# Patient Record
Sex: Female | Born: 1945 | Race: White | Hispanic: No | Smoking: Current every day smoker
Health system: Southern US, Community
[De-identification: ages and names within clinical notes are randomized; demographics above are authoritative.]

## PROBLEM LIST (undated history)

## (undated) DIAGNOSIS — E669 Obesity, unspecified: Secondary | ICD-10-CM

## (undated) DIAGNOSIS — M51369 Other intervertebral disc degeneration, lumbar region without mention of lumbar back pain or lower extremity pain: Secondary | ICD-10-CM

## (undated) DIAGNOSIS — I4510 Unspecified right bundle-branch block: Secondary | ICD-10-CM

## (undated) DIAGNOSIS — G250 Essential tremor: Secondary | ICD-10-CM

## (undated) DIAGNOSIS — F329 Major depressive disorder, single episode, unspecified: Secondary | ICD-10-CM

## (undated) DIAGNOSIS — I509 Heart failure, unspecified: Secondary | ICD-10-CM

## (undated) DIAGNOSIS — R269 Unspecified abnormalities of gait and mobility: Secondary | ICD-10-CM

## (undated) DIAGNOSIS — E559 Vitamin D deficiency, unspecified: Secondary | ICD-10-CM

## (undated) DIAGNOSIS — M79606 Pain in leg, unspecified: Secondary | ICD-10-CM

## (undated) DIAGNOSIS — E785 Hyperlipidemia, unspecified: Secondary | ICD-10-CM

## (undated) DIAGNOSIS — G629 Polyneuropathy, unspecified: Secondary | ICD-10-CM

## (undated) DIAGNOSIS — K08109 Complete loss of teeth, unspecified cause, unspecified class: Secondary | ICD-10-CM

## (undated) DIAGNOSIS — B0229 Other postherpetic nervous system involvement: Secondary | ICD-10-CM

## (undated) DIAGNOSIS — K635 Polyp of colon: Secondary | ICD-10-CM

## (undated) DIAGNOSIS — R600 Localized edema: Secondary | ICD-10-CM

## (undated) DIAGNOSIS — I10 Essential (primary) hypertension: Secondary | ICD-10-CM

## (undated) DIAGNOSIS — I739 Peripheral vascular disease, unspecified: Secondary | ICD-10-CM

## (undated) DIAGNOSIS — Z972 Presence of dental prosthetic device (complete) (partial): Secondary | ICD-10-CM

## (undated) DIAGNOSIS — G4700 Insomnia, unspecified: Secondary | ICD-10-CM

## (undated) DIAGNOSIS — Z720 Tobacco use: Secondary | ICD-10-CM

## (undated) DIAGNOSIS — M1990 Unspecified osteoarthritis, unspecified site: Secondary | ICD-10-CM

## (undated) DIAGNOSIS — F419 Anxiety disorder, unspecified: Secondary | ICD-10-CM

## (undated) DIAGNOSIS — M5136 Other intervertebral disc degeneration, lumbar region: Secondary | ICD-10-CM

## (undated) DIAGNOSIS — F32A Depression, unspecified: Secondary | ICD-10-CM

## (undated) DIAGNOSIS — N3941 Urge incontinence: Secondary | ICD-10-CM

## (undated) DIAGNOSIS — R7303 Prediabetes: Secondary | ICD-10-CM

## (undated) DIAGNOSIS — J449 Chronic obstructive pulmonary disease, unspecified: Secondary | ICD-10-CM

## (undated) DIAGNOSIS — M48062 Spinal stenosis, lumbar region with neurogenic claudication: Secondary | ICD-10-CM

## (undated) DIAGNOSIS — J189 Pneumonia, unspecified organism: Secondary | ICD-10-CM

## (undated) HISTORY — PX: BACK SURGERY: SHX140

---

## 1970-06-02 HISTORY — PX: ABDOMINAL HYSTERECTOMY: SHX81

## 2005-02-04 ENCOUNTER — Ambulatory Visit (HOSPITAL_COMMUNITY): Admission: RE | Admit: 2005-02-04 | Discharge: 2005-02-04 | Payer: Self-pay | Admitting: General Surgery

## 2005-02-12 ENCOUNTER — Encounter: Admission: RE | Admit: 2005-02-12 | Discharge: 2005-05-13 | Payer: Self-pay | Admitting: General Surgery

## 2005-02-13 ENCOUNTER — Ambulatory Visit (HOSPITAL_COMMUNITY): Admission: RE | Admit: 2005-02-13 | Discharge: 2005-02-13 | Payer: Self-pay | Admitting: General Surgery

## 2005-03-27 ENCOUNTER — Ambulatory Visit (HOSPITAL_COMMUNITY): Admission: RE | Admit: 2005-03-27 | Discharge: 2005-03-27 | Payer: Self-pay | Admitting: General Surgery

## 2009-06-02 HISTORY — PX: BREAST BIOPSY: SHX20

## 2010-10-06 ENCOUNTER — Emergency Department: Payer: Self-pay | Admitting: Internal Medicine

## 2011-04-13 ENCOUNTER — Emergency Department: Payer: Self-pay | Admitting: *Deleted

## 2011-07-15 ENCOUNTER — Ambulatory Visit: Payer: Self-pay | Admitting: Unknown Physician Specialty

## 2011-07-16 LAB — PATHOLOGY REPORT

## 2011-08-11 ENCOUNTER — Emergency Department: Payer: Self-pay | Admitting: Unknown Physician Specialty

## 2011-08-12 LAB — CBC WITH DIFFERENTIAL/PLATELET
Basophil %: 0.1 %
Eosinophil %: 0.4 %
HCT: 40.9 % (ref 35.0–47.0)
HGB: 13.9 g/dL (ref 12.0–16.0)
Lymphocyte #: 0.4 10*3/uL — ABNORMAL LOW (ref 1.0–3.6)
MCV: 87 fL (ref 80–100)
Monocyte %: 2.5 %
Neutrophil #: 4 10*3/uL (ref 1.4–6.5)
Platelet: 185 10*3/uL (ref 150–440)
RBC: 4.73 10*6/uL (ref 3.80–5.20)
WBC: 4.6 10*3/uL (ref 3.6–11.0)

## 2011-08-12 LAB — COMPREHENSIVE METABOLIC PANEL
Alkaline Phosphatase: 108 U/L (ref 50–136)
BUN: 13 mg/dL (ref 7–18)
Chloride: 100 mmol/L (ref 98–107)
Co2: 27 mmol/L (ref 21–32)
EGFR (African American): >60
EGFR (Non-African Amer.): >60
SGOT(AST): 24 U/L (ref 15–37)
SGPT (ALT): 27 U/L
Total Protein: 7.4 g/dL (ref 6.4–8.2)

## 2011-08-12 LAB — MAGNESIUM: Magnesium: 1.6 mg/dL — ABNORMAL LOW

## 2011-09-18 ENCOUNTER — Ambulatory Visit: Payer: Self-pay | Admitting: Internal Medicine

## 2012-09-20 ENCOUNTER — Ambulatory Visit: Payer: Self-pay | Admitting: Internal Medicine

## 2012-10-08 ENCOUNTER — Emergency Department: Payer: Self-pay | Admitting: Unknown Physician Specialty

## 2013-06-02 ENCOUNTER — Emergency Department: Payer: Self-pay | Admitting: Emergency Medicine

## 2013-06-02 LAB — TROPONIN I
Troponin-I: <0.02 ng/mL
Troponin-I: <0.02 ng/mL

## 2013-06-02 LAB — CBC
HCT: 39.9 % (ref 35.0–47.0)
HGB: 13.7 g/dL (ref 12.0–16.0)
MCH: 29.3 pg (ref 26.0–34.0)
MCHC: 34.4 g/dL (ref 32.0–36.0)
MCV: 85 fL (ref 80–100)
Platelet: 183 10*3/uL (ref 150–440)
RBC: 4.69 10*6/uL (ref 3.80–5.20)
RDW: 13.6 % (ref 11.5–14.5)
WBC: 4.6 10*3/uL (ref 3.6–11.0)

## 2013-06-02 LAB — BASIC METABOLIC PANEL
Anion Gap: 4 — ABNORMAL LOW (ref 7–16)
BUN: 8 mg/dL (ref 7–18)
CHLORIDE: 100 mmol/L (ref 98–107)
Calcium, Total: 8.8 mg/dL (ref 8.5–10.1)
Co2: 28 mmol/L (ref 21–32)
Creatinine: 0.67 mg/dL (ref 0.60–1.30)
EGFR (African American): >60
GLUCOSE: 88 mg/dL (ref 65–99)
Osmolality: 262 (ref 275–301)
POTASSIUM: 3.6 mmol/L (ref 3.5–5.1)
Sodium: 132 mmol/L — ABNORMAL LOW (ref 136–145)

## 2013-06-02 LAB — RAPID INFLUENZA A&B ANTIGENS (ARMC ONLY)

## 2013-06-02 LAB — PRO B NATRIURETIC PEPTIDE: B-TYPE NATIURETIC PEPTID: 272 pg/mL — AB (ref 0–125)

## 2013-09-01 ENCOUNTER — Emergency Department: Payer: Self-pay | Admitting: Emergency Medicine

## 2013-11-23 ENCOUNTER — Ambulatory Visit: Payer: Self-pay | Admitting: Internal Medicine

## 2013-12-05 DIAGNOSIS — M47816 Spondylosis without myelopathy or radiculopathy, lumbar region: Secondary | ICD-10-CM | POA: Insufficient documentation

## 2013-12-14 ENCOUNTER — Ambulatory Visit: Payer: Self-pay | Admitting: Unknown Physician Specialty

## 2014-01-02 ENCOUNTER — Ambulatory Visit: Payer: Self-pay | Admitting: Physical Medicine and Rehabilitation

## 2014-01-06 DIAGNOSIS — I10 Essential (primary) hypertension: Secondary | ICD-10-CM | POA: Insufficient documentation

## 2014-01-06 DIAGNOSIS — E669 Obesity, unspecified: Secondary | ICD-10-CM | POA: Insufficient documentation

## 2014-01-06 DIAGNOSIS — I739 Peripheral vascular disease, unspecified: Secondary | ICD-10-CM | POA: Insufficient documentation

## 2014-01-06 DIAGNOSIS — F325 Major depressive disorder, single episode, in full remission: Secondary | ICD-10-CM | POA: Insufficient documentation

## 2014-05-02 DIAGNOSIS — J189 Pneumonia, unspecified organism: Secondary | ICD-10-CM

## 2014-05-02 HISTORY — DX: Pneumonia, unspecified organism: J18.9

## 2014-06-26 ENCOUNTER — Emergency Department: Payer: Self-pay | Admitting: Emergency Medicine

## 2014-06-26 LAB — BASIC METABOLIC PANEL
Anion Gap: 6 — ABNORMAL LOW (ref 7–16)
BUN: 14 mg/dL (ref 7–18)
CALCIUM: 9.3 mg/dL (ref 8.5–10.1)
CHLORIDE: 99 mmol/L (ref 98–107)
Co2: 32 mmol/L (ref 21–32)
Creatinine: 0.68 mg/dL (ref 0.60–1.30)
EGFR (African American): >60
EGFR (Non-African Amer.): >60
Glucose: 117 mg/dL — ABNORMAL HIGH (ref 65–99)
Osmolality: 275 (ref 275–301)
POTASSIUM: 3.1 mmol/L — AB (ref 3.5–5.1)
Sodium: 137 mmol/L (ref 136–145)

## 2014-06-26 LAB — CBC
HCT: 46.2 % (ref 35.0–47.0)
HGB: 15.2 g/dL (ref 12.0–16.0)
MCH: 29 pg (ref 26.0–34.0)
MCHC: 32.9 g/dL (ref 32.0–36.0)
MCV: 88 fL (ref 80–100)
Platelet: 257 10*3/uL (ref 150–440)
RBC: 5.24 10*6/uL — AB (ref 3.80–5.20)
RDW: 13.6 % (ref 11.5–14.5)
WBC: 5.4 10*3/uL (ref 3.6–11.0)

## 2014-06-26 LAB — MAGNESIUM: Magnesium: 2 mg/dL

## 2014-06-28 DIAGNOSIS — R600 Localized edema: Secondary | ICD-10-CM | POA: Insufficient documentation

## 2014-07-17 ENCOUNTER — Ambulatory Visit: Payer: Self-pay | Admitting: Physical Medicine and Rehabilitation

## 2014-07-26 ENCOUNTER — Other Ambulatory Visit (HOSPITAL_COMMUNITY): Payer: Self-pay | Admitting: Neurosurgery

## 2014-08-14 ENCOUNTER — Encounter (HOSPITAL_COMMUNITY): Payer: Self-pay

## 2014-08-14 ENCOUNTER — Encounter (HOSPITAL_COMMUNITY)
Admission: RE | Admit: 2014-08-14 | Discharge: 2014-08-14 | Disposition: A | Discharge status: Home / Self Care | Payer: Commercial Managed Care - HMO | Source: Ambulatory Visit | Attending: Neurosurgery | Admitting: Neurosurgery

## 2014-08-14 HISTORY — DX: Depression, unspecified: F32.A

## 2014-08-14 HISTORY — DX: Unspecified osteoarthritis, unspecified site: M19.90

## 2014-08-14 HISTORY — DX: Pneumonia, unspecified organism: J18.9

## 2014-08-14 HISTORY — DX: Essential (primary) hypertension: I10

## 2014-08-14 HISTORY — DX: Major depressive disorder, single episode, unspecified: F32.9

## 2014-08-14 HISTORY — DX: Heart failure, unspecified: I50.9

## 2014-08-14 LAB — SURGICAL PCR SCREEN
MRSA, PCR: POSITIVE — AB
STAPHYLOCOCCUS AUREUS: POSITIVE — AB

## 2014-08-14 LAB — CBC
HEMATOCRIT: 43.9 % (ref 36.0–46.0)
HEMOGLOBIN: 14.5 g/dL (ref 12.0–15.0)
MCH: 29.2 pg (ref 26.0–34.0)
MCHC: 33 g/dL (ref 30.0–36.0)
MCV: 88.3 fL (ref 78.0–100.0)
Platelets: 274 10*3/uL (ref 150–400)
RBC: 4.97 MIL/uL (ref 3.87–5.11)
RDW: 13.7 % (ref 11.5–15.5)
WBC: 7.9 10*3/uL (ref 4.0–10.5)

## 2014-08-14 NOTE — Progress Notes (Addendum)
Anesthesia Chart Review:  Pt is 69 year old female scheduled for L3-4, L4-5 lumbar laminectomy/decompression/microdiscectomy on 08/23/2014 with Dr. Arnoldo Morale.   Cardiologist is Dr. Saralyn Pilar at [place]. PCP is Dr. Ouida Sills at [place].   PMH includes: HTN, diastolic CHF. Current smoker. BMI 34.   Preoperative labs reviewed.  BMET hemolyzed, will be repeated DOS.   EKG: pending from Dr. Saralyn Pilar' office.   Echo 06/28/2014 (in care everywhere): -normal LV systolic function with estimated EF = 55% -normal RV systolic function -mild mitral valve insufficiency -no valvular stenosis  Willeen Cass, FNP-BC [facility] Stay [facility]/Anesthesiology Phone: [phone] 08/14/2014 4:38 PM  EKG obtained from Dr. Tonette Bihari office.   EKG 05/31/2014: sinus rhythm with PACs. RBBB.   If no acute changes, and if BMET stable DOS, I anticipate pt can proceed with surgery as scheduled.   Willeen Cass, FNP-BC [facility] Short Stay Surgical Center/Anesthesiology Phone: [phone] 08/15/2014 1:44 PM

## 2014-08-14 NOTE — Progress Notes (Signed)
Req"d ekg, cardiac tests,office notes from [facility] clinic [place] dr Jennye Moccasin pcp dr Ouida Sills notes on chart

## 2014-08-14 NOTE — Pre-Procedure Instructions (Addendum)
Brittany Burke  08/14/2014   Your procedure is scheduled on:  08/23/14  Report to [facility] short stay admitting at 910 AM.  Call this number if you have problems the morning of surgery: [phone]   Remember:   Do not eat food or drink liquids after midnight.   Take these medicines the morning of surgery with A SIP OF WATER: amlodipine(norvasc), cymbalta, gabapentin, pain med if needed     STOP all herbel meds, nsaids (aleve,naproxen,advil,ibuprofen) 5 days prior to surgery starting 08/18/14 including vitamins, aspirin   Do not wear jewelry, make-up or nail polish.  Do not wear lotions, powders, or perfumes. You may wear deodorant.  Do not shave 48 hours prior to surgery. Men may shave face and neck.  Do not bring valuables to the hospital.  [facility] is not responsible                  for any belongings or valuables.               Contacts, dentures or bridgework may not be worn into surgery.  Leave suitcase in the car. After surgery it may be brought to your room.  For patients admitted to the hospital, discharge time is determined by your                treatment team.               Patients discharged the day of surgery will not be allowed to drive  home.  Name and phone number of your driver:   Special Instructions:  Special Instructions: [name] - Preparing for Surgery  Before surgery, you can play an important role.  Because skin is not sterile, your skin needs to be as free of germs as possible.  You can reduce the number of germs on you skin by washing with CHG (chlorahexidine gluconate) soap before surgery.  CHG is an antiseptic cleaner which kills germs and bonds with the skin to continue killing germs even after washing.  Please DO NOT use if you have an allergy to CHG or antibacterial soaps.  If your skin becomes reddened/irritated stop using the CHG and inform your nurse when you arrive at Short Stay.  Do not shave (including legs and underarms) for at least 48  hours prior to the first CHG shower.  You may shave your face.  Please follow these instructions carefully:   1.  Shower with CHG Soap the night before surgery and the morning of Surgery.  2.  If you choose to wash your hair, wash your hair first as usual with your normal shampoo.  3.  After you shampoo, rinse your hair and body thoroughly to remove the Shampoo.  4.  Use CHG as you would any other liquid soap.  You can apply chg directly  to the skin and wash gently with scrungie or a clean washcloth.  5.  Apply the CHG Soap to your body ONLY FROM THE NECK DOWN.  Do not use on open wounds or open sores.  Avoid contact with your eyes ears, mouth and genitals (private parts).  Wash genitals (private parts)       with your normal soap.  6.  Wash thoroughly, paying special attention to the area where your surgery will be performed.  7.  Thoroughly rinse your body with warm water from the neck down.  8.  DO NOT shower/wash with your normal soap after using and rinsing off  the CHG Soap.  9.  Pat yourself dry with a clean towel.            10.  Wear clean pajamas.            11.  Place clean sheets on your bed the night of your first shower and do not sleep with pets.  Day of Surgery  Do not apply any lotions/deodorants the morning of surgery.  Please wear clean clothes to the hospital/surgery center.   Please read over the following fact sheets that you were given: Pain Booklet, Coughing and Deep Breathing, MRSA Information and Surgical Site Infection Prevention

## 2014-08-22 MED ORDER — CEFAZOLIN SODIUM-DEXTROSE 2-3 GM-% IV SOLR
2.0000 g | INTRAVENOUS | Status: AC
  Administered 2014-08-23: 2 g via INTRAVENOUS
  Filled 2014-08-22: qty 50

## 2014-08-23 ENCOUNTER — Ambulatory Visit (HOSPITAL_COMMUNITY): Payer: Commercial Managed Care - HMO | Admitting: Anesthesiology

## 2014-08-23 ENCOUNTER — Encounter (HOSPITAL_COMMUNITY): Payer: Self-pay | Admitting: *Deleted

## 2014-08-23 ENCOUNTER — Ambulatory Visit (HOSPITAL_COMMUNITY): Payer: Commercial Managed Care - HMO

## 2014-08-23 ENCOUNTER — Inpatient Hospital Stay (HOSPITAL_COMMUNITY)
Admission: RE | Admit: 2014-08-23 | Discharge: 2014-08-29 | DRG: 519 | Disposition: A | Discharge status: Skilled Nursing Facility | Payer: Commercial Managed Care - HMO | Source: Ambulatory Visit | Attending: Neurosurgery | Admitting: Neurosurgery

## 2014-08-23 ENCOUNTER — Encounter (HOSPITAL_COMMUNITY)
Admission: RE | Disposition: A | Discharge status: Skilled Nursing Facility | Payer: Self-pay | Source: Ambulatory Visit | Attending: Neurosurgery

## 2014-08-23 ENCOUNTER — Ambulatory Visit (HOSPITAL_COMMUNITY): Payer: Commercial Managed Care - HMO | Admitting: Emergency Medicine

## 2014-08-23 DIAGNOSIS — F17210 Nicotine dependence, cigarettes, uncomplicated: Secondary | ICD-10-CM | POA: Diagnosis present

## 2014-08-23 DIAGNOSIS — Z79899 Other long term (current) drug therapy: Secondary | ICD-10-CM

## 2014-08-23 DIAGNOSIS — M4800 Spinal stenosis, site unspecified: Secondary | ICD-10-CM

## 2014-08-23 DIAGNOSIS — M4806 Spinal stenosis, lumbar region: Principal | ICD-10-CM | POA: Diagnosis present

## 2014-08-23 DIAGNOSIS — I5032 Chronic diastolic (congestive) heart failure: Secondary | ICD-10-CM | POA: Diagnosis present

## 2014-08-23 DIAGNOSIS — G9741 Accidental puncture or laceration of dura during a procedure: Secondary | ICD-10-CM | POA: Diagnosis not present

## 2014-08-23 DIAGNOSIS — M1990 Unspecified osteoarthritis, unspecified site: Secondary | ICD-10-CM | POA: Diagnosis present

## 2014-08-23 DIAGNOSIS — Y92234 Operating room of hospital as the place of occurrence of the external cause: Secondary | ICD-10-CM

## 2014-08-23 DIAGNOSIS — M48062 Spinal stenosis, lumbar region with neurogenic claudication: Secondary | ICD-10-CM | POA: Diagnosis present

## 2014-08-23 DIAGNOSIS — Z8701 Personal history of pneumonia (recurrent): Secondary | ICD-10-CM

## 2014-08-23 DIAGNOSIS — M5416 Radiculopathy, lumbar region: Secondary | ICD-10-CM | POA: Diagnosis present

## 2014-08-23 DIAGNOSIS — Z90710 Acquired absence of both cervix and uterus: Secondary | ICD-10-CM

## 2014-08-23 DIAGNOSIS — I10 Essential (primary) hypertension: Secondary | ICD-10-CM | POA: Diagnosis present

## 2014-08-23 DIAGNOSIS — M5106 Intervertebral disc disorders with myelopathy, lumbar region: Secondary | ICD-10-CM | POA: Diagnosis present

## 2014-08-23 DIAGNOSIS — F329 Major depressive disorder, single episode, unspecified: Secondary | ICD-10-CM | POA: Diagnosis present

## 2014-08-23 DIAGNOSIS — Y658 Other specified misadventures during surgical and medical care: Secondary | ICD-10-CM | POA: Diagnosis present

## 2014-08-23 HISTORY — PX: LUMBAR LAMINECTOMY/DECOMPRESSION MICRODISCECTOMY: SHX5026

## 2014-08-23 LAB — BASIC METABOLIC PANEL
ANION GAP: 11 (ref 5–15)
BUN: 9 mg/dL (ref 6–23)
CALCIUM: 9.3 mg/dL (ref 8.4–10.5)
CO2: 28 mmol/L (ref 19–32)
Chloride: 99 mmol/L (ref 96–112)
Creatinine, Ser: 0.55 mg/dL (ref 0.50–1.10)
GLUCOSE: 85 mg/dL (ref 70–99)
POTASSIUM: 3.9 mmol/L (ref 3.5–5.1)
SODIUM: 138 mmol/L (ref 135–145)

## 2014-08-23 LAB — GLUCOSE, CAPILLARY: GLUCOSE-CAPILLARY: 148 mg/dL — AB (ref 70–99)

## 2014-08-23 SURGERY — LUMBAR LAMINECTOMY/DECOMPRESSION MICRODISCECTOMY 2 LEVELS
Anesthesia: General | Site: Spine Lumbar

## 2014-08-23 MED ORDER — DULOXETINE HCL 60 MG PO CPEP
60.0000 mg | ORAL_CAPSULE | Freq: Two times a day (BID) | ORAL | Status: DC
  Administered 2014-08-24 – 2014-08-29 (×11): 60 mg via ORAL
  Filled 2014-08-23 (×3): qty 1
  Filled 2014-08-23: qty 2
  Filled 2014-08-23 (×8): qty 1

## 2014-08-23 MED ORDER — HYDROCODONE-ACETAMINOPHEN 5-325 MG PO TABS
1.0000 | ORAL_TABLET | ORAL | Status: DC | PRN
  Administered 2014-08-27 – 2014-08-29 (×2): 2 via ORAL
  Filled 2014-08-23 (×2): qty 2

## 2014-08-23 MED ORDER — MORPHINE SULFATE 2 MG/ML IJ SOLN: 1.0000 mg | INTRAMUSCULAR | Status: DC | PRN

## 2014-08-23 MED ORDER — DOCUSATE SODIUM 100 MG PO CAPS
100.0000 mg | ORAL_CAPSULE | Freq: Two times a day (BID) | ORAL | Status: DC
  Administered 2014-08-23 – 2014-08-29 (×12): 100 mg via ORAL
  Filled 2014-08-23 (×12): qty 1

## 2014-08-23 MED ORDER — LIDOCAINE HCL (CARDIAC) 20 MG/ML IV SOLN
INTRAVENOUS | Status: AC
  Filled 2014-08-23: qty 5

## 2014-08-23 MED ORDER — ARTIFICIAL TEARS OP OINT
TOPICAL_OINTMENT | OPHTHALMIC | Status: DC | PRN
  Administered 2014-08-23: 1 via OPHTHALMIC

## 2014-08-23 MED ORDER — LACTATED RINGERS IV SOLN
Freq: Once | INTRAVENOUS | Status: AC
  Administered 2014-08-23: 11:00:00 via INTRAVENOUS

## 2014-08-23 MED ORDER — BUPIVACAINE-EPINEPHRINE (PF) 0.5% -1:200000 IJ SOLN
INTRAMUSCULAR | Status: DC | PRN
  Administered 2014-08-23: 10 mL via PERINEURAL

## 2014-08-23 MED ORDER — ROCURONIUM BROMIDE 50 MG/5ML IV SOLN
INTRAVENOUS | Status: AC
  Filled 2014-08-23: qty 1

## 2014-08-23 MED ORDER — BUPIVACAINE LIPOSOME 1.3 % IJ SUSP
20.0000 mL | Freq: Once | INTRAMUSCULAR | Status: DC
Start: 2014-08-23 — End: 2014-08-28
  Filled 2014-08-23: qty 20

## 2014-08-23 MED ORDER — BACITRACIN ZINC 500 UNIT/GM EX OINT
TOPICAL_OINTMENT | CUTANEOUS | Status: DC | PRN
  Administered 2014-08-23: 1 via TOPICAL

## 2014-08-23 MED ORDER — TRAZODONE HCL 100 MG PO TABS
200.0000 mg | ORAL_TABLET | Freq: Every day | ORAL | Status: DC
  Administered 2014-08-23 – 2014-08-28 (×6): 200 mg via ORAL
  Filled 2014-08-23: qty 2
  Filled 2014-08-23: qty 4
  Filled 2014-08-23 (×4): qty 2

## 2014-08-23 MED ORDER — PROPOFOL 10 MG/ML IV BOLUS
INTRAVENOUS | Status: DC | PRN
  Administered 2014-08-23: 150 mg via INTRAVENOUS

## 2014-08-23 MED ORDER — LACTATED RINGERS IV SOLN
INTRAVENOUS | Status: DC
Start: 2014-08-23 — End: 2014-08-29
  Administered 2014-08-23: 22:00:00 via INTRAVENOUS

## 2014-08-23 MED ORDER — AMLODIPINE BESYLATE 5 MG PO TABS
5.0000 mg | ORAL_TABLET | Freq: Every day | ORAL | Status: DC
  Administered 2014-08-25 – 2014-08-29 (×4): 5 mg via ORAL
  Filled 2014-08-23 (×6): qty 1

## 2014-08-23 MED ORDER — PHENOL 1.4 % MT LIQD: 1.0000 | OROMUCOSAL | Status: DC | PRN

## 2014-08-23 MED ORDER — BISACODYL 10 MG RE SUPP
10.0000 mg | Freq: Every day | RECTAL | Status: DC | PRN
  Administered 2014-08-27: 10 mg via RECTAL
  Filled 2014-08-23: qty 1

## 2014-08-23 MED ORDER — ONDANSETRON HCL 4 MG/2ML IJ SOLN: 4.0000 mg | INTRAMUSCULAR | Status: DC | PRN

## 2014-08-23 MED ORDER — FENTANYL CITRATE 0.05 MG/ML IJ SOLN
INTRAMUSCULAR | Status: AC
  Filled 2014-08-23: qty 5

## 2014-08-23 MED ORDER — PHENYLEPHRINE 40 MCG/ML (10ML) SYRINGE FOR IV PUSH (FOR BLOOD PRESSURE SUPPORT)
PREFILLED_SYRINGE | INTRAVENOUS | Status: AC
  Filled 2014-08-23: qty 10

## 2014-08-23 MED ORDER — GABAPENTIN 100 MG PO CAPS
100.0000 mg | ORAL_CAPSULE | Freq: Three times a day (TID) | ORAL | Status: DC
  Administered 2014-08-23 – 2014-08-29 (×18): 100 mg via ORAL
  Filled 2014-08-23 (×18): qty 1

## 2014-08-23 MED ORDER — MENTHOL 3 MG MT LOZG: 1.0000 | LOZENGE | OROMUCOSAL | Status: DC | PRN

## 2014-08-23 MED ORDER — HEMOSTATIC AGENTS (NO CHARGE) OPTIME
TOPICAL | Status: DC | PRN
  Administered 2014-08-23: 1 via TOPICAL

## 2014-08-23 MED ORDER — ACETAMINOPHEN 650 MG RE SUPP: 650.0000 mg | RECTAL | Status: DC | PRN

## 2014-08-23 MED ORDER — LIDOCAINE HCL (CARDIAC) 20 MG/ML IV SOLN
INTRAVENOUS | Status: DC | PRN
  Administered 2014-08-23: 70 mg via INTRAVENOUS

## 2014-08-23 MED ORDER — THROMBIN 5000 UNITS EX SOLR
CUTANEOUS | Status: DC | PRN
  Administered 2014-08-23 (×2): 5000 [IU] via TOPICAL

## 2014-08-23 MED ORDER — ROCURONIUM BROMIDE 100 MG/10ML IV SOLN
INTRAVENOUS | Status: DC | PRN
  Administered 2014-08-23: 40 mg via INTRAVENOUS

## 2014-08-23 MED ORDER — ACETAMINOPHEN 325 MG PO TABS
650.0000 mg | ORAL_TABLET | ORAL | Status: DC | PRN
  Administered 2014-08-24: 650 mg via ORAL
  Filled 2014-08-23: qty 2

## 2014-08-23 MED ORDER — HYDROMORPHONE HCL 1 MG/ML IJ SOLN
INTRAMUSCULAR | Status: AC
  Administered 2014-08-23: 0.5 mg via INTRAVENOUS
  Filled 2014-08-23: qty 2

## 2014-08-23 MED ORDER — PROMETHAZINE HCL 25 MG/ML IJ SOLN
6.2500 mg | INTRAMUSCULAR | Status: DC | PRN
Start: 2014-08-23 — End: 2014-08-23

## 2014-08-23 MED ORDER — OXYCODONE-ACETAMINOPHEN 5-325 MG PO TABS
1.0000 | ORAL_TABLET | ORAL | Status: DC | PRN
  Administered 2014-08-23 – 2014-08-29 (×24): 2 via ORAL
  Filled 2014-08-23 (×25): qty 2

## 2014-08-23 MED ORDER — SODIUM CHLORIDE 0.9 % IR SOLN
Status: DC | PRN
  Administered 2014-08-23: 14:00:00

## 2014-08-23 MED ORDER — ALUM & MAG HYDROXIDE-SIMETH 200-200-20 MG/5ML PO SUSP: 30.0000 mL | Freq: Four times a day (QID) | ORAL | Status: DC | PRN

## 2014-08-23 MED ORDER — FENTANYL CITRATE 0.05 MG/ML IJ SOLN
INTRAMUSCULAR | Status: DC | PRN
  Administered 2014-08-23 (×3): 50 ug via INTRAVENOUS
  Administered 2014-08-23: 100 ug via INTRAVENOUS

## 2014-08-23 MED ORDER — LACTATED RINGERS IV SOLN
INTRAVENOUS | Status: DC | PRN
  Administered 2014-08-23 (×2): via INTRAVENOUS

## 2014-08-23 MED ORDER — BUPIVACAINE LIPOSOME 1.3 % IJ SUSP
INTRAMUSCULAR | Status: DC | PRN
  Administered 2014-08-23: 20 mL

## 2014-08-23 MED ORDER — CEFAZOLIN SODIUM-DEXTROSE 2-3 GM-% IV SOLR
2.0000 g | Freq: Three times a day (TID) | INTRAVENOUS | Status: AC
  Administered 2014-08-23 – 2014-08-24 (×2): 2 g via INTRAVENOUS
  Filled 2014-08-23 (×2): qty 50

## 2014-08-23 MED ORDER — ONDANSETRON HCL 4 MG/2ML IJ SOLN
INTRAMUSCULAR | Status: DC | PRN
  Administered 2014-08-23: 4 mg via INTRAVENOUS

## 2014-08-23 MED ORDER — PROPOFOL 10 MG/ML IV BOLUS
INTRAVENOUS | Status: AC
  Filled 2014-08-23: qty 20

## 2014-08-23 MED ORDER — DIAZEPAM 5 MG PO TABS
5.0000 mg | ORAL_TABLET | Freq: Four times a day (QID) | ORAL | Status: DC | PRN
  Administered 2014-08-23 – 2014-08-27 (×6): 5 mg via ORAL
  Filled 2014-08-23 (×5): qty 1

## 2014-08-23 MED ORDER — MIDAZOLAM HCL 5 MG/5ML IJ SOLN
INTRAMUSCULAR | Status: DC | PRN
  Administered 2014-08-23 (×2): 1 mg via INTRAVENOUS

## 2014-08-23 MED ORDER — GLYCOPYRROLATE 0.2 MG/ML IJ SOLN
INTRAMUSCULAR | Status: AC
  Filled 2014-08-23: qty 3

## 2014-08-23 MED ORDER — DIAZEPAM 5 MG PO TABS
ORAL_TABLET | ORAL | Status: AC
  Filled 2014-08-23: qty 1

## 2014-08-23 MED ORDER — POTASSIUM CHLORIDE CRYS ER 10 MEQ PO TBCR
10.0000 meq | EXTENDED_RELEASE_TABLET | Freq: Every day | ORAL | Status: DC
  Administered 2014-08-24 – 2014-08-29 (×6): 10 meq via ORAL
  Filled 2014-08-23 (×6): qty 1

## 2014-08-23 MED ORDER — 0.9 % SODIUM CHLORIDE (POUR BTL) OPTIME
TOPICAL | Status: DC | PRN
  Administered 2014-08-23: 1000 mL

## 2014-08-23 MED ORDER — GLYCOPYRROLATE 0.2 MG/ML IJ SOLN
INTRAMUSCULAR | Status: DC | PRN
  Administered 2014-08-23: 0.1 mg via INTRAVENOUS
  Administered 2014-08-23: 0.3 mg via INTRAVENOUS

## 2014-08-23 MED ORDER — FLEET ENEMA 7-19 GM/118ML RE ENEM: 1.0000 | ENEMA | Freq: Once | RECTAL | Status: AC | PRN

## 2014-08-23 MED ORDER — PHENYLEPHRINE HCL 10 MG/ML IJ SOLN
INTRAMUSCULAR | Status: DC | PRN
  Administered 2014-08-23: 80 ug via INTRAVENOUS

## 2014-08-23 MED ORDER — MIDAZOLAM HCL 2 MG/2ML IJ SOLN
INTRAMUSCULAR | Status: AC
  Filled 2014-08-23: qty 2

## 2014-08-23 MED ORDER — NEOSTIGMINE METHYLSULFATE 10 MG/10ML IV SOLN
INTRAVENOUS | Status: DC | PRN
  Administered 2014-08-23: 1 mg via INTRAVENOUS
  Administered 2014-08-23: 2 mg via INTRAVENOUS

## 2014-08-23 MED ORDER — HYDROMORPHONE HCL 1 MG/ML IJ SOLN
0.2500 mg | INTRAMUSCULAR | Status: DC | PRN
  Administered 2014-08-23 (×4): 0.5 mg via INTRAVENOUS

## 2014-08-23 MED ORDER — HYDROCHLOROTHIAZIDE 25 MG PO TABS
25.0000 mg | ORAL_TABLET | Freq: Every day | ORAL | Status: DC
  Administered 2014-08-24 – 2014-08-29 (×6): 25 mg via ORAL
  Filled 2014-08-23 (×7): qty 1

## 2014-08-23 MED ORDER — POTASSIUM GLUCONATE 595 (99 K) MG PO TABS: 595.0000 mg | ORAL_TABLET | Freq: Every day | ORAL | Status: DC

## 2014-08-23 SURGICAL SUPPLY — 61 items
APL SKNCLS STERI-STRIP NONHPOA (GAUZE/BANDAGES/DRESSINGS) ×1
APL SRG 60D 8 XTD TIP BNDBL (TIP) ×1
BAG DECANTER FOR FLEXI CONT (MISCELLANEOUS) ×3 IMPLANT
BENZOIN TINCTURE PRP APPL 2/3 (GAUZE/BANDAGES/DRESSINGS) ×3 IMPLANT
BLADE CLIPPER SURG (BLADE) IMPLANT
BRUSH SCRUB EZ PLAIN DRY (MISCELLANEOUS) ×3 IMPLANT
BUR MATCHSTICK NEURO 3.0 LAGG (BURR) ×3 IMPLANT
BUR PRECISION FLUTE 6.0 (BURR) ×3 IMPLANT
CANISTER SUCT 3000ML PPV (MISCELLANEOUS) ×3 IMPLANT
CLOSURE WOUND 1/2 X4 (GAUZE/BANDAGES/DRESSINGS) ×1
CONT SPEC 4OZ CLIKSEAL STRL BL (MISCELLANEOUS) ×3 IMPLANT
DRAPE LAPAROTOMY 100X72X124 (DRAPES) ×3 IMPLANT
DRAPE MICROSCOPE LEICA (MISCELLANEOUS) ×3 IMPLANT
DRAPE POUCH INSTRU U-SHP 10X18 (DRAPES) ×3 IMPLANT
DRAPE SURG 17X23 STRL (DRAPES) ×12 IMPLANT
DURASEAL APPLICATOR TIP (TIP) ×2 IMPLANT
DURASEAL SPINE SEALANT 3ML (MISCELLANEOUS) ×2 IMPLANT
ELECT BLADE 4.0 EZ CLEAN MEGAD (MISCELLANEOUS) ×3
ELECT REM PT RETURN 9FT ADLT (ELECTROSURGICAL) ×3
ELECTRODE BLDE 4.0 EZ CLN MEGD (MISCELLANEOUS) ×1 IMPLANT
ELECTRODE REM PT RTRN 9FT ADLT (ELECTROSURGICAL) ×1 IMPLANT
EVACUATOR 1/8 PVC DRAIN (DRAIN) ×2 IMPLANT
GAUZE SPONGE 4X4 12PLY STRL (GAUZE/BANDAGES/DRESSINGS) ×3 IMPLANT
GAUZE SPONGE 4X4 16PLY XRAY LF (GAUZE/BANDAGES/DRESSINGS) ×2 IMPLANT
GLOVE BIO SURGEON STRL SZ8 (GLOVE) ×5 IMPLANT
GLOVE BIO SURGEON STRL SZ8.5 (GLOVE) ×5 IMPLANT
GLOVE BIOGEL PI IND STRL 7.0 (GLOVE) IMPLANT
GLOVE BIOGEL PI INDICATOR 7.0 (GLOVE) ×6
GLOVE ECLIPSE 6.5 STRL STRAW (GLOVE) ×4 IMPLANT
GLOVE EXAM NITRILE LRG STRL (GLOVE) IMPLANT
GLOVE EXAM NITRILE MD LF STRL (GLOVE) IMPLANT
GLOVE EXAM NITRILE XL STR (GLOVE) IMPLANT
GLOVE EXAM NITRILE XS STR PU (GLOVE) IMPLANT
GOWN STRL REUS W/ TWL LRG LVL3 (GOWN DISPOSABLE) IMPLANT
GOWN STRL REUS W/ TWL XL LVL3 (GOWN DISPOSABLE) ×1 IMPLANT
GOWN STRL REUS W/TWL 2XL LVL3 (GOWN DISPOSABLE) IMPLANT
GOWN STRL REUS W/TWL LRG LVL3 (GOWN DISPOSABLE) ×3
GOWN STRL REUS W/TWL XL LVL3 (GOWN DISPOSABLE) ×6
KIT BASIN OR (CUSTOM PROCEDURE TRAY) ×3 IMPLANT
KIT ROOM TURNOVER OR (KITS) ×3 IMPLANT
NDL HYPO 21X1.5 SAFETY (NEEDLE) IMPLANT
NEEDLE HYPO 21X1.5 SAFETY (NEEDLE) IMPLANT
NEEDLE HYPO 22GX1.5 SAFETY (NEEDLE) ×3 IMPLANT
NS IRRIG 1000ML POUR BTL (IV SOLUTION) ×3 IMPLANT
PACK LAMINECTOMY NEURO (CUSTOM PROCEDURE TRAY) ×3 IMPLANT
PAD ARMBOARD 7.5X6 YLW CONV (MISCELLANEOUS) ×9 IMPLANT
PATTIES SURGICAL .5 X1 (DISPOSABLE) ×2 IMPLANT
PATTIES SURGICAL 1X1 (DISPOSABLE) ×2 IMPLANT
RUBBERBAND STERILE (MISCELLANEOUS) ×6 IMPLANT
SPONGE SURGIFOAM ABS GEL SZ50 (HEMOSTASIS) ×3 IMPLANT
STRIP CLOSURE SKIN 1/2X4 (GAUZE/BANDAGES/DRESSINGS) ×2 IMPLANT
SUT PROLENE 6 0 BV (SUTURE) ×8 IMPLANT
SUT VIC AB 1 CT1 18XBRD ANBCTR (SUTURE) ×2 IMPLANT
SUT VIC AB 1 CT1 8-18 (SUTURE) ×6
SUT VIC AB 2-0 CP2 18 (SUTURE) ×6 IMPLANT
SYR 20CC LL (SYRINGE) IMPLANT
SYR 20ML ECCENTRIC (SYRINGE) ×3 IMPLANT
TAPE CLOTH SURG 4X10 WHT LF (GAUZE/BANDAGES/DRESSINGS) ×2 IMPLANT
TOWEL OR 17X24 6PK STRL BLUE (TOWEL DISPOSABLE) ×3 IMPLANT
TOWEL OR 17X26 10 PK STRL BLUE (TOWEL DISPOSABLE) ×3 IMPLANT
WATER STERILE IRR 1000ML POUR (IV SOLUTION) ×3 IMPLANT

## 2014-08-23 NOTE — Transfer of Care (Signed)
Immediate Anesthesia Transfer of Care Note  Patient: Brittany Burke  Procedure(s) Performed: Procedure(s) with comments: LUMBAR THREE-FOUR, LUMBAR FOUR-FIVE LUMBAR LAMINECTOMY/DECOMPRESSION MICRODISCECTOMY 2 LEVELS (N/A) - L34 L45 Laminectomies  Patient Location: PACU  Anesthesia Type:General  Level of Consciousness: awake and alert   Airway & Oxygen Therapy: Patient Spontanous Breathing and Patient connected to nasal cannula oxygen  Post-op Assessment: Report given to RN, Post -op Vital signs reviewed and stable and Patient moving all extremities  Post vital signs: Reviewed and stable  Last Vitals:  Filed Vitals:   08/23/14 0912  BP: 130/68  Pulse: 71  Temp: 36.7 C  Resp: 20    Complications: No apparent anesthesia complications

## 2014-08-23 NOTE — Progress Notes (Signed)
Patient ID: Brittany Burke, female   DOB: 1945-09-06, 69 y.o.   MRN: [id number] Subjective:  The patient is alert and pleasant.  Objective: Vital signs in last 24 hours: Temp:  [98.1 F (36.7 C)-99 F (37.2 C)] 99 F (37.2 C) (03/23 1612) Pulse Rate:  [71-85] 85 (03/23 1612) Resp:  [20-24] 24 (03/23 1612) BP: (130-136)/(67-68) 136/67 mmHg (03/23 1612) SpO2:  [95 %-97 %] 95 % (03/23 1612) Weight:  [95.709 kg (211 lb)] 95.709 kg (211 lb) (03/23 0912)  Intake/Output from previous day:   Intake/Output this shift: Total I/O In: 1700 [I.V.:1700] Out: 300 [Blood:300]  Physical exam the patient is alert and pleasant. She is moving her lower extremities well.  Lab Results: No results for input(s): WBC, HGB, HCT, PLT in the last 72 hours. BMET  Recent Labs  08/23/14 0903  NA 138  K 3.9  CL 99  CO2 28  GLUCOSE 85  BUN 9  CREATININE 0.55  CALCIUM 9.3    Studies/Results: Dg Lumbar Spine 1 View  08/23/2014   CLINICAL DATA:  L3-4, L4-5.  EXAM: LUMBAR SPINE - 1 VIEW  COMPARISON:  01/02/2014  FINDINGS: Single cross-table lateral view of the lumbar spine demonstrates posterior surgical instruments directed at the L5 pedicles.  IMPRESSION: Intraoperative localization as above.   Electronically Signed   By: Rolm Baptise M.D.   On: 08/23/2014 15:37    Assessment/Plan: The patient is doing well. I spoke with her daughter. We'll keep her at bed rest with head of bed less than 25 overnight.      Raeshaun Simson D 08/23/2014, 4:36 PM

## 2014-08-23 NOTE — Progress Notes (Signed)
Pt arrived to 4N04 alert and oriented x4. Oriented to room. Daughter at bedside. Questions answered. Will continue to monitor. Bobbye Charleston, RN

## 2014-08-23 NOTE — Anesthesia Procedure Notes (Signed)
Procedure Name: Intubation Date/Time: 08/23/2014 12:13 PM Performed by: Susa Loffler Pre-anesthesia Checklist: Patient identified, Timeout performed, Emergency Drugs available, Suction available and Patient being monitored Patient Re-evaluated:Patient Re-evaluated prior to inductionOxygen Delivery Method: Circle system utilized Preoxygenation: Pre-oxygenation with 100% oxygen Intubation Type: IV induction Ventilation: Mask ventilation without difficulty and Oral airway inserted - appropriate to patient size Laryngoscope Size: Mac and 3 Grade View: Grade II Tube type: Oral Tube size: 7.0 mm Number of attempts: 1 Airway Equipment and Method: Stylet and Oral airway Placement Confirmation: ETT inserted through vocal cords under direct vision,  positive ETCO2 and breath sounds checked- equal and bilateral Secured at: 22 cm Tube secured with: Tape Dental Injury: Teeth and Oropharynx as per pre-operative assessment

## 2014-08-23 NOTE — Anesthesia Preprocedure Evaluation (Addendum)
Anesthesia Evaluation  Patient identified by MRN, date of birth, ID band Patient awake    Reviewed: Allergy & Precautions, NPO status , Patient's Chart, lab work & pertinent test results  Airway        Dental   Pulmonary Current Smoker,          Cardiovascular hypertension, +CHF     Neuro/Psych    GI/Hepatic   Endo/Other    Renal/GU      Musculoskeletal  (+) Arthritis -,   Abdominal   Peds  Hematology   Anesthesia Other Findings   Reproductive/Obstetrics                           Anesthesia Physical Anesthesia Plan  ASA: II  Anesthesia Plan: General   Post-op Pain Management:    Induction: Intravenous  Airway Management Planned: Oral ETT  Additional Equipment:   Intra-op Plan:   Post-operative Plan: Extubation in OR  Informed Consent: I have reviewed the patients History and Physical, chart, labs and discussed the procedure including the risks, benefits and alternatives for the proposed anesthesia with the patient or authorized representative who has indicated his/her understanding and acceptance.   Dental advisory given  Plan Discussed with: Anesthesiologist and Surgeon  Anesthesia Plan Comments:        Anesthesia Quick Evaluation

## 2014-08-23 NOTE — H&P (Signed)
Subjective: The patient is a 69 year old white female who has complained of back, buttock, and leg pain consistent with neurogenic claudication. She has failed medical management and was worked up with a lumbar MRI. This demonstrated spinal stenosis at L3-4 and L4-5. I discussed the various treatment options with the patient including surgery. She has decided to proceed with a lumbar laminectomy.   Past Medical History  Diagnosis Date  . Hypertension   . CHF (congestive heart failure)     ? on office note2 /16 pcp  . Pneumonia 12/15    hx  . Depression   . Arthritis     Past Surgical History  Procedure Laterality Date  . Abdominal hysterectomy  72    No Known Allergies  History  Substance Use Topics  . Smoking status: Current Every Day Smoker -- 1.00 packs/day for 38 years    Types: Cigarettes  . Smokeless tobacco: Not on file  . Alcohol Use: No    History reviewed. No pertinent family history. Prior to Admission medications   Medication Sig Start Date End Date Taking? Authorizing Provider  amLODipine (NORVASC) 5 MG tablet Take 5 mg by mouth daily.   Yes Historical Provider, MD  DULoxetine (CYMBALTA) 60 MG capsule Take 60 mg by mouth 2 (two) times daily.   Yes Historical Provider, MD  gabapentin (NEURONTIN) 100 MG capsule Take 100 mg by mouth 3 (three) times daily.   Yes Historical Provider, MD  hydrochlorothiazide (HYDRODIURIL) 25 MG tablet Take 25 mg by mouth daily.   Yes Historical Provider, MD  HYDROcodone-acetaminophen (NORCO/VICODIN) 5-325 MG per tablet Take 1 tablet by mouth 4 (four) times daily. Patient states she takes 4 every day per patient.   Yes Historical Provider, MD  potassium gluconate 595 MG TABS tablet Take 595 mg by mouth daily.   Yes Historical Provider, MD  traZODone (DESYREL) 100 MG tablet Take 200 mg by mouth at bedtime.   Yes Historical Provider, MD  azithromycin (ZITHROMAX) 250 MG tablet Take by mouth daily.    Historical Provider, MD     Review of  Systems  Positive ROS: As above  All other systems have been reviewed and were otherwise negative with the exception of those mentioned in the HPI and as above.  Objective: Vital signs in last 24 hours: Temp:  [98.1 F (36.7 C)] 98.1 F (36.7 C) (03/23 0912) Pulse Rate:  [71] 71 (03/23 0912) Resp:  [20] 20 (03/23 0912) BP: (130)/(68) 130/68 mmHg (03/23 0912) SpO2:  [97 %] 97 % (03/23 0912) Weight:  [95.709 kg (211 lb)] 95.709 kg (211 lb) (03/23 0912)  General Appearance: Alert, cooperative, no distress, Head: Normocephalic, without obvious abnormality, atraumatic Eyes: PERRL, conjunctiva/corneas clear, EOM's intact,    Ears: Normal  Throat: Normal  Neck: Supple, symmetrical, trachea midline, no adenopathy; thyroid: No enlargement/tenderness/nodules; no carotid bruit or JVD Back: Symmetric, no curvature, ROM normal, no CVA tenderness Lungs: Clear to auscultation bilaterally, respirations unlabored Heart: Regular rate and rhythm, no murmur, rub or gallop Abdomen: Soft, non-tender,, no masses, no organomegaly Extremities: Extremities normal, atraumatic, no cyanosis or edema Pulses: 2+ and symmetric all extremities Skin: Skin color, texture, turgor normal, no rashes or lesions  NEUROLOGIC:   Mental status: alert and oriented, no aphasia, good attention span, Fund of knowledge/ memory ok Motor Exam - grossly normal Sensory Exam - grossly normal Reflexes:  Coordination - grossly normal Gait - grossly normal Balance - grossly normal Cranial Nerves: I: smell Not tested  II: visual acuity  OS: Normal  OD: Normal   II: visual fields Full to confrontation  II: pupils Equal, round, reactive to light  III,VII: ptosis None  III,IV,VI: extraocular muscles  Full ROM  V: mastication Normal  V: facial light touch sensation  Normal  V,VII: corneal reflex  Present  VII: facial muscle function - upper  Normal  VII: facial muscle function - lower Normal  VIII: hearing Not tested  IX:  soft palate elevation  Normal  IX,X: gag reflex Present  XI: trapezius strength  5/5  XI: sternocleidomastoid strength 5/5  XI: neck flexion strength  5/5  XII: tongue strength  Normal    Data Review Lab Results  Component Value Date   WBC 7.9 08/14/2014   HGB 14.5 08/14/2014   HCT 43.9 08/14/2014   MCV 88.3 08/14/2014   PLT 274 08/14/2014   Lab Results  Component Value Date   NA 138 08/23/2014   K 3.9 08/23/2014   CL 99 08/23/2014   CO2 28 08/23/2014   BUN 9 08/23/2014   CREATININE 0.55 08/23/2014   GLUCOSE 85 08/23/2014   No results found for: INR, PROTIME  Assessment/Plan: L3-4 and L4-5 spinal stenosis, lumbago, lumbar radiculopathy, neurogenic claudication: I have discussed the situation with the patient. I have reviewed her imaging studies with her and pointed out the abnormalities. We have discussed the various treatment options including surgery. I have described the surgical treatment option of an L3-4 and L4-5 laminectomy. I have shown her surgical models. We have discussed the risks, benefits, alternatives, and likelihood of achieving goals with surgery. I have answered all the patient's questions. She has decided to proceed with surgery.   Gordan Grell D 08/23/2014 11:44 AM

## 2014-08-23 NOTE — Anesthesia Postprocedure Evaluation (Signed)
  Anesthesia Post-op Note  Patient: Brittany Burke  Procedure(s) Performed: Procedure(s) with comments: LUMBAR THREE-FOUR, LUMBAR FOUR-FIVE LUMBAR LAMINECTOMY/DECOMPRESSION MICRODISCECTOMY 2 LEVELS (N/A) - L34 L45 Laminectomies  Patient Location: PACU  Anesthesia Type: General   Level of Consciousness: awake, alert  and oriented  Airway and Oxygen Therapy: Patient Spontanous Breathing  Post-op Pain: moderate  Post-op Assessment: Post-op Vital signs reviewed  Post-op Vital Signs: Reviewed  Last Vitals:  Filed Vitals:   08/23/14 1700  BP: 95/55  Pulse: 78  Temp:   Resp: 13    Complications: No apparent anesthesia complications

## 2014-08-23 NOTE — Op Note (Signed)
Brief history: The patient is a 69 year old white female who has complained of back and bilateral leg pain consistent with neurogenic claudication. She has failed medical management and was worked up with a lumbar MRI. This demonstrated the patient had severe stenosis at L4-5 with more moderate stenosis at L2-3 and L3-4. I discussed the various treatment options with the patient including surgery. She has weighed the risks, benefits, and alternative surgery and decided to proceed with decompressive lumbar laminectomy.  Preoperative diagnosis: L2-3, L3-4 and L4-5 spinal stenosis, lumbago, lumbar radiculopathy, neurogenic claudication  Postoperative diagnosis: The same  Procedure: L3 and L4 laminectomy with L2 laminotomies to decompress the bowel L3, L4 and L5 nerve roots using micro-dissection  Surgeon: Dr. Earle Gell  Asst.: Dr. Kary Kos  Anesthesia: Gen. endotracheal  Estimated blood loss: 300 mL  Drains: One medium Hemovac in the epidural space  Complications: Incidental durotomy  Description of procedure: The patient was brought to the operating room by the anesthesia team. General endotracheal anesthesia was induced. The patient was turned to the prone position on the Wilson frame. The patient's lumbosacral region was then prepared with Betadine scrub and Betadine solution. Sterile drapes were applied.  I then injected the area to be incised with Marcaine with epinephrine solution. I then used a scalpel to make a linear midline incision over the L2-3, L3-4 and L4-5 intervertebral disc space. I then used electrocautery to perform a bilateral  subperiosteal dissection exposing the spinous process and lamina of L2, L3, L4 and L5. We obtained intraoperative radiograph to confirm our location. I then inserted the [facility] retractor for exposure. I incised the interspinous ligament at L2-3, L3-4 and L4-5 with a scalpel. I used the rongeurs to remove the spinous process of L3 and L4 as well  as the cephalad aspect of the L5 spinous process and the caudal aspect of the L2 spinous process.  We then brought the operative microscope into the field. Under its magnification and illumination we completed the microdissection. I used a high-speed drill to perform a laminotomy at L2, L3 and L4 bilaterally. I then used a Kerrison punches to complete the L3 and L4 laminectomy and to widen the lateral laminotomies at L2. and removed the ligamentum flavum at L2-3, L3-4 and L4-5. We then used microdissection to free up the thecal sac and the bilateral L3, L4 and L5 nerve root from the epidural tissue. I then used a Kerrison punch to perform a foraminotomy at about the bilateral L3, L4 and L5 nerve root. I inspected the intervertebral disc at L2-3, L3-4 and L4-5. There were no significant herniations. The ligamentum flavum and lamina were quite adherent to the dura particularly on the right side at L4-5, and L2-3 on the right. In doing the laminotomies we did encounter an incidental durotomy along the cephalad aspect of the L5 lamina on the right. I removed more of the cephalad aspect of the L5 lamina on the right to better expose the durotomy. I then reapproximated durotomy with interrupted and figure-of-eight 6-0 Prolene sutures. We then had anesthesia Valsalva the patient. The closure looked good.  I then palpated along the ventral surface of the thecal sac and along exit route of the bilateral L3, L4 and L5 nerve root and noted that the neural structures were well decompressed. This completed the decompression.  We then obtained hemostasis using bipolar electrocautery. We irrigated the wound out with bacitracin solution. I placed DuraSeal over the durotomy. We then removed the retractor. I placed a medium  Hemovac drain in the epidural space and tunneled it out through separate stab wound. We then reapproximated the patient's thoracolumbar fascia with interrupted #1 Vicryl suture. We then reapproximated the  patient's subcutaneous tissue with interrupted 2-0 Vicryl suture. We then reapproximated patient's skin with Steri-Strips and benzoin. The was then coated with bacitracin ointment. The drapes were removed. The patient was subsequently returned to the supine position where they were extubated by the anesthesia team. The patient was then transported to the postanesthesia care unit in stable condition. All sponge instrument and needle counts were reportedly correct at the end of this case.

## 2014-08-24 DIAGNOSIS — I5032 Chronic diastolic (congestive) heart failure: Secondary | ICD-10-CM | POA: Diagnosis present

## 2014-08-24 DIAGNOSIS — Z90710 Acquired absence of both cervix and uterus: Secondary | ICD-10-CM | POA: Diagnosis not present

## 2014-08-24 DIAGNOSIS — I10 Essential (primary) hypertension: Secondary | ICD-10-CM | POA: Diagnosis present

## 2014-08-24 DIAGNOSIS — G9741 Accidental puncture or laceration of dura during a procedure: Secondary | ICD-10-CM | POA: Diagnosis not present

## 2014-08-24 DIAGNOSIS — M5416 Radiculopathy, lumbar region: Secondary | ICD-10-CM | POA: Diagnosis present

## 2014-08-24 DIAGNOSIS — F329 Major depressive disorder, single episode, unspecified: Secondary | ICD-10-CM | POA: Diagnosis present

## 2014-08-24 DIAGNOSIS — M1990 Unspecified osteoarthritis, unspecified site: Secondary | ICD-10-CM | POA: Diagnosis present

## 2014-08-24 DIAGNOSIS — Y92234 Operating room of hospital as the place of occurrence of the external cause: Secondary | ICD-10-CM | POA: Diagnosis not present

## 2014-08-24 DIAGNOSIS — M4806 Spinal stenosis, lumbar region: Secondary | ICD-10-CM | POA: Diagnosis present

## 2014-08-24 DIAGNOSIS — F17210 Nicotine dependence, cigarettes, uncomplicated: Secondary | ICD-10-CM | POA: Diagnosis present

## 2014-08-24 DIAGNOSIS — Z79899 Other long term (current) drug therapy: Secondary | ICD-10-CM | POA: Diagnosis not present

## 2014-08-24 DIAGNOSIS — Z8701 Personal history of pneumonia (recurrent): Secondary | ICD-10-CM | POA: Diagnosis not present

## 2014-08-24 DIAGNOSIS — Y658 Other specified misadventures during surgical and medical care: Secondary | ICD-10-CM | POA: Diagnosis present

## 2014-08-24 DIAGNOSIS — M5106 Intervertebral disc disorders with myelopathy, lumbar region: Secondary | ICD-10-CM | POA: Diagnosis present

## 2014-08-24 MED ORDER — DOCUSATE SODIUM 100 MG PO CAPS: 100.0000 mg | ORAL_CAPSULE | Freq: Two times a day (BID) | ORAL | Status: DC

## 2014-08-24 MED ORDER — DIAZEPAM 5 MG PO TABS: 5.0000 mg | ORAL_TABLET | Freq: Four times a day (QID) | ORAL | Status: DC | PRN

## 2014-08-24 MED ORDER — MUPIROCIN 2 % EX OINT
1.0000 "application " | TOPICAL_OINTMENT | Freq: Two times a day (BID) | CUTANEOUS | Status: AC
  Administered 2014-08-24 – 2014-08-29 (×10): 1 via NASAL
  Filled 2014-08-24 (×2): qty 22

## 2014-08-24 MED ORDER — OXYCODONE-ACETAMINOPHEN 5-325 MG PO TABS: 1.0000 | ORAL_TABLET | ORAL | Status: DC | PRN

## 2014-08-24 NOTE — Progress Notes (Signed)
Pt did not void within 6 hour window per policy. Bladder scanned patient. Residual of 154mL. MD notified, foley ordered and placed. Bobbye Charleston, RN

## 2014-08-24 NOTE — Care Management Note (Unsigned)
    Page 1 of 1   08/25/2014     2:15:46 PM CARE MANAGEMENT NOTE 08/25/2014  Patient:  Brittany Burke, Brittany Burke   Account Number:  [id number]  Date Initiated:  08/24/2014  Documentation initiated by:  Lorne Skeens  Subjective/Objective Assessment:   Patient was admitted for L3-5 [place]. Lives at home with children.     Action/Plan:   Will follow for discharge needs pending PT/OT evals and physician orders.   Anticipated DC Date:     Anticipated DC Plan:  HOME/SELF CARE         Choice offered to / List presented to:             Status of service:  In process, will continue to follow Medicare Important Message given?  YES (If response is "NO", the following Medicare IM given date fields will be blank) Date Medicare IM given:  08/25/2014 Medicare IM given by:   Date Additional Medicare IM given:   Additional Medicare IM given by:    Discharge Disposition:    Per UR Regulation:  Reviewed for med. necessity/level of care/duration of stay  If discussed at [place] of Stay Meetings, dates discussed:    Comments:  08/25/14 pt given IM. Debbie Swist RN3 BSN CM

## 2014-08-24 NOTE — Progress Notes (Signed)
Patient ID: Brittany Burke, female   DOB: 06-17-1945, 69 y.o.   MRN: [id number] Subjective:  The patient is alert and pleasant. She wants to mobilize. She does not have a headache. She looks well. Her daughter is at her bedside.  Objective: Vital signs in last 24 hours: Temp:  [98 F (36.7 C)-99.3 F (37.4 C)] 99.3 F (37.4 C) (03/24 0915) Pulse Rate:  [64-93] 85 (03/24 0915) Resp:  [9-24] 18 (03/24 0915) BP: (94-136)/(43-81) 101/45 mmHg (03/24 0915) SpO2:  [91 %-99 %] 91 % (03/24 0915)  Intake/Output from previous day: 03/23 0701 - 03/24 0700 In: 1700 [I.V.:1700] Out: 950 [Urine:650; Blood:300] Intake/Output this shift:    Physical exam the patient is alert and pleasant. Her strength is normal in her bilateral gastrocnemius, and dorsiflexors. Her dressing is clean and dry.  Lab Results: No results for input(s): WBC, HGB, HCT, PLT in the last 72 hours. BMET  Recent Labs  08/23/14 0903  NA 138  K 3.9  CL 99  CO2 28  GLUCOSE 85  BUN 9  CREATININE 0.55  CALCIUM 9.3    Studies/Results: Dg Lumbar Spine 1 View  08/23/2014   CLINICAL DATA:  L3-4, L4-5.  EXAM: LUMBAR SPINE - 1 VIEW  COMPARISON:  01/02/2014  FINDINGS: Single cross-table lateral view of the lumbar spine demonstrates posterior surgical instruments directed at the L5 pedicles.  IMPRESSION: Intraoperative localization as above.   Electronically Signed   By: Rolm Baptise M.D.   On: 08/23/2014 15:37    Assessment/Plan: Postop day #1: The patient is doing well. We will mobilize her with PT. She may be able to go home tomorrow. I gave her discharge instructions, wrote her prescriptions and have answered all the patient's, and her daughter's questions.  LOS: 0 days     Deane Melick D 08/24/2014, 10:09 AM

## 2014-08-24 NOTE — Progress Notes (Signed)
UR complete.  Brendt Dible RN, MSN 

## 2014-08-24 NOTE — Evaluation (Addendum)
Physical Therapy Evaluation Patient Details Name: Brittany Burke MRN: [id number] DOB: 05/13/1946 Today's Date: 08/24/2014   History of Present Illness  Pt admit with L3-4 and L4-5 laminectomy and microdiscectomy.    Clinical Impression  Pt admitted with above diagnosis. Pt currently with functional limitations due to the deficits listed below (see PT Problem List). Pt had a lot of difficulty mobilizing today with weakness bil LEs with right >left weakness.  Pt lives alone and this PT feels that it would benefit pt to go to NH for short term rehab to recover after this surgery.  Family works and cannot provide 24 hour care.  Pt and daughter agree.  MD:  Please order OT consult for pt as she needs practice with ADLs and adaptive equipment.    Pt will benefit from skilled PT to increase their independence and safety with mobility to allow discharge to the venue listed below.      Follow Up Recommendations SNF;Supervision/Assistance - 24 hour    Equipment Recommendations  Rolling walker with 5" wheels    Recommendations for Other Services OT consult     Precautions / Restrictions Precautions Precautions: Fall;Back Precaution Booklet Issued: Yes (comment) Precaution Comments: educated on back precautions.  Restrictions Weight Bearing Restrictions: No      Mobility  Bed Mobility Overal bed mobility: Needs Assistance Bed Mobility: Rolling;Sidelying to Sit Rolling: Min assist Sidelying to sit: Min assist;Mod assist       General bed mobility comments: Cues and assist for log roll technique.  Once on EOB, pt crying due to pain.    Transfers Overall transfer level: Needs assistance Equipment used: Rolling walker (2 wheeled) Transfers: Sit to/from [facility] Sit to Stand: Mod assist Stand pivot transfers: Mod assist;Min assist       General transfer comment: Pt was able to stand with assist to power up.  Once up needing steadying assist as bil LE weakness noted  (R>L)  Ambulation/Gait Ambulation/Gait assistance: Min assist;Mod assist Ambulation Distance (Feet): 7 Feet Assistive device: Rolling walker (2 wheeled) Gait Pattern/deviations: Step-through pattern;Decreased stride length;Shuffle;Wide base of support;Trunk flexed   Gait velocity interpretation: Below normal speed for age/gender General Gait Details: Pt needed cues to stay close to RW as well as cues for postural stablity.  Needed close physical assist due to unsteady on feet due to bil LE weakness and pain.  Needed cues for safety awareness with RW as well as with stepping back to chair and reaching back for chair.    Stairs            Wheelchair Mobility    Modified Rankin (Stroke Patients Only)       Balance Overall balance assessment: Needs assistance         Standing balance support: Bilateral upper extremity supported;During functional activity Standing balance-Leahy Scale: Poor Standing balance comment: Requires UE support for balance.                               Pertinent Vitals/Pain Pain Assessment: 0-10 Pain Score: 10-Worst pain ever Pain Location: back and right LE Pain Descriptors / Indicators: Grimacing;Guarding;Burning;Aching;Radiating;Pressure Pain Intervention(s): Limited activity within patient's tolerance;Monitored during session;Repositioned;RN gave pain meds during session  VSS    Home Living Family/patient expects to be discharged to:: Private residence Living Arrangements: Alone Available Help at Discharge: Family;Available PRN/intermittently Type of Home: House Home Access: Level entry     Home Layout: One level Home  Equipment: Bedside commode Additional Comments: Pt has been struggling to ambulate for quite some time now per pt.    Prior Function Level of Independence: Needs assistance   Gait / Transfers Assistance Needed: Ambulation had been short distance as LEs weaker recently.   ADL's / Homemaking Assistance Needed:  Needed some help        Hand Dominance        Extremity/Trunk Assessment   Upper Extremity Assessment: Defer to OT evaluation           Lower Extremity Assessment: RLE deficits/detail;LLE deficits/detail RLE Deficits / Details: grossly 3-/5 with tightness in hamstrings LLE Deficits / Details: grossly 3/5      Communication   Communication: No difficulties  Cognition Arousal/Alertness: Awake/alert Behavior During Therapy: WFL for tasks assessed/performed Overall Cognitive Status: Within Functional Limits for tasks assessed                      General Comments General comments (skin integrity, edema, etc.): Daughter works and pt crying as she is unsure how she will care for herself at home.  Would benefit from therapy prior to d/c home for strengthening and education.      Exercises        Assessment/Plan    PT Assessment Patient needs continued PT services  PT Diagnosis Acute pain;Generalized weakness   PT Problem List Decreased activity tolerance;Decreased balance;Decreased mobility;Decreased strength;Decreased knowledge of use of DME;Decreased safety awareness;Decreased knowledge of precautions;Pain  PT Treatment Interventions DME instruction;Gait training;Functional mobility training;Therapeutic activities;Therapeutic exercise;Patient/family education;Balance training   PT Goals (Current goals can be found in the Care Plan section) Acute Rehab PT Goals Patient Stated Goal: to get rehab PT Goal Formulation: With patient Time For Goal Achievement: 09/07/14 Potential to Achieve Goals: Good    Frequency Min 5X/week   Barriers to discharge Decreased caregiver support      Co-evaluation               End of Session Equipment Utilized During Treatment: Gait belt Activity Tolerance: Patient limited by fatigue;Patient limited by pain Patient left: in chair;with call bell/phone within reach;with family/visitor present Nurse Communication: Mobility  status;Patient requests pain meds;Precautions         Time: [id number] PT Time Calculation (min) (ACUTE ONLY): 38 min   Charges:   PT Evaluation $Initial PT Evaluation Tier I: 1 Procedure PT Treatments $Gait Training: 8-22 mins $Self Care/Home Management: 8-22   PT G CodesDenice Paradise 09/21/2014, 3:07 PM Trice Aspinall,PT Acute Rehabilitation [phone] (pager)

## 2014-08-25 ENCOUNTER — Encounter (HOSPITAL_COMMUNITY): Payer: Self-pay | Admitting: General Practice

## 2014-08-25 NOTE — Progress Notes (Signed)
Patient ID: Brittany Burke, female   DOB: February 19, 1946, 69 y.o.   MRN: [id number] Vital signs are stable Motor function appears good in lower extremities Patient continues to have significant problems with cramping Dressing is dry and intact but has not been changed since surgery Continue to monitor patient encourage ambulation work with physical therapy

## 2014-08-25 NOTE — Progress Notes (Signed)
Physical Therapy Treatment Patient Details Name: Brittany Burke MRN: [id number] DOB: [birth year] Today's Date: 08/25/2014    History of Present Illness Pt admit with L3-4 and L4-5 laminectomy and microdiscectomy.      PT Comments    Patient with labile behavior this AM. Initially laughing sitting EOB then once standing, patient screaming out in pain. Patient educated on relaxation techniques. She is very anxious once up but we were able to settle her down and she was able to continue with ambulation. Continue to recommend SNF for ongoing Physical Therapy.     Follow Up Recommendations  SNF;Supervision/Assistance - 24 hour     Equipment Recommendations  Rolling walker with 5" wheels    Recommendations for Other Services       Precautions / Restrictions Precautions Precautions: Fall;Back Precaution Comments: educated on back precautions.     Mobility  Bed Mobility Overal bed mobility: Needs Assistance Bed Mobility: Rolling;Sidelying to Sit Rolling: Min assist Sidelying to sit: Min assist       General bed mobility comments: Cues and assist for log roll technique. A to elevate trunk into sitting  Transfers Overall transfer level: Needs assistance Equipment used: Rolling walker (2 wheeled)   Sit to Stand: Mod assist         General transfer comment: A to power up into standing and cues for safe technique. Stood x3 with cues for technique and hand placement. Patient tends to fall back into recliner  Ambulation/Gait Ambulation/Gait assistance: Min assist Ambulation Distance (Feet): 30 Feet Assistive device: Rolling walker (2 wheeled) Gait Pattern/deviations: Step-through pattern;Decreased stride length     General Gait Details: Patient required Min A  for balance and stability. Initially patient screaming and crying out with ambulation but utilized relaxation techniques and patient able to take sitting rest break and continue with ambulation. Chair to  follow   Stairs            Wheelchair Mobility    Modified Rankin (Stroke Patients Only)       Balance                                    Cognition Arousal/Alertness: Awake/alert Behavior During Therapy: WFL for tasks assessed/performed Overall Cognitive Status: Within Functional Limits for tasks assessed                      Exercises      General Comments        Pertinent Vitals/Pain Pain Score: 9  Pain Location: back and right LE Pain Descriptors / Indicators: Sore;Spasm Pain Intervention(s): Limited activity within patient's tolerance;Monitored during session    Home Living                      Prior Function            PT Goals (current goals can now be found in the care plan section) Progress towards PT goals: Progressing toward goals    Frequency  Min 5X/week    PT Plan Current plan remains appropriate    Co-evaluation             End of Session Equipment Utilized During Treatment: Gait belt Activity Tolerance: Patient limited by pain Patient left: in chair;with call bell/phone within reach;with chair alarm set     Time: [phone] PT Time Calculation (min) (ACUTE ONLY): 29 min  Charges:  $Gait  Training: 8-22 mins $Therapeutic Activity: 8-22 mins                    G Codes:      Jacqualyn Posey 08/25/2014, 1:23 PM 08/25/2014 Jacqualyn Posey PTA [phone] pager [phone] office

## 2014-08-25 NOTE — Clinical Social Work Note (Signed)
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   CSW awaiting a returned phone call from [facility]. CSW remains available as needed.  Glendon Axe, MSW, LCSWA [phone] 08/25/2014 3:47 PM

## 2014-08-25 NOTE — Clinical Social Work Psychosocial (Addendum)
Clinical Social Work Department BRIEF PSYCHOSOCIAL ASSESSMENT 08/25/2014  Patient:  Brittany Burke, ROSTAD     Account Number:  [id number]     Admit date:  08/23/2014  Clinical Social Worker:  Glendon Axe, CLINICAL SOCIAL WORKER  Date/Time:  08/25/2014 02:16 PM  Referred by:  Physician  Date Referred:  08/25/2014 Referred for  SNF Placement   Other Referral:   Interview type:  Patient Other interview type:   CSW met with patient at bedside.    PSYCHOSOCIAL DATA Living Status:  With DAUGHTER  Admitted from facility:  N/a Level of care:  N/a  Primary support name:  Rosemarie Beath Primary support relationship to patient:  CHILD, ADULT Degree of support available:   Strong    CURRENT CONCERNS Current Concerns  Post-Acute Placement   Other Concerns:    SOCIAL WORK ASSESSMENT / PLAN Clinical Social Worker met with patient at bedside in reference to post-acute placement for SNF. CSW introduced CSW role and SNF process. CSW reviewed and provided SNF list. Pt introduced her two friends at bedside and gave CSW permission to share SNF information with them as well. Pt reported at this time she is not interested in placement at a nursing home and stated she has a dog at home to care for. Pt further reported she and her dtr live together and that her dtr is at work for most of the day. Pt did confirm that she does not have 24 hour supervision at home however asked several questions in reference to [place] services. CSW explained that [place] services are only about 3 times per week. Pt stated her family and friends will check on her but will not be avaliable 24/7. CSW also suggested that pt ask friends and family to assist with dog care until she returns home for SNF. Pt reported she will think about it and contact CSW with further questions/ concerns. Pt's friends at bedside encouraged pt to think of herself for once and do what is best for total improvement/recovery. Pt agreed however stated  she would contact CSW if she was agreeable to SNF placement. Pt agreed to SNF search. CSW faxed pt's clinicals to both [facility] will continue to follow pt and pt's family for continued support and to facilitate pt's discharge needs once medically stable.   Assessment/plan status:  Psychosocial Support/Ongoing Assessment of Needs Other assessment/ plan:   FL-2 on chart for MD signature.   Information/referral to community resources:   SNF information/list provided.    PATIENT'S/FAMILY'S RESPONSE TO PLAN OF CARE: Pt alert and oriented X4. Pt lying in bed with friends at bedside. Pt reported she is currently undecided on SNF placement however will contact CSW if she changes her mind. Pt acknowledged she would fully benefit from SNF placement but is worried about leaving her dog and being away from home. Pt also stated she would like to work with PT/OT everyday while at [facility]. Pt agreed to meet with CSW again on Monday for final decision. Pt declining SNF placement at this time. CSW remains available.     Glendon Axe, MSW, LCSWA [phone] 08/25/2014 2:32 PM

## 2014-08-25 NOTE — Clinical Social Work Placement (Addendum)
Clinical Social Work Department CLINICAL SOCIAL WORK PLACEMENT NOTE 08/25/2014  Patient:  Brittany Burke, Brittany Burke  Account Number:  [id number] Admit date:  08/23/2014  Clinical Social Worker:  Glendon Axe, CLINICAL SOCIAL WORKER  Date/time:  08/25/2014 02:36 PM  Clinical Social Work is seeking post-discharge placement for this patient at the following level of care:   SKILLED NURSING   (*CSW will update this form in Epic as items are completed)   08/25/2014  Patient/family provided with [place] Department of Clinical Social Work's list of facilities offering this level of care within the geographic area requested by the patient (or if unable, by the patient's family).  08/25/2014  Patient/family informed of their freedom to choose among providers that offer the needed level of care, that participate in Medicare, Medicaid or managed care program needed by the patient, have an available bed and are willing to accept the patient.  08/25/2014  Patient/family informed of [facility]' ownership interest in [facility], as well as of the fact that they are under no obligation to receive care at this facility.  PASARR submitted to EDS on 08/25/2014 PASARR number received on 08/25/2014  FL2 transmitted to all facilities in geographic area requested by pt/family on  08/25/2014 FL2 transmitted to all facilities within larger geographic area on 08/25/2014  Patient informed that his/her managed care company has contracts with or will negotiate with  certain facilities, including the following:   YES     Patient/family informed of bed offers received:  08/28/2014 Patient chooses bed at [facility] Physician recommends and patient chooses bed at    Patient to be transferred to [facility]  on  08/29/2014 Patient to be transferred to facility by [facility] Patient and family notified of transfer on 08/29/2014 Name of family member notified:  Pt's dtr   The  following physician request were entered in Epic:   Additional Comments:   Glendon Axe, MSW, [place] [phone] 08/25/2014 2:37 PM

## 2014-08-26 NOTE — Evaluation (Addendum)
Occupational Therapy Evaluation Patient Details Name: Brittany Burke MRN: [id number] DOB: [birth year] Today's Date: 08/26/2014    History of Present Illness Pt admit with L3-4 and L4-5 laminectomy and microdiscectomy.     Clinical Impression   Pt s/p above. Recommending SNF at this time as pt does not have necessary assist available at home based on how she was moving in evaluation (Mod A for sit to stand from bed). Feel pt will benefit from acute OT to increase independence and strength prior to d/c.     Follow Up Recommendations  SNF    Equipment Recommendations  3 in 1 bedside comode;Other (comment) (AE)    Recommendations for Other Services       Precautions / Restrictions Precautions Precautions: Fall;Back Precaution Comments: educated on back precautions.  Restrictions Weight Bearing Restrictions: No      Mobility Bed Mobility Overal bed mobility: Needs Assistance Bed Mobility: Rolling;Sidelying to Sit Rolling: Supervision Sidelying to sit: Min assist       General bed mobility comments: assist to boost trunk. Cues for technique. Pt used rail. Min assist to scoot pt futher on EOB.  Transfers Overall transfer level: Needs assistance Equipment used: Rolling walker (2 wheeled) Transfers: Sit to/from Stand Sit to Stand: Mod assist;From elevated surface         General transfer comment: cues for technique/hand placement. Assist to boost from elevated bed.     Balance  Min guard for ambulation-did not ambulate far due to pain.                                          ADL Overall ADL's : Needs assistance/impaired     Grooming: Set up;Supervision/safety;Sitting   Upper Body Bathing: Set up;Supervision/ safety;Sitting   Lower Body Bathing: Maximal assistance;Sit to/from stand   Upper Body Dressing : Set up;Supervision/safety;Sitting   Lower Body Dressing: Maximal assistance;Sit to/from stand   Toilet Transfer: Min  guard;Ambulation;RW (from elevated bed to chair)           Functional mobility during ADLs: Min guard;Rolling walker General ADL Comments: Educated on [place]. Educated on use of cup for oral care and placement of grooming items to avoid breaking precautions. Explained that moving can help the pain.   Discussed 3 in 1.     Vision     Perception     Praxis      Pertinent Vitals/Pain Pain Assessment: 0-10 Pain Score:  (10 in right leg when moving and 10 in left leg when moving) Pain Location: legs Pain Intervention(s): Limited activity within patient's tolerance;Premedicated before session;Monitored during session;Relaxation     Hand Dominance     Extremity/Trunk Assessment Upper Extremity Assessment Upper Extremity Assessment: Overall WFL for tasks assessed   Lower Extremity Assessment Lower Extremity Assessment: Defer to PT evaluation       Communication Communication Communication: No difficulties   Cognition Arousal/Alertness: Awake/alert Behavior During Therapy: WFL for tasks assessed/performed Overall Cognitive Status: Within Functional Limits for tasks assessed                     General Comments       Exercises       Shoulder Instructions      Home Living Family/patient expects to be discharged to:: Private residence Living Arrangements: Alone Available Help at Discharge: Family;Available PRN/intermittently Type of Home: House Home Access: Level entry  Home Layout: One level     Bathroom Shower/Tub: Teacher, early years/pre: Standard         Additional Comments: Pt has been struggling to ambulate for quite some time now per pt.      Prior Functioning/Environment Level of Independence: Needs assistance  Gait / Transfers Assistance Needed: Ambulation had been short distance as LEs weaker recently.  ADL's / Homemaking Assistance Needed: Needed some help        OT Diagnosis: Acute pain;Generalized weakness   OT  Problem List: Decreased strength;Decreased activity tolerance;Pain;Decreased knowledge of precautions;Decreased knowledge of use of DME or AE   OT Treatment/Interventions: Self-care/ADL training;DME and/or AE instruction;Therapeutic activities;Patient/family education;Balance training    OT Goals(Current goals can be found in the care plan section) Acute Rehab OT Goals Patient Stated Goal: get back to [place] and walking OT Goal Formulation: With patient Time For Goal Achievement: 09/02/14 Potential to Achieve Goals: Good ADL Goals Pt Will Perform Grooming: with set-up;standing Pt Will Perform Lower Body Bathing: with set-up;with adaptive equipment;sit to/from stand;with supervision Pt Will Perform Lower Body Dressing: with set-up;with adaptive equipment;sit to/from stand;with supervision Pt Will Transfer to Toilet: with supervision;ambulating (3 in 1 over commode) Pt Will Perform Toileting - Clothing Manipulation and hygiene: sit to/from stand;with set-up;with supervision  OT Frequency: Min 2X/week   Barriers to D/C:            Co-evaluation              End of Session Equipment Utilized During Treatment: Gait belt;Rolling walker;Other (comment) (placed O2 back on at end of session) Nurse Communication: Mobility status;Other (comment) (pt in chair with chair alarm set)  Activity Tolerance: Patient limited by pain Patient left: in chair;with call bell/phone within reach;with chair alarm set   Time: [phone] OT Time Calculation (min): 16 min Charges:  OT General Charges $OT Visit: 1 Procedure OT Evaluation $Initial OT Evaluation Tier I: 1 Procedure G-CodesBenito Mccreedy OTR/L [phone] 08/26/2014, 11:33 AM

## 2014-08-26 NOTE — Progress Notes (Signed)
No issues overnight. Pt continues to c/o back and right leg pain. Able to get up to bathroom today.  EXAM:  BP 136/73 mmHg  Pulse 84  Temp(Src) 98.4 F (36.9 C) (Oral)  Resp 16  Ht 5\' 6"  (1.676 m)  Wt 95.709 kg (211 lb)  BMI 34.07 kg/m2  SpO2 95%  Awake, alert, oriented  Moving leg with good strength  IMPRESSION:  69 y.o. female POD# 3 s/p lumbar laminectomy, progressing slowly with postoperative pain  PLAN: - Cont to mobilize as tolerated - Plan on transfer to SNF when bed available.

## 2014-08-27 NOTE — Progress Notes (Signed)
Patient ID: Brittany Burke, female   DOB: [birth year], 69 y.o.   MRN: [id number] Vital signs are stable Motor function seems to be improving Patient complaining of bilateral aching in the legs Has been encouraged for further ambulation Discharge planning per Dr. Arnoldo Morale.

## 2014-08-28 ENCOUNTER — Encounter (HOSPITAL_COMMUNITY): Payer: Self-pay | Admitting: Neurosurgery

## 2014-08-28 NOTE — Progress Notes (Signed)
No issues overnight. She does continue to c/o pain in both legs limiting her ability to walk. She is voiding normally.  EXAM:  BP 126/79 mmHg  Pulse 82  Temp(Src) 97.7 F (36.5 C) (Oral)  Resp 20  Ht 5\' 6"  (1.676 m)  Wt 95.709 kg (211 lb)  BMI 34.07 kg/m2  SpO2 95%  Awake, alert, oriented  Speech fluent, appropriate  CN grossly intact  Good strength  IMPRESSION:  69 y.o. female POD# 5 s/p lumbar laminectomy, continued leg pain. Needs SNF placement  PLAN: - Cont to mobilize with PT/OT - Medically stable for transfer to SNF when bed available.

## 2014-08-28 NOTE — Clinical Social Work Note (Signed)
Clinical Social Worker met with patient to present bed offers and patient expressed she would like to return home with Home Health services. CSW reviewed SNF services including 24/7 nursing care and frequent PT/OT and reiterated that patient has reported several times that she does not have assistance at home. Patient agreed and became tearful when choosing a bed [facility] sharing that her mother was there in the past. Patient reported her mother passed about 1 year ago and that she wishes her mom with here with her.   Patient further reported she has family that lives near by facility to come visit.   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 CSW also contacted [facility] RN Liaison to report patient's acceptance of bed offer. [facility] Liaison to meet with patient at bedside. Patient and RN notified.  CSW attempted to contact pt's dtr Robin with discharge plans.   CSW will continue to follow pt for continued support and to facility pt's discharge needs once medically stable.   FL-2 on chart for MD signature.   Glendon Axe, MSW, LCSWA [phone] 08/28/2014 10:37 AM

## 2014-08-28 NOTE — Clinical Social Work Note (Signed)
Updated PT/OT notes sent to SNF, [facility]. [facility] RN Liaison currently at bedside with patient. RN notified.  CSW remains available.   Glendon Axe, MSW, LCSWA [phone] 08/28/2014 4:12 PM

## 2014-08-28 NOTE — Progress Notes (Signed)
Physical Therapy Treatment Patient Details Name: Brittany Burke MRN: [id number] DOB: 05-09-1946 Today's Date: 08/28/2014    History of Present Illness Pt admit with L3-4 and L4-5 laminectomy and microdiscectomy.      PT Comments    Patient is progressing slowly but well with ambulation. No screaming out in pain this session and emotions better controlled. Continue to recommend SNF for ongoing Physical Therapy.     Follow Up Recommendations  SNF;Supervision/Assistance - 24 hour     Equipment Recommendations  Rolling walker with 5" wheels    Recommendations for Other Services       Precautions / Restrictions Precautions Precautions: Fall;Back Precaution Comments: educated on back precautions.     Mobility  Bed Mobility Overal bed mobility: Needs Assistance Bed Mobility: Rolling;Sidelying to Sit Rolling: Supervision         General bed mobility comments: assist to boost trunk. Cues for technique. Pt used rail.  Transfers Overall transfer level: Needs assistance Equipment used: Rolling walker (2 wheeled)   Sit to Stand: Mod assist;From elevated surface         General transfer comment: cues for technique/hand placement. Assist to boost from elevated bed.   Ambulation/Gait Ambulation/Gait assistance: Min assist Ambulation Distance (Feet): 30 Feet (x2) Assistive device: Rolling walker (2 wheeled) Gait Pattern/deviations: Step-through pattern;Decreased stride length   Gait velocity interpretation: Below normal speed for age/gender General Gait Details: Min A to ensure safety and positioning with RW. Cues for upright posture. Chair to follow.    Stairs            Wheelchair Mobility    Modified Rankin (Stroke Patients Only)       Balance                                    Cognition Arousal/Alertness: Awake/alert Behavior During Therapy: WFL for tasks assessed/performed Overall Cognitive Status: Within Functional Limits for tasks  assessed                      Exercises      General Comments        Pertinent Vitals/Pain      Home Living                      Prior Function            PT Goals (current goals can now be found in the care plan section) Progress towards PT goals: Progressing toward goals    Frequency  Min 5X/week    PT Plan Current plan remains appropriate    Co-evaluation             End of Session Equipment Utilized During Treatment: Gait belt Activity Tolerance: Patient tolerated treatment well Patient left: in chair;with call bell/phone within reach     Time: 1024-1049 PT Time Calculation (min) (ACUTE ONLY): 25 min  Charges:  $Gait Training: 8-22 mins $Therapeutic Exercise: 8-22 mins                    G Codes:      Jacqualyn Posey 08/28/2014, 1:12 PM 08/28/2014 Jacqualyn Posey PTA [phone] pager [phone] office

## 2014-08-29 MED ORDER — OXYCODONE-ACETAMINOPHEN 5-325 MG PO TABS: 1.0000 | ORAL_TABLET | ORAL | Status: DC | PRN

## 2014-08-29 MED ORDER — DOCUSATE SODIUM 100 MG PO CAPS: 100.0000 mg | ORAL_CAPSULE | Freq: Two times a day (BID) | ORAL | Status: DC

## 2014-08-29 MED ORDER — DIAZEPAM 5 MG PO TABS: 5.0000 mg | ORAL_TABLET | Freq: Four times a day (QID) | ORAL | Status: DC | PRN

## 2014-08-29 NOTE — Progress Notes (Signed)
Report given to Karen Kitchens at [facility]

## 2014-08-29 NOTE — Discharge Instructions (Signed)
Anterior and Posterior Decompressions with Fusions Decompression is a procedure performed to relieve symptoms caused by pressure or compression on the spinal cord and nerve roots. The spinal cord is a cord of nervous tissue. It extends from the brain and passes through the spinal canal (passage formed by the openings in the backbone). Nerves branch out from the spinal cord to different parts of the body. Vertebrae (backbones) are a group of individual bones. They form the spinal column. Each vertebra is made up of lamina (bony arches of the spinal canal), spine, and foramen (opening in the backbone). A disc is a soft, gel-like cushion between each vertebra.  Decompression can be carried out as an anterior or posterior procedure. Anterior decompression is where the surgeon makes an incision more toward the front of your body in order to get access to the area needing repair. Posterior decompression is where the surgeon makes an incision more toward the back of your body in order to get access to the area needing repair. Depending upon the details of your specific condition, there are reasons why the surgeon may choose one approach over the other. CAUSES Compression of spinal cord and nerves can be caused by:  Bulging or collapse of the spinal disc.  Loosening of the ligaments.  Bony growth. The causes of the spinal cord compression include:  Degenerative diseases (such as with many arthritis problems).  Rupture of the disc.  Traumatic injury.  Spinal stenosis (narrowing of the spinal canal).  Pott disease (tuberculosis of the spine). LET YOUR CAREGIVER KNOW ABOUT:   Bleeding and clotting problems.  Allergy to anesthesia medications.  Allergy to medications like steroid and blood thinners.  Previous surgeries.  Bone diseases like disease of low bone mass (osteoporosis) and infection of bone (osteomyelitis).  Cancerous (neoplastic) conditions. RISKS AND COMPLICATIONS  Bleeding and  collection of blood outside the blood vessels.  Damage to the blood vessels. This can cause heavy bleeding and even death.  Damage to the nerves. This can cause pain, loss of sensation, paralysis, and weakness.  Damage to the covering of the spinal cord (meninges). This can cause the cerebrospinal fluid to leak.  Complications involving graft and plate.  Inadequate fusion.  Wound infection. BEFORE THE PROCEDURE  Your caregiver will make sure that you have been given a reasonable trial of nonsurgical treatment methods. Your caregiver may perform an X-ray study with a dye (diskogram). The injection of a dye into a disc may produce a pain similar to your back pain. This will help your caregiver to identify the disc that is the source of pain.  PROCEDURE Your surgeon will perform spinal decompression and fusion while you are under general anesthesia. There are different methods of performing decompression surgery. They include the following:  Diskectomy. Your surgeon will remove a portion of a spinal disc that is causing pressure on the nerve roots.  Laminotomy or laminectomy. Lamina refers to bony arches of the spinal canal. Laminotomy is a procedure in which your surgeon removes a small portion of lamina. Laminectomy is the removal of an entire lamina.  Foraminotomy or foraminectomy. Foramen refers to the opening in the backbone for nerve roots. Foraminotomy is the removal of a small amount of bone and tissue forming the foramen. Foraminectomy is the removal of a large amount of bone and tissue.  Osteophyte removal. Your surgeon removes bony growths called osteophytes. These cause pressure on the spinal column.  Corpectomy. Corpus refers to the body of vertebra. This procedure involves  the removal of the body of a vertebra and also the discs.  Spinal fusion is required if there is a curvature of the spine or forward slip of a vertebra. Spinal fusion is a procedure of fusing two or more  vertebrae together with a bone graft (new bone or substitute material from your body). This is further strengthened by using screws, plate, and cage apparatus.  Fusion prevents motion between 2 vertebrae. It also prevents the curvature of the spine and slip of vertebra from getting worse after surgery. AFTER THE PROCEDURE   You will be moved to the recovery room and then to the hospital room.  You will have a thin tube (catheter) inserted into the urinary bladder to help in urination.  Your may have pain for the first few days after the surgery. Your caregiver will give you medications to control pain.  Your caregiver may order blood tests to monitor oxygen carrying protein of the red blood cells (hemoglobin). This would be due to blood loss during the surgery. Hemoglobin should be monitored to make sure that the level of blood oxygen does not become too low.  You will be given a back brace. A back brace limits motion and helps fusion of the bone.  You may continue to have mild pain even after full recovery.  You may have a series X-ray examinations over time. This will ensure adequate healing and appropriate alignment at the site of operation. HOME CARE INSTRUCTIONS   To get strength and function, start physical therapy, occupational therapy, and exercises.  To ease the pain, you may have to exercise regularly. That also helps in weight loss.  To keep your spine in proper alignment, you should sit, stand, walk, turn in bed, and reposition yourself as instructed.  At first, take only short walks. Slowly increase other activities.  Avoid smoking. Nicotine inhibits fusion of the bone.  If narcotics (pain medication) are prescribed, you should not drink alcohol. You should not drive when you are on this medication because you will feel drowsy.  Avoid use of pain medication products and nonsteroidal anti-inflammatory agents (pain medication). They interfere with the development and growth  of new bone cells.  Your caregiver may recommend using ice to manage pain. Ask your caregiver for instructions on how to do it.  Avoid lifting heavy objects.  Avoid bending and twisting motions. SEEK MEDICAL CARE IF:   You have increased pain.  There is expanding redness at the operative site.  You have a fever. SEEK IMMEDIATE MEDICAL CARE IF:   You have any discomfort with the substitute material that has been used during the spinal fusion procedure.  You feel a collection of blood outside the blood vessels.  You notice any changes in the smell, appearance, or amount of drainage. Document Released: 06/08/2007 Document Revised: 10/03/2013 Document Reviewed: 10/09/2007 [facility] Patient Information 2015 [place], [place]. This information is not intended to replace advice given to you by your health care provider. Make sure you discuss any questions you have with your health care provider.

## 2014-08-29 NOTE — Discharge Summary (Signed)
  Physician Discharge Summary  Patient ID: Brittany Burke MRN: [id number] DOB/AGE: 69/22/1947 68 y.o.  Admit date: 08/23/2014 Discharge date: 08/29/2014  Admission Diagnoses: Lumbar stenosis with claudication  Discharge Diagnoses: Same Active Problems:   Lumbar stenosis with neurogenic claudication   Discharged Condition: Stable  Hospital Course:  Mrs. Brittany Burke is a 69 y.o. female electively admitted after uncomplicated lumbar laminectomy. She had a somewhat slow but uncomplicated postoperative course, with ambulation and ADLs limited primarily by back and leg pain. She was evaluated by PT and OT and thought to require transfer to SNF. She was ambulating with assistance, tolerating diet, voiding normally.  Treatments: Surgery - lumbar laminectomy  Discharge Exam: Blood pressure 133/61, pulse 86, temperature 99.5 F (37.5 C), temperature source Oral, resp. rate 18, height 5\' 6"  (1.676 m), weight 95.709 kg (211 lb), SpO2 97 %. Awake, alert, oriented Speech fluent, appropriate CN grossly intact 5/5 BUE/BLE Wound c/d/i  Follow-up: Follow-up in Dr. Arnoldo Morale' office [facility] [phone]) in 3 weeks  Disposition: SNF    Medication List    STOP taking these medications        azithromycin 250 MG tablet  Commonly known as:  ZITHROMAX     HYDROcodone-acetaminophen 5-325 MG per tablet  Commonly known as:  NORCO/VICODIN      TAKE these medications        amLODipine 5 MG tablet  Commonly known as:  NORVASC  Take 5 mg by mouth daily.     diazepam 5 MG tablet  Commonly known as:  VALIUM  Take 1 tablet (5 mg total) by mouth every 6 (six) hours as needed for muscle spasms.     docusate sodium 100 MG capsule  Commonly known as:  COLACE  Take 1 capsule (100 mg total) by mouth 2 (two) times daily.     DULoxetine 60 MG capsule  Commonly known as:  CYMBALTA  Take 60 mg by mouth 2 (two) times daily.     gabapentin 100 MG capsule  Commonly known  as:  NEURONTIN  Take 100 mg by mouth 3 (three) times daily.     hydrochlorothiazide 25 MG tablet  Commonly known as:  HYDRODIURIL  Take 25 mg by mouth daily.     oxyCODONE-acetaminophen 5-325 MG per tablet  Commonly known as:  PERCOCET/ROXICET  Take 1-2 tablets by mouth every 4 (four) hours as needed for moderate pain.     potassium gluconate 595 MG Tabs tablet  Take 595 mg by mouth daily.     traZODone 100 MG tablet  Commonly known as:  DESYREL  Take 200 mg by mouth at bedtime.         SignedConsuella Lose, C 08/29/2014, 3:56 PM

## 2014-08-29 NOTE — Clinical Social Work Note (Signed)
Clinical Social Worker has received Biochemist, clinical from [place] (authoization # [phone]). Patient has a bed at [facility] once medically stable. Auth information has been related to facility. CSW sent updated clinicals to facility as well.   CSW continues to follow pt for medical readiness. FL-2 on chart for MD signature.   Glendon Axe, MSW, LCSWA [phone] 08/29/2014 12:06 PM

## 2014-08-29 NOTE — Clinical Social Work Note (Signed)
Clinical Social Worker facilitated patient discharge including contacting patient family and facility to confirm patient discharge plans.  Clinical information faxed to facility and family agreeable with plan.  CSW arranged ambulance transport via [facility] to [facility]  RN to call report prior to discharge.  DC packet prepared and on chart for transport.  Clinical Social Worker will sign off for now as social work intervention is no longer needed. Please consult [place] again if new need arises.  Glendon Axe, MSW, LCSWA [phone] 08/29/2014 4:31 PM

## 2014-08-29 NOTE — Progress Notes (Signed)
Patient discharged to SNF via [facility] ambulance. Neuro intact. IV removed. Belongings given to transporters. Prescriptions and discharge summary packet given to transporters.

## 2014-08-29 NOTE — Progress Notes (Signed)
Physical Therapy Treatment Patient Details Name: Brittany Burke MRN: [id number] DOB: Apr 24, 1946 Today's Date: 08/29/2014    History of Present Illness Pt admit with L3-4 and L4-5 laminectomy and microdiscectomy.      PT Comments    Patient up in recliner and ready to return to bed. Patient agreeable to ambulation prior to returning to bed. Patient able to walk well without seated rest break today. Continue to recommend SNF for ongoing Physical Therapy.     Follow Up Recommendations  SNF;Supervision/Assistance - 24 hour     Equipment Recommendations  Rolling walker with 5" wheels    Recommendations for Other Services       Precautions / Restrictions Precautions Precautions: Fall;Back Precaution Comments: Patient able to recall all precautions Restrictions Weight Bearing Restrictions: No    Mobility  Bed Mobility Overal bed mobility: Needs Assistance Bed Mobility: Sit to Supine Rolling: Min guard Sidelying to sit: Min guard   Sit to supine: Min guard   General bed mobility comments: Cues for no twisting. HOB flat and no rails. Increased effort for LEs back into bed.   Transfers Overall transfer level: Needs assistance Equipment used: Rolling walker (2 wheeled) Transfers: Sit to/from Stand Sit to Stand: Min assist Stand pivot transfers: Min assist       General transfer comment: cues for technique/hand placement. Assist to boost off recliner lower seat.   Ambulation/Gait Ambulation/Gait assistance: Min guard Ambulation Distance (Feet): 60 Feet Assistive device: Rolling walker (2 wheeled) Gait Pattern/deviations: Step-through pattern   Gait velocity interpretation: Below normal speed for age/gender General Gait Details: Cues for upright posture and to decrease weight on LEs.    Stairs            Wheelchair Mobility    Modified Rankin (Stroke Patients Only)       Balance                                    Cognition  Arousal/Alertness: Awake/alert Behavior During Therapy: WFL for tasks assessed/performed Overall Cognitive Status: Within Functional Limits for tasks assessed                      Exercises      General Comments        Pertinent Vitals/Pain Pain Assessment: No/denies pain    Home Living                      Prior Function            PT Goals (current goals can now be found in the care plan section) Progress towards PT goals: Progressing toward goals    Frequency  Min 5X/week    PT Plan Current plan remains appropriate    Co-evaluation             End of Session Equipment Utilized During Treatment: Gait belt Activity Tolerance: Patient tolerated treatment well Patient left: with call bell/phone within reach;in bed     Time: 6270-3500 PT Time Calculation (min) (ACUTE ONLY): 15 min  Charges:  $Gait Training: 8-22 mins                    G Codes:      Jacqualyn Posey 08/29/2014, 11:09 AM  08/29/2014 Jacqualyn Posey PTA [phone] pager [phone] office

## 2014-08-29 NOTE — Clinical Social Work Note (Signed)
Patient reported she has changed her mind about placement at [facility] and chooses at bed at [place]. [place] notified and [employer] Rep, Amy with changes.   CSW to contact [facility] with changes.   CSW will continue to follow pt for continued support and to facilitate pt's discharge needs once medically stable.   Glendon Axe, MSW, LCSWA [phone] 08/29/2014 11:00 AM

## 2014-08-29 NOTE — Progress Notes (Signed)
Occupational Therapy Treatment Patient Details Name: Brittany Burke MRN: [id number] DOB: 05/03/1946 Today's Date: 08/29/2014    History of present illness Pt admit with L3-4 and L4-5 laminectomy and microdiscectomy.     OT comments  Pt. Progressing well with acute OT goals.  Able to complete bed mobility and toileting at MIN A level.  Eager for d/c to snf and is adamant regarding which one.  Only wants [facility] in [place].  SW contacted to review pts. Request.   Follow Up Recommendations  SNF , [facility] in [place]    Equipment Recommendations       Recommendations for Other Services      Precautions / Restrictions Precautions Precautions: Fall;Back Precaution Comments: educated on back precautions.        Mobility Bed Mobility Overal bed mobility: Needs Assistance Bed Mobility: Rolling;Sidelying to Sit Rolling: Min guard Sidelying to sit: Min guard       General bed mobility comments: HOB flat, used rail, cues for log roll technique.  pt. intially hesitant for [facility] flat "why are you doing this im going to a rehab place so i can have the bed up a little" reviewed that even at the rehab place the goal is to promote home environment set up and she needs to get used to regular bed set up   Transfers Overall transfer level: Needs assistance Equipment used: Rolling walker (2 wheeled) Transfers: Sit to/from [facility] Sit to Stand: Min assist Stand pivot transfers: Min assist       General transfer comment: cues for technique/hand placement. Assist to boost off of regular bed height    Balance                                   ADL Overall ADL's : Needs assistance/impaired                     Lower Body Dressing: Maximal assistance;Sit to/from stand Lower Body Dressing Details (indicate cue type and reason): may benefit from use of a/e Toilet Transfer: Min guard;RW;Ambulation;BSC;Comfort height toilet;Grab  bars Toilet Transfer Details (indicate cue type and reason): cues to complete transfer prior to sitting down Toileting- Clothing Manipulation and Hygiene: Min guard;Sitting/lateral lean       Functional mobility during ADLs: Min guard;Rolling walker General ADL Comments: reviwed use of A/E as pt. remains limitied with LEs. eager for d/c to snf and can continue with a/e there.        Vision                     Perception     Praxis      Cognition   Behavior During Therapy: WFL for tasks assessed/performed Overall Cognitive Status: Within Functional Limits for tasks assessed                       Extremity/Trunk Assessment               Exercises     Shoulder Instructions       General Comments      Pertinent Vitals/ Pain       Pain Assessment: No/denies pain  Home Living  Prior Functioning/Environment              Frequency Min 2X/week     Progress Toward Goals  OT Goals(current goals can now be found in the care plan section)  Progress towards OT goals: Progressing toward goals     Plan Discharge plan remains appropriate    Co-evaluation                 End of Session Equipment Utilized During Treatment: Rolling walker   Activity Tolerance Patient tolerated treatment well   Patient Left in chair;with call bell/phone within reach   Nurse Communication Other (comment) (contacted SW to review pts. preference of snf for d/c)        Time: 0822-0837 OT Time Calculation (min): 15 min  Charges: OT General Charges $OT Visit: 1 Procedure OT Treatments $Self Care/Home Management : 8-22 mins  Hanae Coffin, COTA/L 08/29/2014, 8:48 AM

## 2014-09-05 NOTE — Addendum Note (Signed)
Addendum  created 09/05/14 1616 by Finis Bud, MD   Modules edited: Anesthesia Attestations

## 2014-09-26 ENCOUNTER — Emergency Department
Admit: 2014-09-26 | Disposition: A | Discharge status: Home / Self Care | Payer: Self-pay | Admitting: Emergency Medicine

## 2014-09-26 LAB — COMPREHENSIVE METABOLIC PANEL
ALK PHOS: 107 U/L
Albumin: 3.8 g/dL
Anion Gap: 6 — ABNORMAL LOW (ref 7–16)
BILIRUBIN TOTAL: 0.5 mg/dL
BUN: 14 mg/dL
CHLORIDE: 99 mmol/L — AB
CO2: 34 mmol/L — AB
CREATININE: 0.47 mg/dL
Calcium, Total: 8.6 mg/dL — ABNORMAL LOW
EGFR (African American): >60
Glucose: 114 mg/dL — ABNORMAL HIGH
POTASSIUM: 3.3 mmol/L — AB
SGOT(AST): 23 U/L
SGPT (ALT): 24 U/L
Sodium: 139 mmol/L
Total Protein: 6.9 g/dL

## 2014-09-26 LAB — URINALYSIS, COMPLETE
Bacteria: NONE SEEN
Bilirubin,UR: NEGATIVE
Blood: NEGATIVE
Glucose,UR: NEGATIVE mg/dL (ref 0–75)
Ketone: NEGATIVE
LEUKOCYTE ESTERASE: NEGATIVE
Nitrite: NEGATIVE
PROTEIN: NEGATIVE
Ph: 6 (ref 4.5–8.0)
Specific Gravity: 1.024 (ref 1.003–1.030)

## 2014-09-26 LAB — CBC
HCT: 39.4 % (ref 35.0–47.0)
HGB: 13 g/dL (ref 12.0–16.0)
MCH: 28.5 pg (ref 26.0–34.0)
MCHC: 32.9 g/dL (ref 32.0–36.0)
MCV: 87 fL (ref 80–100)
Platelet: 283 10*3/uL (ref 150–440)
RBC: 4.55 10*6/uL (ref 3.80–5.20)
RDW: 15 % — AB (ref 11.5–14.5)
WBC: 4 10*3/uL (ref 3.6–11.0)

## 2014-09-26 LAB — SEDIMENTATION RATE: Erythrocyte Sed Rate: 18 mm/hr (ref 0–30)

## 2014-10-11 DIAGNOSIS — M19012 Primary osteoarthritis, left shoulder: Secondary | ICD-10-CM | POA: Insufficient documentation

## 2014-10-20 DIAGNOSIS — E559 Vitamin D deficiency, unspecified: Secondary | ICD-10-CM | POA: Insufficient documentation

## 2014-10-20 DIAGNOSIS — F17200 Nicotine dependence, unspecified, uncomplicated: Secondary | ICD-10-CM | POA: Insufficient documentation

## 2014-10-20 DIAGNOSIS — G4700 Insomnia, unspecified: Secondary | ICD-10-CM | POA: Insufficient documentation

## 2014-10-20 DIAGNOSIS — B0229 Other postherpetic nervous system involvement: Secondary | ICD-10-CM | POA: Insufficient documentation

## 2014-10-20 DIAGNOSIS — N3941 Urge incontinence: Secondary | ICD-10-CM | POA: Insufficient documentation

## 2014-10-20 DIAGNOSIS — F419 Anxiety disorder, unspecified: Secondary | ICD-10-CM | POA: Insufficient documentation

## 2014-10-20 DIAGNOSIS — R269 Unspecified abnormalities of gait and mobility: Secondary | ICD-10-CM | POA: Insufficient documentation

## 2014-10-20 DIAGNOSIS — F3341 Major depressive disorder, recurrent, in partial remission: Secondary | ICD-10-CM | POA: Insufficient documentation

## 2015-01-23 ENCOUNTER — Other Ambulatory Visit: Payer: Self-pay | Admitting: Geriatric Medicine

## 2015-01-23 DIAGNOSIS — Z1231 Encounter for screening mammogram for malignant neoplasm of breast: Secondary | ICD-10-CM

## 2015-02-08 ENCOUNTER — Ambulatory Visit
Admission: RE | Admit: 2015-02-08 | Discharge: 2015-02-08 | Disposition: A | Discharge status: Home / Self Care | Payer: Medicare (Managed Care) | Source: Ambulatory Visit | Attending: Geriatric Medicine | Admitting: Geriatric Medicine

## 2015-02-08 ENCOUNTER — Other Ambulatory Visit: Payer: Self-pay | Admitting: Geriatric Medicine

## 2015-02-08 DIAGNOSIS — Z1231 Encounter for screening mammogram for malignant neoplasm of breast: Secondary | ICD-10-CM

## 2015-04-19 ENCOUNTER — Ambulatory Visit: Payer: PRIVATE HEALTH INSURANCE | Admitting: Anesthesiology

## 2015-04-19 ENCOUNTER — Encounter: Payer: Self-pay | Admitting: *Deleted

## 2015-04-19 ENCOUNTER — Ambulatory Visit
Admission: RE | Admit: 2015-04-19 | Discharge: 2015-04-19 | Disposition: A | Discharge status: Home / Self Care | Payer: PRIVATE HEALTH INSURANCE | Source: Ambulatory Visit | Attending: Unknown Physician Specialty | Admitting: Unknown Physician Specialty

## 2015-04-19 ENCOUNTER — Encounter
Admission: RE | Disposition: A | Discharge status: Home / Self Care | Payer: Self-pay | Source: Ambulatory Visit | Attending: Unknown Physician Specialty

## 2015-04-19 DIAGNOSIS — Z90710 Acquired absence of both cervix and uterus: Secondary | ICD-10-CM | POA: Diagnosis not present

## 2015-04-19 DIAGNOSIS — Z86010 Personal history of colonic polyps: Secondary | ICD-10-CM | POA: Diagnosis present

## 2015-04-19 DIAGNOSIS — D122 Benign neoplasm of ascending colon: Secondary | ICD-10-CM | POA: Diagnosis not present

## 2015-04-19 DIAGNOSIS — K640 First degree hemorrhoids: Secondary | ICD-10-CM | POA: Insufficient documentation

## 2015-04-19 DIAGNOSIS — F17210 Nicotine dependence, cigarettes, uncomplicated: Secondary | ICD-10-CM | POA: Insufficient documentation

## 2015-04-19 DIAGNOSIS — Z9889 Other specified postprocedural states: Secondary | ICD-10-CM | POA: Diagnosis not present

## 2015-04-19 DIAGNOSIS — K635 Polyp of colon: Secondary | ICD-10-CM | POA: Insufficient documentation

## 2015-04-19 DIAGNOSIS — D123 Benign neoplasm of transverse colon: Secondary | ICD-10-CM | POA: Insufficient documentation

## 2015-04-19 DIAGNOSIS — Z79899 Other long term (current) drug therapy: Secondary | ICD-10-CM | POA: Diagnosis not present

## 2015-04-19 DIAGNOSIS — I110 Hypertensive heart disease with heart failure: Secondary | ICD-10-CM | POA: Insufficient documentation

## 2015-04-19 DIAGNOSIS — D120 Benign neoplasm of cecum: Secondary | ICD-10-CM | POA: Diagnosis not present

## 2015-04-19 DIAGNOSIS — I509 Heart failure, unspecified: Secondary | ICD-10-CM | POA: Insufficient documentation

## 2015-04-19 DIAGNOSIS — Z79891 Long term (current) use of opiate analgesic: Secondary | ICD-10-CM | POA: Diagnosis not present

## 2015-04-19 DIAGNOSIS — F329 Major depressive disorder, single episode, unspecified: Secondary | ICD-10-CM | POA: Insufficient documentation

## 2015-04-19 HISTORY — PX: COLONOSCOPY WITH PROPOFOL: SHX5780

## 2015-04-19 SURGERY — COLONOSCOPY WITH PROPOFOL
Anesthesia: General

## 2015-04-19 MED ORDER — PROPOFOL 500 MG/50ML IV EMUL
INTRAVENOUS | Status: DC | PRN
  Administered 2015-04-19: 140 ug/kg/min via INTRAVENOUS

## 2015-04-19 MED ORDER — SODIUM CHLORIDE 0.9 % IV SOLN
INTRAVENOUS | Status: DC
  Administered 2015-04-19 (×2): via INTRAVENOUS

## 2015-04-19 MED ORDER — SODIUM CHLORIDE 0.9 % IV SOLN: INTRAVENOUS | Status: DC

## 2015-04-19 MED ORDER — PROPOFOL 10 MG/ML IV BOLUS
INTRAVENOUS | Status: DC | PRN
  Administered 2015-04-19: 80 mg via INTRAVENOUS

## 2015-04-19 NOTE — Op Note (Signed)
[facility] Gastroenterology Patient Name: Brittany Burke Procedure Date: 04/19/2015 12:14 PM MRN: [id number] Account #: [id number] Date of Birth: 02/23/1946 Admit Type: Outpatient Age: 69 Room: [facility] ENDO ROOM 1 Gender: Female Note Status: Finalized Procedure:         Colonoscopy Indications:       High risk colon cancer surveillance: Personal history of                     colonic polyps Providers:         Manya Silvas, MD Referring MD:      No Local Md, MD (Referring MD) Medicines:         Propofol per Anesthesia Complications:     No immediate complications. Procedure:         Pre-Anesthesia Assessment:                    - After reviewing the risks and benefits, the patient was                     deemed in satisfactory condition to undergo the procedure.                    After obtaining informed consent, the colonoscope was                     passed under direct vision. Throughout the procedure, the                     patient's blood pressure, pulse, and oxygen saturations                     were monitored continuously. The Colonoscope was                     introduced through the anus and advanced to the the cecum,                     identified by appendiceal orifice and ileocecal valve. The                     colonoscopy was performed without difficulty. The patient                     tolerated the procedure well. The quality of the bowel                     preparation was good. Findings:      A small polyp was found in the cecum. The polyp was sessile. The polyp       was removed with a hot snare. Resection and retrieval were complete. To       prevent bleeding after the polypectomy, two hemostatic clips were       successfully placed. There was no bleeding during, and at the end, of       the procedure.      A small polyp was found in the ascending colon. The polyp was sessile.       The polyp was removed with a hot snare. Resection and  retrieval were       complete. To prevent bleeding after the polypectomy, one hemostatic clip       was successfully placed. There was no bleeding during, and at the end,       of  the procedure.      Three sessile polyps were found in the transverse colon. The polyps were       diminutive in size. These polyps were removed with a jumbo cold forceps.       Resection and retrieval were complete.      A diminutive polyp was found in the sigmoid colon. The polyp was       sessile. The polyp was removed with a jumbo cold forceps. Resection and       retrieval were complete.      Internal hemorrhoids were found during endoscopy. The hemorrhoids were       small and Grade I (internal hemorrhoids that do not prolapse). Impression:        - One small polyp in the cecum. Resected and retrieved.                     Clips were placed.                    - One small polyp in the ascending colon. Resected and                     retrieved. Clip was placed.                    - Three diminutive polyps in the transverse colon.                     Resected and retrieved.                    - One diminutive polyp in the sigmoid colon. Resected and                     retrieved.                    - Internal hemorrhoids. Recommendation:    - Await pathology results. Manya Silvas, MD 04/19/2015 1:16:16 PM This report has been signed electronically. Number of Addenda: 0 Note Initiated On: 04/19/2015 12:14 PM Scope Withdrawal Time: 0 hours 23 minutes 23 seconds  Total Procedure Duration: 0 hours 29 minutes 37 seconds       [facility]

## 2015-04-19 NOTE — Transfer of Care (Signed)
Immediate Anesthesia Transfer of Care Note  Patient: Brittany Burke  Procedure(s) Performed: Procedure(s): COLONOSCOPY WITH PROPOFOL (N/A)  Patient Location: PACU  Anesthesia Type:General  Level of Consciousness: awake, alert  and sedated  Airway & Oxygen Therapy: Patient Spontanous Breathing and Patient connected to nasal cannula oxygen  Post-op Assessment: Report given to RN and Post -op Vital signs reviewed and stable  Post vital signs: Reviewed  Last Vitals:  Filed Vitals:   04/19/15 1043  BP: 150/77  Pulse: 83  Temp: 37.3 C  Resp: 14    Complications: No apparent anesthesia complications

## 2015-04-19 NOTE — Anesthesia Preprocedure Evaluation (Signed)
Anesthesia Evaluation  Patient identified by MRN, date of birth, ID band Patient awake    Reviewed: Allergy & Precautions, H&P , NPO status , Patient's Chart, lab work & pertinent test results, reviewed documented beta blocker date and time   History of Anesthesia Complications Negative for: history of anesthetic complications  Airway Mallampati: III  TM Distance: >3 FB Neck ROM: full    Dental no notable dental hx. (+) Edentulous Upper, Edentulous Lower, Upper Dentures, Lower Dentures   Pulmonary neg shortness of breath, neg sleep apnea, pneumonia, resolved, neg COPD, neg recent URI, Current Smoker,    Pulmonary exam normal breath sounds clear to auscultation       Cardiovascular Exercise Tolerance: Good hypertension, On Medications (-) angina+CHF  (-) CAD, (-) Past MI, (-) Cardiac Stents and (-) CABG Normal cardiovascular exam(-) dysrhythmias (-) Valvular Problems/Murmurs Rhythm:regular Rate:Normal     Neuro/Psych PSYCHIATRIC DISORDERS (Depression) negative neurological ROS     GI/Hepatic negative GI ROS, Neg liver ROS,   Endo/Other  neg diabetesMorbid obesity  Renal/GU negative Renal ROS  negative genitourinary   Musculoskeletal   Abdominal   Peds  Hematology negative hematology ROS (+)   Anesthesia Other Findings Past Medical History:   Hypertension                                                 CHF (congestive heart failure) (HCC)                           Comment:? on office note2 /16 pcp   Pneumonia                                       12/15          Comment:hx   Depression                                                   Arthritis                                                    Reproductive/Obstetrics negative OB ROS                             Anesthesia Physical Anesthesia Plan  ASA: III  Anesthesia Plan: General   Post-op Pain Management:    Induction:    Airway Management Planned:   Additional Equipment:   Intra-op Plan:   Post-operative Plan:   Informed Consent: I have reviewed the patients History and Physical, chart, labs and discussed the procedure including the risks, benefits and alternatives for the proposed anesthesia with the patient or authorized representative who has indicated his/her understanding and acceptance.   Dental Advisory Given  Plan Discussed with: Anesthesiologist, CRNA and Surgeon  Anesthesia Plan Comments:         Anesthesia Quick Evaluation

## 2015-04-19 NOTE — H&P (Signed)
Primary Care Physician:  Pcp Not In System Primary Gastroenterologist:  Dr. Vira Agar  Pre-Procedure History & Physical: HPI:  Brittany Burke is a 69 y.o. female is here for an colonoscopy.   Past Medical History  Diagnosis Date  . Hypertension   . CHF (congestive heart failure) ([place])     ? on office note2 /16 pcp  . Pneumonia 12/15    hx  . Depression   . Arthritis     Past Surgical History  Procedure Laterality Date  . Abdominal hysterectomy  72  . Lumbar laminectomy/decompression microdiscectomy N/A 08/23/2014    Procedure: LUMBAR THREE-FOUR, LUMBAR FOUR-FIVE LUMBAR LAMINECTOMY/DECOMPRESSION MICRODISCECTOMY 2 LEVELS;  Surgeon: Newman Pies, MD;  Location: [facility];  Service: Neurosurgery;  Laterality: N/A;  L34 L45 Laminectomies  . Breast biopsy Right     negative 2011    Prior to Admission medications   Medication Sig Start Date End Date Taking? Authorizing Provider  amLODipine (NORVASC) 5 MG tablet Take 5 mg by mouth daily.   Yes Historical Provider, MD  diazepam (VALIUM) 5 MG tablet Take 1 tablet (5 mg total) by mouth every 6 (six) hours as needed for muscle spasms. 08/29/14  Yes Consuella Lose, MD  docusate sodium (COLACE) 100 MG capsule Take 1 capsule (100 mg total) by mouth 2 (two) times daily. 08/29/14  Yes Consuella Lose, MD  DULoxetine (CYMBALTA) 60 MG capsule Take 60 mg by mouth 2 (two) times daily.   Yes Historical Provider, MD  gabapentin (NEURONTIN) 100 MG capsule Take 100 mg by mouth 3 (three) times daily.   Yes Historical Provider, MD  hydrochlorothiazide (HYDRODIURIL) 25 MG tablet Take 25 mg by mouth daily.   Yes Historical Provider, MD  oxyCODONE-acetaminophen (PERCOCET/ROXICET) 5-325 MG per tablet Take 1-2 tablets by mouth every 4 (four) hours as needed for moderate pain. 08/29/14  Yes Consuella Lose, MD  potassium gluconate 595 MG TABS tablet Take 595 mg by mouth daily.   Yes Historical Provider, MD  traZODone (DESYREL) 100 MG tablet Take 200 mg  by mouth at bedtime.   Yes Historical Provider, MD    Allergies as of 03/20/2015  . (No Known Allergies)    Family History  Problem Relation Age of Onset  . Breast cancer Neg Hx     Social History   Social History  . Marital Status: Married    Spouse Name: N/A  . Number of Children: N/A  . Years of Education: N/A   Occupational History  . Not on file.   Social History Main Topics  . Smoking status: Current Every Day Smoker -- 1.00 packs/day for 38 years    Types: Cigarettes  . Smokeless tobacco: Not on file  . Alcohol Use: No  . Drug Use: No  . Sexual Activity: Not on file   Other Topics Concern  . Not on file   Social History Narrative    Review of Systems: See HPI, otherwise negative ROS  Physical Exam: BP 150/77 mmHg  Pulse 83  Temp(Src) 99.1 F (37.3 C) (Tympanic)  Resp 14  Ht 5\' 6"  (1.676 m)  Wt 99.791 kg (220 lb)  BMI 35.53 kg/m2  SpO2 97% General:   Alert,  pleasant and cooperative in NAD Head:  Normocephalic and atraumatic. Neck:  Supple; no masses or thyromegaly. Lungs:  Clear throughout to auscultation.    Heart:  Regular rate and rhythm. Abdomen:  Soft, nontender and nondistended. Normal bowel sounds, without guarding, and without rebound.   Neurologic:  Alert and  oriented x4;  grossly normal neurologically.  Impression/Plan: Brittany Burke is here for an colonoscopy to be performed for [facility] colon polyps  Risks, benefits, limitations, and alternatives regarding  colonoscopy have been reviewed with the patient.  Questions have been answered.  All parties agreeable.   Gaylyn Cheers, MD  04/19/2015, 12:11 PM

## 2015-04-20 ENCOUNTER — Encounter: Payer: Self-pay | Admitting: Unknown Physician Specialty

## 2015-04-20 LAB — SURGICAL PATHOLOGY

## 2015-04-20 NOTE — Anesthesia Postprocedure Evaluation (Signed)
  Anesthesia Post-op Note  Patient: Brittany Burke  Procedure(s) Performed: Procedure(s): COLONOSCOPY WITH PROPOFOL (N/A)  Anesthesia type:General  Patient location: PACU  Post pain: Pain level controlled  Post assessment: Post-op Vital signs reviewed, Patient's Cardiovascular Status Stable, Respiratory Function Stable, Patent Airway and No signs of Nausea or vomiting  Post vital signs: Reviewed and stable  Last Vitals:  Filed Vitals:   04/19/15 1320  BP: 103/86  Pulse: 75  Temp:   Resp: 14    Level of consciousness: awake, alert  and patient cooperative  Complications: No apparent anesthesia complications

## 2015-06-28 ENCOUNTER — Other Ambulatory Visit: Payer: Self-pay | Admitting: Family Medicine

## 2015-06-28 DIAGNOSIS — M48061 Spinal stenosis, lumbar region without neurogenic claudication: Secondary | ICD-10-CM

## 2015-06-28 DIAGNOSIS — G8929 Other chronic pain: Secondary | ICD-10-CM

## 2015-06-28 DIAGNOSIS — M545 Low back pain, unspecified: Secondary | ICD-10-CM

## 2015-07-18 ENCOUNTER — Ambulatory Visit: Admission: RE | Admit: 2015-07-18 | Payer: PRIVATE HEALTH INSURANCE | Source: Ambulatory Visit

## 2015-08-20 ENCOUNTER — Ambulatory Visit
Admission: RE | Admit: 2015-08-20 | Discharge: 2015-08-20 | Disposition: A | Discharge status: Home / Self Care | Payer: Medicare (Managed Care) | Source: Ambulatory Visit | Attending: Family Medicine | Admitting: Family Medicine

## 2015-08-20 DIAGNOSIS — M545 Low back pain: Secondary | ICD-10-CM | POA: Insufficient documentation

## 2015-08-20 DIAGNOSIS — M4806 Spinal stenosis, lumbar region: Secondary | ICD-10-CM | POA: Insufficient documentation

## 2015-08-20 DIAGNOSIS — G8929 Other chronic pain: Secondary | ICD-10-CM | POA: Insufficient documentation

## 2015-08-20 DIAGNOSIS — M48061 Spinal stenosis, lumbar region without neurogenic claudication: Secondary | ICD-10-CM

## 2015-08-20 LAB — POCT I-STAT CREATININE: Creatinine, Ser: 0.6 mg/dL (ref 0.44–1.00)

## 2015-08-20 MED ORDER — GADOBENATE DIMEGLUMINE 529 MG/ML IV SOLN
20.0000 mL | Freq: Once | INTRAVENOUS | Status: AC | PRN
  Administered 2015-08-20: 20 mL via INTRAVENOUS

## 2015-09-13 DIAGNOSIS — G629 Polyneuropathy, unspecified: Secondary | ICD-10-CM | POA: Insufficient documentation

## 2015-09-13 DIAGNOSIS — G250 Essential tremor: Secondary | ICD-10-CM | POA: Insufficient documentation

## 2016-04-18 DIAGNOSIS — J309 Allergic rhinitis, unspecified: Secondary | ICD-10-CM | POA: Insufficient documentation

## 2016-04-29 ENCOUNTER — Other Ambulatory Visit: Payer: Self-pay | Admitting: Gerontology

## 2016-04-29 DIAGNOSIS — Z0000 Encounter for general adult medical examination without abnormal findings: Secondary | ICD-10-CM

## 2016-06-13 ENCOUNTER — Ambulatory Visit: Payer: Medicare (Managed Care)

## 2016-07-15 ENCOUNTER — Ambulatory Visit
Admission: RE | Admit: 2016-07-15 | Discharge: 2016-07-15 | Disposition: A | Discharge status: Home / Self Care | Payer: Medicare (Managed Care) | Source: Ambulatory Visit | Attending: Gerontology | Admitting: Gerontology

## 2016-07-15 DIAGNOSIS — Z0000 Encounter for general adult medical examination without abnormal findings: Secondary | ICD-10-CM

## 2016-07-15 DIAGNOSIS — Z1231 Encounter for screening mammogram for malignant neoplasm of breast: Secondary | ICD-10-CM | POA: Diagnosis not present

## 2016-07-21 ENCOUNTER — Other Ambulatory Visit: Payer: Self-pay | Admitting: Gerontology

## 2016-07-21 DIAGNOSIS — Z0000 Encounter for general adult medical examination without abnormal findings: Secondary | ICD-10-CM

## 2016-07-25 DIAGNOSIS — J449 Chronic obstructive pulmonary disease, unspecified: Secondary | ICD-10-CM | POA: Insufficient documentation

## 2016-08-05 ENCOUNTER — Ambulatory Visit
Admission: RE | Admit: 2016-08-05 | Discharge: 2016-08-05 | Disposition: A | Discharge status: Home / Self Care | Payer: Medicare (Managed Care) | Source: Ambulatory Visit | Attending: Gerontology | Admitting: Gerontology

## 2016-08-05 DIAGNOSIS — Z0001 Encounter for general adult medical examination with abnormal findings: Secondary | ICD-10-CM | POA: Insufficient documentation

## 2016-08-05 DIAGNOSIS — Z0000 Encounter for general adult medical examination without abnormal findings: Secondary | ICD-10-CM | POA: Diagnosis present

## 2016-08-05 DIAGNOSIS — N6001 Solitary cyst of right breast: Secondary | ICD-10-CM | POA: Diagnosis not present

## 2017-06-02 DIAGNOSIS — Z22322 Carrier or suspected carrier of Methicillin resistant Staphylococcus aureus: Secondary | ICD-10-CM

## 2017-06-02 HISTORY — DX: Carrier or suspected carrier of methicillin resistant Staphylococcus aureus: Z22.322

## 2017-09-03 ENCOUNTER — Other Ambulatory Visit: Payer: Self-pay | Admitting: Family Medicine

## 2017-09-03 DIAGNOSIS — Z0000 Encounter for general adult medical examination without abnormal findings: Secondary | ICD-10-CM

## 2017-09-16 ENCOUNTER — Encounter
Admission: RE | Admit: 2017-09-16 | Discharge: 2017-09-16 | Disposition: A | Discharge status: Home / Self Care | Payer: Medicare (Managed Care) | Source: Ambulatory Visit | Attending: Orthopedic Surgery | Admitting: Orthopedic Surgery

## 2017-09-16 ENCOUNTER — Other Ambulatory Visit: Payer: Self-pay

## 2017-09-16 DIAGNOSIS — Z01810 Encounter for preprocedural cardiovascular examination: Secondary | ICD-10-CM | POA: Insufficient documentation

## 2017-09-16 DIAGNOSIS — I739 Peripheral vascular disease, unspecified: Secondary | ICD-10-CM | POA: Diagnosis not present

## 2017-09-16 DIAGNOSIS — I10 Essential (primary) hypertension: Secondary | ICD-10-CM | POA: Diagnosis not present

## 2017-09-16 DIAGNOSIS — Z01812 Encounter for preprocedural laboratory examination: Secondary | ICD-10-CM | POA: Diagnosis present

## 2017-09-16 HISTORY — DX: Peripheral vascular disease, unspecified: I73.9

## 2017-09-16 HISTORY — DX: Chronic obstructive pulmonary disease, unspecified: J44.9

## 2017-09-16 HISTORY — DX: Hyperlipidemia, unspecified: E78.5

## 2017-09-16 HISTORY — DX: Other intervertebral disc degeneration, lumbar region: M51.36

## 2017-09-16 HISTORY — DX: Spinal stenosis, lumbar region with neurogenic claudication: M48.062

## 2017-09-16 HISTORY — DX: Vitamin D deficiency, unspecified: E55.9

## 2017-09-16 HISTORY — DX: Anxiety disorder, unspecified: F41.9

## 2017-09-16 HISTORY — DX: Polyneuropathy, unspecified: G62.9

## 2017-09-16 HISTORY — DX: Urge incontinence: N39.41

## 2017-09-16 HISTORY — DX: Insomnia, unspecified: G47.00

## 2017-09-16 HISTORY — DX: Prediabetes: R73.03

## 2017-09-16 HISTORY — DX: Other intervertebral disc degeneration, lumbar region without mention of lumbar back pain or lower extremity pain: M51.369

## 2017-09-16 HISTORY — DX: Essential tremor: G25.0

## 2017-09-16 HISTORY — DX: Unspecified abnormalities of gait and mobility: R26.9

## 2017-09-16 LAB — SURGICAL PCR SCREEN
MRSA, PCR: POSITIVE — AB
STAPHYLOCOCCUS AUREUS: POSITIVE — AB

## 2017-09-16 LAB — URINALYSIS, ROUTINE W REFLEX MICROSCOPIC
Bilirubin Urine: NEGATIVE
GLUCOSE, UA: NEGATIVE mg/dL
HGB URINE DIPSTICK: NEGATIVE
Ketones, ur: NEGATIVE mg/dL
Leukocytes, UA: NEGATIVE
Nitrite: NEGATIVE
Protein, ur: NEGATIVE mg/dL
SPECIFIC GRAVITY, URINE: 1.019 (ref 1.005–1.030)
pH: 5 (ref 5.0–8.0)

## 2017-09-16 LAB — CBC
HCT: 43.5 % (ref 35.0–47.0)
Hemoglobin: 14.4 g/dL (ref 12.0–16.0)
MCH: 28.8 pg (ref 26.0–34.0)
MCHC: 33.2 g/dL (ref 32.0–36.0)
MCV: 86.8 fL (ref 80.0–100.0)
PLATELETS: 229 10*3/uL (ref 150–440)
RBC: 5.01 MIL/uL (ref 3.80–5.20)
RDW: 15.2 % — AB (ref 11.5–14.5)
WBC: 5.9 10*3/uL (ref 3.6–11.0)

## 2017-09-16 LAB — COMPREHENSIVE METABOLIC PANEL
ALK PHOS: 135 U/L — AB (ref 38–126)
ALT: 29 U/L (ref 14–54)
ANION GAP: 9 (ref 5–15)
AST: 28 U/L (ref 15–41)
Albumin: 4.3 g/dL (ref 3.5–5.0)
BUN: 16 mg/dL (ref 6–20)
CALCIUM: 8.9 mg/dL (ref 8.9–10.3)
CO2: 27 mmol/L (ref 22–32)
Chloride: 102 mmol/L (ref 101–111)
Creatinine, Ser: 0.62 mg/dL (ref 0.44–1.00)
GFR calc non Af Amer: >60 mL/min (ref >60)
Glucose, Bld: 100 mg/dL — ABNORMAL HIGH (ref 65–99)
Potassium: 3.8 mmol/L (ref 3.5–5.1)
SODIUM: 138 mmol/L (ref 135–145)
TOTAL PROTEIN: 7.8 g/dL (ref 6.5–8.1)
Total Bilirubin: 0.4 mg/dL (ref 0.3–1.2)

## 2017-09-16 LAB — TYPE AND SCREEN
ABO/RH(D): O POS
Antibody Screen: NEGATIVE

## 2017-09-16 LAB — APTT: aPTT: 33 seconds (ref 24–36)

## 2017-09-16 LAB — C-REACTIVE PROTEIN

## 2017-09-16 LAB — SEDIMENTATION RATE: Sed Rate: 5 mm/hr (ref 0–30)

## 2017-09-16 LAB — PROTIME-INR
INR: 0.99
Prothrombin Time: 13 seconds (ref 11.4–15.2)

## 2017-09-16 NOTE — Patient Instructions (Signed)
Your procedure is scheduled on: Monday 09/28/17 Report to [place]. To find out your arrival time please call [phone] between 1PM - 3PM on Friday 09/25/17.  Remember: Instructions that are not followed completely may result in serious medical risk, up to and including death, or upon the discretion of your surgeon and anesthesiologist your surgery may need to be rescheduled.     _X__ 1. Do not eat food after midnight the night before your procedure.                 No gum chewing or hard candies. You may drink clear liquids up to 2 hours                 before you are scheduled to arrive for your surgery- DO not drink clear                 liquids within 2 hours of the start of your surgery.                 Clear Liquids include:  water, apple juice without pulp, clear carbohydrate                 drink such as Clearfast or Gatorade, Black Coffee or Tea (Do not add                 anything to coffee or tea).  __X__2.  On the morning of surgery brush your teeth with toothpaste and water, you                 may rinse your mouth with mouthwash if you wish.  Do not swallow any              toothpaste of mouthwash.     _X__ 3.  No Alcohol for 24 hours before or after surgery.   _X__ 4.  Do Not Smoke or use e-cigarettes For 24 Hours Prior to Your Surgery.                 Do not use any chewable tobacco products for at least 6 hours prior to                 surgery.  ____  5.  Bring all medications with you on the day of surgery if instructed.   __X__  6.  Notify your doctor if there is any change in your medical condition      (cold, fever, infections).     Do not wear jewelry, make-up, hairpins, clips or nail polish. Do not wear lotions, powders, or perfumes.  Do not shave 48 hours prior to surgery. Men may shave face and neck. Do not bring valuables to the hospital.    [facility] is not responsible for any belongings or  valuables.  Contacts, dentures/partials or body piercings may not be worn into surgery. Bring a case for your contacts, glasses or hearing aids, a denture cup will be supplied. Leave your suitcase in the car. After surgery it may be brought to your room. For patients admitted to the hospital, discharge time is determined by your treatment team.   Patients discharged the day of surgery will not be allowed to drive home.   Please read over the following fact sheets that you were given:   MRSA Information  __X__ Take these medicines the morning of surgery with A SIP OF WATER:  1. CYMBALTA  2. NORVASC  3. NORTRIPTYLINE  4. NEURONTIN  5. CETIRIZINE  6. PREDNISONE IF HAVING BREATHING PROBLEMS  ____ Fleet Enema (as directed)   __X__ Use CHG Soap/SAGE wipes as directed  __X__ Use inhalers on the day of surgery AND TAKE A NEBULIZER TREATMENT BEFORE ARRIVAL  ____ Stop metformin/Janumet/Farxiga 2 days prior to surgery    ____ Take 1/2 of usual insulin dose the night before surgery. No insulin the morning          of surgery.   ____ Stop Blood Thinners Coumadin/Plavix/Xarelto/Pleta/Pradaxa/Eliquis/Effient/Aspirin  on   Or contact your Surgeon, Cardiologist or Medical Doctor regarding  ability to stop your blood thinners  __X__ Stop Anti-inflammatories 7 days before surgery such as Advil, Ibuprofen, Motrin,  BC or Goodies Powder, Naprosyn, Naproxen, Aleve, Aspirin    __X__ Stop all herbal supplements, fish oil or vitamin E until after surgery.    ____ Bring C-Pap to the hospital.

## 2017-09-16 NOTE — Pre-Procedure Instructions (Signed)
Faxed positive PCR results to Dr Clydell Hakim office.

## 2017-09-17 LAB — URINE CULTURE: SPECIAL REQUESTS: NORMAL

## 2017-09-24 ENCOUNTER — Ambulatory Visit
Admission: RE | Admit: 2017-09-24 | Discharge: 2017-09-24 | Disposition: A | Discharge status: Home / Self Care | Payer: Medicare (Managed Care) | Source: Ambulatory Visit | Attending: Family Medicine | Admitting: Family Medicine

## 2017-09-24 DIAGNOSIS — Z1231 Encounter for screening mammogram for malignant neoplasm of breast: Secondary | ICD-10-CM | POA: Diagnosis not present

## 2017-09-24 DIAGNOSIS — Z0000 Encounter for general adult medical examination without abnormal findings: Secondary | ICD-10-CM

## 2017-09-27 MED ORDER — VANCOMYCIN HCL IN DEXTROSE 1-5 GM/200ML-% IV SOLN
1000.0000 mg | Freq: Once | INTRAVENOUS | Status: AC
  Administered 2017-09-28: 1000 mg via INTRAVENOUS

## 2017-09-27 MED ORDER — TRANEXAMIC ACID 1000 MG/10ML IV SOLN
1000.0000 mg | INTRAVENOUS | Status: AC
  Administered 2017-09-28: 1000 mg via INTRAVENOUS
  Filled 2017-09-27 (×2): qty 10

## 2017-09-27 MED ORDER — CEFAZOLIN SODIUM-DEXTROSE 2-4 GM/100ML-% IV SOLN
2.0000 g | INTRAVENOUS | Status: AC
  Administered 2017-09-28: 2 g via INTRAVENOUS

## 2017-09-28 ENCOUNTER — Encounter: Payer: Self-pay | Admitting: Orthopedic Surgery

## 2017-09-28 ENCOUNTER — Encounter
Admission: RE | Disposition: A | Discharge status: Skilled Nursing Facility | Payer: Self-pay | Source: Ambulatory Visit | Attending: Orthopedic Surgery

## 2017-09-28 ENCOUNTER — Inpatient Hospital Stay
Admission: RE | Admit: 2017-09-28 | Discharge: 2017-09-30 | DRG: 470 | Disposition: A | Discharge status: Skilled Nursing Facility | Payer: Medicare (Managed Care) | Source: Ambulatory Visit | Attending: Orthopedic Surgery | Admitting: Orthopedic Surgery

## 2017-09-28 ENCOUNTER — Inpatient Hospital Stay: Payer: Medicare (Managed Care)

## 2017-09-28 ENCOUNTER — Other Ambulatory Visit: Payer: Self-pay

## 2017-09-28 ENCOUNTER — Inpatient Hospital Stay: Payer: Medicare (Managed Care) | Admitting: Anesthesiology

## 2017-09-28 DIAGNOSIS — Z6838 Body mass index (BMI) 38.0-38.9, adult: Secondary | ICD-10-CM | POA: Diagnosis not present

## 2017-09-28 DIAGNOSIS — M48062 Spinal stenosis, lumbar region with neurogenic claudication: Secondary | ICD-10-CM | POA: Diagnosis present

## 2017-09-28 DIAGNOSIS — E785 Hyperlipidemia, unspecified: Secondary | ICD-10-CM | POA: Diagnosis present

## 2017-09-28 DIAGNOSIS — F419 Anxiety disorder, unspecified: Secondary | ICD-10-CM | POA: Diagnosis present

## 2017-09-28 DIAGNOSIS — Z79899 Other long term (current) drug therapy: Secondary | ICD-10-CM | POA: Diagnosis not present

## 2017-09-28 DIAGNOSIS — G629 Polyneuropathy, unspecified: Secondary | ICD-10-CM | POA: Diagnosis present

## 2017-09-28 DIAGNOSIS — I509 Heart failure, unspecified: Secondary | ICD-10-CM | POA: Diagnosis present

## 2017-09-28 DIAGNOSIS — I739 Peripheral vascular disease, unspecified: Secondary | ICD-10-CM | POA: Diagnosis present

## 2017-09-28 DIAGNOSIS — Z96641 Presence of right artificial hip joint: Secondary | ICD-10-CM

## 2017-09-28 DIAGNOSIS — R7303 Prediabetes: Secondary | ICD-10-CM | POA: Diagnosis present

## 2017-09-28 DIAGNOSIS — F32A Depression, unspecified: Secondary | ICD-10-CM | POA: Insufficient documentation

## 2017-09-28 DIAGNOSIS — I110 Hypertensive heart disease with heart failure: Secondary | ICD-10-CM | POA: Diagnosis present

## 2017-09-28 DIAGNOSIS — M5106 Intervertebral disc disorders with myelopathy, lumbar region: Secondary | ICD-10-CM | POA: Diagnosis present

## 2017-09-28 DIAGNOSIS — G4700 Insomnia, unspecified: Secondary | ICD-10-CM | POA: Diagnosis present

## 2017-09-28 DIAGNOSIS — Z96649 Presence of unspecified artificial hip joint: Secondary | ICD-10-CM

## 2017-09-28 DIAGNOSIS — F329 Major depressive disorder, single episode, unspecified: Secondary | ICD-10-CM | POA: Diagnosis present

## 2017-09-28 DIAGNOSIS — J449 Chronic obstructive pulmonary disease, unspecified: Secondary | ICD-10-CM | POA: Diagnosis present

## 2017-09-28 DIAGNOSIS — M1611 Unilateral primary osteoarthritis, right hip: Secondary | ICD-10-CM | POA: Diagnosis present

## 2017-09-28 DIAGNOSIS — Z791 Long term (current) use of non-steroidal anti-inflammatories (NSAID): Secondary | ICD-10-CM | POA: Diagnosis not present

## 2017-09-28 DIAGNOSIS — Z7952 Long term (current) use of systemic steroids: Secondary | ICD-10-CM

## 2017-09-28 DIAGNOSIS — E569 Vitamin deficiency, unspecified: Secondary | ICD-10-CM | POA: Insufficient documentation

## 2017-09-28 DIAGNOSIS — G250 Essential tremor: Secondary | ICD-10-CM | POA: Diagnosis present

## 2017-09-28 DIAGNOSIS — F17210 Nicotine dependence, cigarettes, uncomplicated: Secondary | ICD-10-CM | POA: Diagnosis present

## 2017-09-28 HISTORY — PX: TOTAL HIP ARTHROPLASTY: SHX124

## 2017-09-28 LAB — ABO/RH: ABO/RH(D): O POS

## 2017-09-28 SURGERY — ARTHROPLASTY, HIP, TOTAL,POSTERIOR APPROACH
Anesthesia: General | Laterality: Right

## 2017-09-28 MED ORDER — SODIUM CHLORIDE 0.9 % IV SOLN
INTRAVENOUS | Status: DC
  Administered 2017-09-28: 16:00:00 via INTRAVENOUS
  Administered 2017-09-29: 100 mL/h via INTRAVENOUS

## 2017-09-28 MED ORDER — BISACODYL 10 MG RE SUPP
10.0000 mg | Freq: Every day | RECTAL | Status: DC | PRN
  Administered 2017-09-30: 10 mg via RECTAL
  Filled 2017-09-28: qty 1

## 2017-09-28 MED ORDER — PRAVASTATIN SODIUM 20 MG PO TABS
40.0000 mg | ORAL_TABLET | Freq: Every evening | ORAL | Status: DC
  Administered 2017-09-28 – 2017-09-29 (×2): 40 mg via ORAL
  Filled 2017-09-28 (×2): qty 2

## 2017-09-28 MED ORDER — NEOMYCIN-POLYMYXIN B GU 40-200000 IR SOLN
Status: AC
  Filled 2017-09-28: qty 20

## 2017-09-28 MED ORDER — ALBUTEROL SULFATE (2.5 MG/3ML) 0.083% IN NEBU: 2.5000 mg | INHALATION_SOLUTION | RESPIRATORY_TRACT | Status: DC | PRN

## 2017-09-28 MED ORDER — ACETAMINOPHEN 10 MG/ML IV SOLN
INTRAVENOUS | Status: AC
  Filled 2017-09-28: qty 100

## 2017-09-28 MED ORDER — METOCLOPRAMIDE HCL 10 MG PO TABS
10.0000 mg | ORAL_TABLET | Freq: Three times a day (TID) | ORAL | Status: AC
  Administered 2017-09-28 – 2017-09-30 (×8): 10 mg via ORAL
  Filled 2017-09-28 (×8): qty 1

## 2017-09-28 MED ORDER — GABAPENTIN 300 MG PO CAPS
ORAL_CAPSULE | ORAL | Status: AC
  Administered 2017-09-28: 300 mg via ORAL
  Filled 2017-09-28: qty 1

## 2017-09-28 MED ORDER — GABAPENTIN 300 MG PO CAPS
600.0000 mg | ORAL_CAPSULE | Freq: Three times a day (TID) | ORAL | Status: DC
  Administered 2017-09-28 – 2017-09-30 (×6): 600 mg via ORAL
  Filled 2017-09-28 (×6): qty 2

## 2017-09-28 MED ORDER — PHENOL 1.4 % MT LIQD
1.0000 | OROMUCOSAL | Status: DC | PRN
  Filled 2017-09-28: qty 177

## 2017-09-28 MED ORDER — FERROUS SULFATE 325 (65 FE) MG PO TABS
325.0000 mg | ORAL_TABLET | Freq: Two times a day (BID) | ORAL | Status: DC
  Administered 2017-09-28 – 2017-09-30 (×4): 325 mg via ORAL
  Filled 2017-09-28 (×4): qty 1

## 2017-09-28 MED ORDER — ACETAMINOPHEN 325 MG PO TABS: 325.0000 mg | ORAL_TABLET | Freq: Four times a day (QID) | ORAL | Status: DC | PRN

## 2017-09-28 MED ORDER — CEFAZOLIN SODIUM-DEXTROSE 2-4 GM/100ML-% IV SOLN
2.0000 g | Freq: Four times a day (QID) | INTRAVENOUS | Status: AC
Start: 2017-09-28 — End: 2017-09-29
  Administered 2017-09-28 – 2017-09-29 (×3): 2 g via INTRAVENOUS
  Filled 2017-09-28 (×5): qty 100

## 2017-09-28 MED ORDER — PROPOFOL 10 MG/ML IV BOLUS
INTRAVENOUS | Status: AC
  Filled 2017-09-28: qty 20

## 2017-09-28 MED ORDER — FENTANYL CITRATE (PF) 250 MCG/5ML IJ SOLN
INTRAMUSCULAR | Status: AC
  Filled 2017-09-28: qty 5

## 2017-09-28 MED ORDER — ALBUTEROL SULFATE HFA 108 (90 BASE) MCG/ACT IN AERS: 1.0000 | INHALATION_SPRAY | RESPIRATORY_TRACT | Status: DC | PRN

## 2017-09-28 MED ORDER — TORSEMIDE 5 MG PO TABS
5.0000 mg | ORAL_TABLET | Freq: Every day | ORAL | Status: DC
  Administered 2017-09-28 – 2017-09-29 (×2): 5 mg via ORAL
  Filled 2017-09-28 (×3): qty 1

## 2017-09-28 MED ORDER — ALBUTEROL SULFATE HFA 108 (90 BASE) MCG/ACT IN AERS
INHALATION_SPRAY | RESPIRATORY_TRACT | Status: AC
  Filled 2017-09-28: qty 6.7

## 2017-09-28 MED ORDER — LIDOCAINE HCL (PF) 2 % IJ SOLN
INTRAMUSCULAR | Status: AC
  Filled 2017-09-28: qty 10

## 2017-09-28 MED ORDER — MIDAZOLAM HCL 2 MG/2ML IJ SOLN
INTRAMUSCULAR | Status: DC | PRN
  Administered 2017-09-28: 2 mg via INTRAVENOUS

## 2017-09-28 MED ORDER — OLOPATADINE HCL 0.1 % OP SOLN
1.0000 [drp] | Freq: Two times a day (BID) | OPHTHALMIC | Status: DC
  Administered 2017-09-28 – 2017-09-30 (×4): 1 [drp] via OPHTHALMIC
  Filled 2017-09-28: qty 5

## 2017-09-28 MED ORDER — LACTATED RINGERS IV SOLN
INTRAVENOUS | Status: DC
  Administered 2017-09-28 (×2): via INTRAVENOUS

## 2017-09-28 MED ORDER — FLEET ENEMA 7-19 GM/118ML RE ENEM
1.0000 | ENEMA | Freq: Once | RECTAL | Status: DC | PRN
Start: 2017-09-28 — End: 2017-09-30

## 2017-09-28 MED ORDER — TRAMADOL HCL 50 MG PO TABS
50.0000 mg | ORAL_TABLET | ORAL | Status: DC | PRN
  Administered 2017-09-29: 50 mg via ORAL
  Administered 2017-09-30: 100 mg via ORAL
  Filled 2017-09-28: qty 1
  Filled 2017-09-28: qty 2

## 2017-09-28 MED ORDER — ACETAMINOPHEN 10 MG/ML IV SOLN
1000.0000 mg | Freq: Four times a day (QID) | INTRAVENOUS | Status: AC
  Administered 2017-09-29 (×3): 1000 mg via INTRAVENOUS
  Filled 2017-09-28 (×4): qty 100

## 2017-09-28 MED ORDER — VANCOMYCIN HCL IN DEXTROSE 1-5 GM/200ML-% IV SOLN
INTRAVENOUS | Status: AC
  Administered 2017-09-28: 1000 mg via INTRAVENOUS
  Filled 2017-09-28: qty 200

## 2017-09-28 MED ORDER — ENOXAPARIN SODIUM 30 MG/0.3ML ~~LOC~~ SOLN
30.0000 mg | Freq: Two times a day (BID) | SUBCUTANEOUS | Status: DC
  Administered 2017-09-29 – 2017-09-30 (×3): 30 mg via SUBCUTANEOUS
  Filled 2017-09-28 (×3): qty 0.3

## 2017-09-28 MED ORDER — ALBUTEROL SULFATE HFA 108 (90 BASE) MCG/ACT IN AERS
INHALATION_SPRAY | RESPIRATORY_TRACT | Status: DC | PRN
  Administered 2017-09-28: 4 via RESPIRATORY_TRACT

## 2017-09-28 MED ORDER — ONDANSETRON HCL 4 MG/2ML IJ SOLN
INTRAMUSCULAR | Status: DC | PRN
  Administered 2017-09-28: 4 mg via INTRAVENOUS

## 2017-09-28 MED ORDER — NEOMYCIN-POLYMYXIN B GU 40-200000 IR SOLN
Status: DC | PRN
Start: 2017-09-28 — End: 2017-09-28
  Administered 2017-09-28: 12 mL

## 2017-09-28 MED ORDER — SENNOSIDES-DOCUSATE SODIUM 8.6-50 MG PO TABS
1.0000 | ORAL_TABLET | Freq: Two times a day (BID) | ORAL | Status: DC
  Administered 2017-09-28 – 2017-09-30 (×4): 1 via ORAL
  Filled 2017-09-28 (×3): qty 1

## 2017-09-28 MED ORDER — ONDANSETRON HCL 4 MG/2ML IJ SOLN
INTRAMUSCULAR | Status: AC
  Filled 2017-09-28: qty 2

## 2017-09-28 MED ORDER — DULOXETINE HCL 60 MG PO CPEP
120.0000 mg | ORAL_CAPSULE | Freq: Every day | ORAL | Status: DC
  Administered 2017-09-28 – 2017-09-30 (×3): 120 mg via ORAL
  Filled 2017-09-28 (×3): qty 2

## 2017-09-28 MED ORDER — ROCURONIUM BROMIDE 50 MG/5ML IV SOLN
INTRAVENOUS | Status: AC
  Filled 2017-09-28: qty 2

## 2017-09-28 MED ORDER — HYDROMORPHONE HCL 1 MG/ML IJ SOLN: 0.5000 mg | INTRAMUSCULAR | Status: DC | PRN

## 2017-09-28 MED ORDER — SUGAMMADEX SODIUM 200 MG/2ML IV SOLN
INTRAVENOUS | Status: AC
  Filled 2017-09-28: qty 2

## 2017-09-28 MED ORDER — METOCLOPRAMIDE HCL 5 MG/ML IJ SOLN: 5.0000 mg | Freq: Three times a day (TID) | INTRAMUSCULAR | Status: DC | PRN

## 2017-09-28 MED ORDER — PHENYLEPHRINE HCL 10 MG/ML IJ SOLN
INTRAMUSCULAR | Status: DC | PRN
  Administered 2017-09-28 (×2): 100 ug via INTRAVENOUS

## 2017-09-28 MED ORDER — PHENYLEPHRINE HCL 10 MG/ML IJ SOLN
INTRAMUSCULAR | Status: DC | PRN
  Administered 2017-09-28: 30 ug/min via INTRAVENOUS

## 2017-09-28 MED ORDER — MIDAZOLAM HCL 2 MG/2ML IJ SOLN
INTRAMUSCULAR | Status: AC
  Filled 2017-09-28: qty 2

## 2017-09-28 MED ORDER — CELECOXIB 200 MG PO CAPS
400.0000 mg | ORAL_CAPSULE | Freq: Once | ORAL | Status: AC
  Administered 2017-09-28: 400 mg via ORAL

## 2017-09-28 MED ORDER — POLYVINYL ALCOHOL 1.4 % OP SOLN
1.0000 [drp] | Freq: Two times a day (BID) | OPHTHALMIC | Status: DC
  Administered 2017-09-28 – 2017-09-30 (×4): 1 [drp] via OPHTHALMIC
  Filled 2017-09-28: qty 15

## 2017-09-28 MED ORDER — CELECOXIB 200 MG PO CAPS
ORAL_CAPSULE | ORAL | Status: AC
  Administered 2017-09-28: 400 mg via ORAL
  Filled 2017-09-28: qty 2

## 2017-09-28 MED ORDER — CHLORHEXIDINE GLUCONATE 4 % EX LIQD: 60.0000 mL | Freq: Once | CUTANEOUS | Status: DC

## 2017-09-28 MED ORDER — DEXAMETHASONE SODIUM PHOSPHATE 10 MG/ML IJ SOLN
8.0000 mg | Freq: Once | INTRAMUSCULAR | Status: AC
  Administered 2017-09-28: 8 mg via INTRAVENOUS

## 2017-09-28 MED ORDER — CELECOXIB 200 MG PO CAPS
200.0000 mg | ORAL_CAPSULE | Freq: Two times a day (BID) | ORAL | Status: DC
Start: 2017-09-28 — End: 2017-09-30
  Administered 2017-09-28 – 2017-09-30 (×4): 200 mg via ORAL
  Filled 2017-09-28 (×4): qty 1

## 2017-09-28 MED ORDER — TRANEXAMIC ACID 1000 MG/10ML IV SOLN
1000.0000 mg | Freq: Once | INTRAVENOUS | Status: AC
  Administered 2017-09-28: 1000 mg via INTRAVENOUS
  Filled 2017-09-28: qty 10

## 2017-09-28 MED ORDER — FAMOTIDINE 20 MG PO TABS
20.0000 mg | ORAL_TABLET | Freq: Once | ORAL | Status: AC
  Administered 2017-09-28: 20 mg via ORAL

## 2017-09-28 MED ORDER — AMLODIPINE BESYLATE 5 MG PO TABS
2.5000 mg | ORAL_TABLET | Freq: Every day | ORAL | Status: DC
  Administered 2017-09-29: 2.5 mg via ORAL
  Filled 2017-09-28: qty 1

## 2017-09-28 MED ORDER — ALUM & MAG HYDROXIDE-SIMETH 200-200-20 MG/5ML PO SUSP: 30.0000 mL | ORAL | Status: DC | PRN

## 2017-09-28 MED ORDER — MENTHOL 3 MG MT LOZG
1.0000 | LOZENGE | OROMUCOSAL | Status: DC | PRN
  Filled 2017-09-28: qty 9

## 2017-09-28 MED ORDER — DEXAMETHASONE SODIUM PHOSPHATE 10 MG/ML IJ SOLN
INTRAMUSCULAR | Status: AC
  Administered 2017-09-28: 8 mg via INTRAVENOUS
  Filled 2017-09-28: qty 1

## 2017-09-28 MED ORDER — FAMOTIDINE 20 MG PO TABS
ORAL_TABLET | ORAL | Status: AC
  Administered 2017-09-28: 20 mg via ORAL
  Filled 2017-09-28: qty 1

## 2017-09-28 MED ORDER — ACETAMINOPHEN 10 MG/ML IV SOLN
INTRAVENOUS | Status: DC | PRN
  Administered 2017-09-28: 1000 mg via INTRAVENOUS

## 2017-09-28 MED ORDER — HYDROCORTISONE 1 % EX CREA
1.0000 "application " | TOPICAL_CREAM | Freq: Four times a day (QID) | CUTANEOUS | Status: DC | PRN
  Filled 2017-09-28: qty 28

## 2017-09-28 MED ORDER — PROPOFOL 10 MG/ML IV BOLUS
INTRAVENOUS | Status: DC | PRN
  Administered 2017-09-28: 180 mg via INTRAVENOUS

## 2017-09-28 MED ORDER — SUGAMMADEX SODIUM 500 MG/5ML IV SOLN
INTRAVENOUS | Status: DC | PRN
  Administered 2017-09-28: 200 mg via INTRAVENOUS

## 2017-09-28 MED ORDER — MAGNESIUM HYDROXIDE 400 MG/5ML PO SUSP
30.0000 mL | Freq: Every day | ORAL | Status: DC | PRN
  Administered 2017-09-29: 30 mL via ORAL
  Filled 2017-09-28: qty 30

## 2017-09-28 MED ORDER — PANTOPRAZOLE SODIUM 40 MG PO TBEC
40.0000 mg | DELAYED_RELEASE_TABLET | Freq: Two times a day (BID) | ORAL | Status: DC
  Administered 2017-09-28 – 2017-09-30 (×4): 40 mg via ORAL
  Filled 2017-09-28 (×4): qty 1

## 2017-09-28 MED ORDER — DIPHENHYDRAMINE HCL 12.5 MG/5ML PO ELIX: 12.5000 mg | ORAL_SOLUTION | ORAL | Status: DC | PRN

## 2017-09-28 MED ORDER — CEFAZOLIN SODIUM-DEXTROSE 2-4 GM/100ML-% IV SOLN
INTRAVENOUS | Status: AC
  Filled 2017-09-28: qty 100

## 2017-09-28 MED ORDER — ROCURONIUM BROMIDE 100 MG/10ML IV SOLN
INTRAVENOUS | Status: DC | PRN
  Administered 2017-09-28: 20 mg via INTRAVENOUS
  Administered 2017-09-28: 80 mg via INTRAVENOUS

## 2017-09-28 MED ORDER — LIDOCAINE HCL (CARDIAC) PF 100 MG/5ML IV SOSY
PREFILLED_SYRINGE | INTRAVENOUS | Status: DC | PRN
  Administered 2017-09-28: 100 mg via INTRAVENOUS

## 2017-09-28 MED ORDER — GABAPENTIN 300 MG PO CAPS
300.0000 mg | ORAL_CAPSULE | Freq: Once | ORAL | Status: AC
  Administered 2017-09-28: 300 mg via ORAL

## 2017-09-28 MED ORDER — TRAZODONE HCL 100 MG PO TABS
200.0000 mg | ORAL_TABLET | Freq: Every day | ORAL | Status: DC
  Administered 2017-09-28 – 2017-09-29 (×2): 200 mg via ORAL
  Filled 2017-09-28 (×2): qty 2

## 2017-09-28 MED ORDER — METOCLOPRAMIDE HCL 10 MG PO TABS: 5.0000 mg | ORAL_TABLET | Freq: Three times a day (TID) | ORAL | Status: DC | PRN

## 2017-09-28 MED ORDER — ONDANSETRON HCL 4 MG PO TABS: 4.0000 mg | ORAL_TABLET | Freq: Four times a day (QID) | ORAL | Status: DC | PRN

## 2017-09-28 MED ORDER — NORTRIPTYLINE HCL 25 MG PO CAPS
25.0000 mg | ORAL_CAPSULE | Freq: Two times a day (BID) | ORAL | Status: DC
  Administered 2017-09-28 – 2017-09-30 (×4): 25 mg via ORAL
  Filled 2017-09-28 (×5): qty 1

## 2017-09-28 MED ORDER — AZELASTINE HCL 0.1 % NA SOLN
2.0000 | Freq: Two times a day (BID) | NASAL | Status: DC
  Administered 2017-09-28 – 2017-09-30 (×4): 2 via NASAL
  Filled 2017-09-28: qty 30

## 2017-09-28 MED ORDER — OXYCODONE HCL 5 MG PO TABS
10.0000 mg | ORAL_TABLET | ORAL | Status: DC | PRN
  Administered 2017-09-29 (×2): 10 mg via ORAL
  Filled 2017-09-28 (×2): qty 2

## 2017-09-28 MED ORDER — LIDOCAINE 5 % EX PTCH
1.0000 | MEDICATED_PATCH | CUTANEOUS | Status: DC
  Administered 2017-09-28 – 2017-09-29 (×2): 1 via TRANSDERMAL
  Filled 2017-09-28 (×3): qty 1

## 2017-09-28 MED ORDER — OXYCODONE HCL 5 MG PO TABS
5.0000 mg | ORAL_TABLET | ORAL | Status: DC | PRN
  Administered 2017-09-28 – 2017-09-30 (×3): 5 mg via ORAL
  Filled 2017-09-28 (×3): qty 1

## 2017-09-28 MED ORDER — ONDANSETRON HCL 4 MG/2ML IJ SOLN: 4.0000 mg | Freq: Four times a day (QID) | INTRAMUSCULAR | Status: DC | PRN

## 2017-09-28 MED ORDER — LORATADINE 10 MG PO TABS
10.0000 mg | ORAL_TABLET | Freq: Every day | ORAL | Status: DC
  Administered 2017-09-28 – 2017-09-30 (×3): 10 mg via ORAL
  Filled 2017-09-28 (×2): qty 1

## 2017-09-28 MED ORDER — FENTANYL CITRATE (PF) 100 MCG/2ML IJ SOLN
INTRAMUSCULAR | Status: DC | PRN
  Administered 2017-09-28: 75 ug via INTRAVENOUS
  Administered 2017-09-28: 100 ug via INTRAVENOUS
  Administered 2017-09-28: 250 ug via INTRAVENOUS
  Administered 2017-09-28: 75 ug via INTRAVENOUS

## 2017-09-28 MED ORDER — LABETALOL HCL 5 MG/ML IV SOLN
INTRAVENOUS | Status: DC | PRN
  Administered 2017-09-28: 20 mg via INTRAVENOUS

## 2017-09-28 SURGICAL SUPPLY — 55 items
BLADE DRUM FLTD (BLADE) ×3 IMPLANT
BLADE SAW 1 (BLADE) ×3 IMPLANT
CANISTER SUCT 1200ML W/VALVE (MISCELLANEOUS) ×3 IMPLANT
CANISTER SUCT 3000ML PPV (MISCELLANEOUS) ×6 IMPLANT
CAPT HIP TOTAL 2 ×2 IMPLANT
CARTRIDGE OIL MAESTRO DRILL (MISCELLANEOUS) ×1 IMPLANT
DIFFUSER DRILL AIR PNEUMATIC (MISCELLANEOUS) ×3 IMPLANT
DRAPE INCISE IOBAN 66X60 STRL (DRAPES) ×3 IMPLANT
DRAPE SHEET LG 3/4 BI-LAMINATE (DRAPES) ×3 IMPLANT
DRSG DERMACEA 8X12 NADH (GAUZE/BANDAGES/DRESSINGS) ×3 IMPLANT
DRSG OPSITE POSTOP 4X12 (GAUZE/BANDAGES/DRESSINGS) ×3 IMPLANT
DRSG OPSITE POSTOP 4X14 (GAUZE/BANDAGES/DRESSINGS) IMPLANT
DRSG TEGADERM 4X4.75 (GAUZE/BANDAGES/DRESSINGS) ×3 IMPLANT
DURAPREP 26ML APPLICATOR (WOUND CARE) ×3 IMPLANT
ELECT BLADE 6.5 EXT (BLADE) ×3 IMPLANT
ELECT CAUTERY BLADE 6.4 (BLADE) ×3 IMPLANT
GLOVE BIOGEL M STRL SZ7.5 (GLOVE) ×6 IMPLANT
GLOVE BIOGEL PI IND STRL 9 (GLOVE) ×1 IMPLANT
GLOVE BIOGEL PI INDICATOR 9 (GLOVE) ×2
GLOVE INDICATOR 8.0 STRL GRN (GLOVE) ×3 IMPLANT
GLOVE SURG SYN 9.0  PF PI (GLOVE) ×2
GLOVE SURG SYN 9.0 PF PI (GLOVE) ×1 IMPLANT
GOWN STRL REUS W/ TWL LRG LVL3 (GOWN DISPOSABLE) ×2 IMPLANT
GOWN STRL REUS W/TWL 2XL LVL3 (GOWN DISPOSABLE) ×3 IMPLANT
GOWN STRL REUS W/TWL LRG LVL3 (GOWN DISPOSABLE) ×6
HANDLE YANKAUER SUCT BULB TIP (MISCELLANEOUS) ×2 IMPLANT
HEMOVAC 400CC 10FR (MISCELLANEOUS) ×3 IMPLANT
HOLDER FOLEY CATH W/STRAP (MISCELLANEOUS) ×3 IMPLANT
HOOD PEEL AWAY FLYTE STAYCOOL (MISCELLANEOUS) ×6 IMPLANT
KIT TURNOVER KIT A (KITS) ×3 IMPLANT
NDL SAFETY ECLIPSE 18X1.5 (NEEDLE) ×1 IMPLANT
NEEDLE HYPO 18GX1.5 SHARP (NEEDLE) ×3
NS IRRIG 500ML POUR BTL (IV SOLUTION) ×3 IMPLANT
OIL CARTRIDGE MAESTRO DRILL (MISCELLANEOUS) ×3
PACK HIP PROSTHESIS (MISCELLANEOUS) ×3 IMPLANT
PIN STEIN THRED 5/32 (Pin) ×3 IMPLANT
PULSAVAC PLUS IRRIG FAN TIP (DISPOSABLE) ×3
SOL .9 NS 3000ML IRR  AL (IV SOLUTION) ×2
SOL .9 NS 3000ML IRR AL (IV SOLUTION) ×1
SOL .9 NS 3000ML IRR UROMATIC (IV SOLUTION) ×1 IMPLANT
SOL PREP PVP 2OZ (MISCELLANEOUS) ×3
SOLUTION PREP PVP 2OZ (MISCELLANEOUS) ×1 IMPLANT
SPONGE DRAIN TRACH 4X4 STRL 2S (GAUZE/BANDAGES/DRESSINGS) ×3 IMPLANT
STAPLER SKIN PROX 35W (STAPLE) ×3 IMPLANT
SUT ETHIBOND #5 BRAIDED 30INL (SUTURE) ×3 IMPLANT
SUT VIC AB 0 CT1 36 (SUTURE) ×3 IMPLANT
SUT VIC AB 1 CT1 36 (SUTURE) ×6 IMPLANT
SUT VIC AB 2-0 CT1 27 (SUTURE) ×3
SUT VIC AB 2-0 CT1 TAPERPNT 27 (SUTURE) ×1 IMPLANT
SYR 20CC LL (SYRINGE) ×3 IMPLANT
TAPE ADH 3 LX (MISCELLANEOUS) ×3 IMPLANT
TAPE TRANSPORE STRL 2 31045 (GAUZE/BANDAGES/DRESSINGS) ×3 IMPLANT
TIP FAN IRRIG PULSAVAC PLUS (DISPOSABLE) ×1 IMPLANT
TOWEL OR 17X26 4PK STRL BLUE (TOWEL DISPOSABLE) ×3 IMPLANT
TRAY FOLEY W/METER SILVER 16FR (SET/KITS/TRAYS/PACK) ×3 IMPLANT

## 2017-09-28 NOTE — Clinical Social Work Placement (Signed)
   CLINICAL SOCIAL WORK PLACEMENT  NOTE  Date:  09/28/2017  Patient Details  Name: Brittany Burke MRN: [id number] Date of Birth: 1945/06/28  Clinical Social Work is seeking post-discharge placement for this patient at the [place] level of care (*CSW will initial, date and re-position this form in  chart as items are completed):  Yes   Patient/family provided with [place] Work Department's list of facilities offering this level of care within the geographic area requested by the patient (or if unable, by the patient's family).  Yes   Patient/family informed of their freedom to choose among providers that offer the needed level of care, that participate in Medicare, Medicaid or managed care program needed by the patient, have an available bed and are willing to accept the patient.  Yes   Patient/family informed of [facility]'s ownership interest in [facility] and [facility], as well as of the fact that they are under no obligation to receive care at these facilities.  PASRR submitted to EDS on       PASRR number received on       Existing PASRR number confirmed on 09/28/17     FL2 transmitted to all facilities in geographic area requested by pt/family on 09/28/17     FL2 transmitted to all facilities within larger geographic area on       Patient informed that his/her managed care company has contracts with or will negotiate with certain facilities, including the following:            Patient/family informed of bed offers received.  Patient chooses bed at       Physician recommends and patient chooses bed at      Patient to be transferred to   on  .  Patient to be transferred to facility by       Patient family notified on   of transfer.  Name of family member notified:        PHYSICIAN       Additional Comment:    _______________________________________________ Lyman Balingit, Veronia Beets, LCSW 09/28/2017, 4:25 PM

## 2017-09-28 NOTE — Anesthesia Procedure Notes (Signed)
Procedure Name: Intubation Date/Time: 09/28/2017 10:44 AM Performed by: Bernardo Heater, CRNA Pre-anesthesia Checklist: Patient identified, Emergency Drugs available, Suction available and Patient being monitored Patient Re-evaluated:Patient Re-evaluated prior to induction Oxygen Delivery Method: Circle system utilized Preoxygenation: Pre-oxygenation with 100% oxygen Induction Type: IV induction Laryngoscope Size: Mac and 3 Grade View: Grade I Tube size: 7.0 mm Placement Confirmation: ETT inserted through vocal cords under direct vision,  positive ETCO2 and breath sounds checked- equal and bilateral Secured at: 21 cm Tube secured with: Tape Dental Injury: Teeth and Oropharynx as per pre-operative assessment

## 2017-09-28 NOTE — Anesthesia Postprocedure Evaluation (Signed)
Anesthesia Post Note  Patient: Brittany Burke  Procedure(s) Performed: TOTAL HIP ARTHROPLASTY (Right )  Patient location during evaluation: PACU Anesthesia Type: General Level of consciousness: awake and alert and oriented Pain management: pain level controlled Vital Signs Assessment: post-procedure vital signs reviewed and stable Respiratory status: spontaneous breathing Cardiovascular status: blood pressure returned to baseline Anesthetic complications: no     Last Vitals:  Vitals:   09/28/17 1500 09/28/17 1515  BP: 127/69 130/71  Pulse: 78 76  Resp: 12 14  Temp:  36.5 C  SpO2: 99% 94%    Last Pain:  Vitals:   09/28/17 1515  TempSrc:   PainSc: 0-No pain                 Davianna Deutschman

## 2017-09-28 NOTE — Transfer of Care (Signed)
Immediate Anesthesia Transfer of Care Note  Patient: Brittany Burke  Procedure(s) Performed: TOTAL HIP ARTHROPLASTY (Left )  Patient Location: PACU  Anesthesia Type:General  Level of Consciousness: sedated  Airway & Oxygen Therapy: Patient Spontanous Breathing and Patient connected to nasal cannula oxygen  Post-op Assessment: Report given to RN and Post -op Vital signs reviewed and stable  Post vital signs: Reviewed and stable  Last Vitals:  Vitals Value Taken Time  BP 116/67 09/28/2017  2:15 PM  Temp 36.1 C 09/28/2017  2:15 PM  Pulse 77 09/28/2017  2:16 PM  Resp 18 09/28/2017  2:16 PM  SpO2 95 % 09/28/2017  2:16 PM  Vitals shown include unvalidated device data.  Last Pain:  Vitals:   09/28/17 0905  TempSrc: Oral  PainSc: 0-No pain         Complications: No apparent anesthesia complications

## 2017-09-28 NOTE — Clinical Social Work Note (Addendum)
Clinical Social Work Assessment  Patient Details  Name: Brittany Burke MRN: [id number] Date of Birth: 1945/07/07  Date of referral:  09/28/17               Reason for consult:  Discharge Planning                Permission sought to share information with:  Facility Sport and exercise psychologist, Tourist information centre manager, Family Supports Permission granted to share information::  Yes, Verbal Permission Granted  Name::      SNF  Agency::   [facility]  Relationship::     Contact Information:     Housing/Transportation Living arrangements for the past 2 months:  [place] of Information:  Patient Patient Interpreter Needed:  None Criminal Activity/Legal Involvement Pertinent to Current Situation/Hospitalization:  No - Comment as needed Significant Relationships:  Adult Children, Significant Other Lives with:  Significant Other Do you feel safe going back to the place where you live?  Yes Need for family participation in patient care:  No (Coment)  Care giving concerns:  Patient is currently followed by [facility]. She lives with her significant other in [place].    Social Worker assessment / plan:  Holiday representative (CSW) received SNF consult. Patient had surgery today. Patient is being followed by [facility] and this was a planned surgery. CSW contacted [place] with [facility] and she stated that patient is aware of the skilled nursing facilities that are contracted with [facility]. Per Merlinda Frederick plan is for patient to D/C to [facility] or [facility] few days prior to coming home. CSW met with patient and her significant other. Patient reports that she is aware the two facilities are [facility] and [facility]. Patient stated that she is fine with either but her preference would be [facility]. Patient reports that she was already planning to go to SNF at discharge and is agreeable to SNF placement. Patient states that she has an adult daughter [name]) that is local and is involved in her care.  CSW explained the bed search process and that she would receive bed offers once they have been made.   Employment status:  Retired Forensic scientist:  Other (Comment Required)([facility]) PT Recommendations:  [place] / Referral to community resources:  [place]  Patient/Family's Response to care: Patient is accepting of current DC plan   Patient/Family's Understanding of and Emotional Response to Diagnosis, Current Treatment, and Prognosis:  Patient is pleasant and accepting of discharge to SNF facility.   Emotional Assessment Appearance:  Appears stated age Attitude/Demeanor/Rapport:    Affect (typically observed):  Accepting, Pleasant Orientation:  Oriented to Self, Oriented to Place, Oriented to  Time, Oriented to Situation Alcohol / Substance use:  Not Applicable Psych involvement (Current and /or in the community):  No (Comment)  Discharge Needs  Concerns to be addressed:  Discharge Planning Concerns Readmission within the last 30 days:  No Current discharge risk:  Dependent with Mobility Barriers to Discharge:  Continued Medical Work up   [employer], [place] 09/28/2017, 4:03 PM

## 2017-09-28 NOTE — H&P (Signed)
The patient has been re-examined, and the chart reviewed, and there have been no interval changes to the documented history and physical.    The risks, benefits, and alternatives have been discussed at length. The patient expressed understanding of the risks benefits and agreed with plans for surgical intervention.  James P. Hooten, Jr. M.D.    

## 2017-09-28 NOTE — NC FL2 (Addendum)
[place] LEVEL OF CARE SCREENING TOOL     IDENTIFICATION  Patient Name: Brittany Burke Birthdate: 02-11-1946 Sex: female Admission Date (Current Location): 09/28/2017  [facility] and [place] Number:  Selena Lesser ([id number]) Facility and Address:  [facility], [address] [place], [place] [address]      Provider Number: [id number]  Attending Physician Name and Address:  Dereck Leep, MD  Relative Name and Phone Number:       Current Level of Care: Hospital Recommended Level of Care: [place] Prior Approval Number:    Date Approved/Denied:   PASRR Number:  [id number]   Discharge Plan: SNF    Current Diagnoses: Patient Active Problem List   Diagnosis Date Noted  . Depression 09/28/2017  . Hyperlipemia 09/28/2017  . Prediabetes 09/28/2017  . Vitamin deficiency 09/28/2017  . Status post total replacement of hip 09/28/2017  . Anxiety disorder, unspecified 10/20/2014  . Abnormality of gait and mobility 10/20/2014  . Postherpetic neuralgia 10/20/2014  . Primary osteoarthritis of left shoulder 10/11/2014  . Lumbar stenosis with neurogenic claudication 08/23/2014  . Pedal edema 06/28/2014  . Depression, major, in remission ([place]) 01/06/2014  . PVD (peripheral vascular disease) ([place]) 01/06/2014  . Obesity, unspecified 01/06/2014  . Lumbar spondylosis 12/05/2013    Orientation RESPIRATION BLADDER Height & Weight     Self, Time, Situation, Place  O2(2 Liters Oxygen. ) Continent Weight: 240 lb (108.9 kg) Height:  5\' 6"  (167.6 cm)  BEHAVIORAL SYMPTOMS/MOOD NEUROLOGICAL BOWEL NUTRITION STATUS      Continent Diet(Diet: Clear Liquid to be Advanced. )  AMBULATORY STATUS COMMUNICATION OF NEEDS Skin   Extensive Assist Verbally Surgical wounds(Incision: Right Hip. )                       Personal Care Assistance Level of Assistance  Bathing, Feeding, Dressing Bathing Assistance: Limited assistance Feeding  assistance: Independent Dressing Assistance: Limited assistance     Functional Limitations Info  Sight, Hearing, Speech Sight Info: Adequate Hearing Info: Adequate Speech Info: Adequate    SPECIAL CARE FACTORS FREQUENCY  PT (By licensed PT), OT (By licensed OT)     PT Frequency: (5) OT Frequency: (5)            Contractures      Additional Factors Info  Code Status, Allergies, Isolation Precautions Code Status Info: (Full Code. ) Allergies Info: (No Known Allergies. )     Isolation Precautions Info: (MRSA Nasal Swab. )     Current Medications (09/28/2017):  This is the current hospital active medication list Current Facility-Administered Medications  Medication Dose Route Frequency Provider Last Rate Last Dose  . 0.9 %  sodium chloride infusion   Intravenous Continuous Hooten, Laurice Record, MD      . acetaminophen (OFIRMEV) IV 1,000 mg  1,000 mg Intravenous Q6H Hooten, Laurice Record, MD      . Derrill Memo ON 09/29/2017] acetaminophen (TYLENOL) tablet 325-650 mg  325-650 mg Oral Q6H PRN Hooten, Laurice Record, MD      . albuterol (PROVENTIL) (2.5 MG/3ML) 0.083% nebulizer solution 2.5 mg  2.5 mg Nebulization Q4H PRN Hooten, Laurice Record, MD      . alum & mag hydroxide-simeth (MAALOX/MYLANTA) 200-200-20 MG/5ML suspension 30 mL  30 mL Oral Q4H PRN Hooten, Laurice Record, MD      . amLODipine (NORVASC) tablet 2.5 mg  2.5 mg Oral Daily Hooten, Laurice Record, MD      . azelastine (ASTELIN) 0.1 %  nasal spray 2 spray  2 spray Each Nare BID Hooten, Laurice Record, MD      . bisacodyl (DULCOLAX) suppository 10 mg  10 mg Rectal Daily PRN Hooten, Laurice Record, MD      . ceFAZolin (ANCEF) IVPB 2g/100 mL premix  2 g Intravenous Q6H Hooten, Laurice Record, MD      . celecoxib (CELEBREX) capsule 200 mg  200 mg Oral BID Hooten, Laurice Record, MD      . diphenhydrAMINE (BENADRYL) 12.5 MG/5ML elixir 12.5-25 mg  12.5-25 mg Oral Q4H PRN Hooten, Laurice Record, MD      . DULoxetine (CYMBALTA) DR capsule 120 mg  120 mg Oral Daily Hooten, Laurice Record, MD      . Derrill Memo ON  09/29/2017] enoxaparin (LOVENOX) injection 30 mg  30 mg Subcutaneous Q12H Hooten, Laurice Record, MD      . ferrous sulfate tablet 325 mg  325 mg Oral BID WC Hooten, Laurice Record, MD      . gabapentin (NEURONTIN) capsule 600 mg  600 mg Oral TID Hooten, Laurice Record, MD      . hydrocortisone cream 1 % 1 application  1 application Topical QID PRN Hooten, Laurice Record, MD      . HYDROmorphone (DILAUDID) injection 0.5-1 mg  0.5-1 mg Intravenous Q4H PRN Hooten, Laurice Record, MD      . lidocaine (LIDODERM) 5 % 1 patch  1 patch Transdermal Q24H Hooten, Laurice Record, MD      . loratadine (CLARITIN) tablet 10 mg  10 mg Oral Daily Hooten, Laurice Record, MD      . magnesium hydroxide (MILK OF MAGNESIA) suspension 30 mL  30 mL Oral Daily PRN Hooten, Laurice Record, MD      . menthol-cetylpyridinium (CEPACOL) lozenge 3 mg  1 lozenge Oral PRN Hooten, Laurice Record, MD       Or  . phenol (CHLORASEPTIC) mouth spray 1 spray  1 spray Mouth/Throat PRN Hooten, Laurice Record, MD      . metoCLOPramide (REGLAN) tablet 5-10 mg  5-10 mg Oral Q8H PRN Hooten, Laurice Record, MD       Or  . metoCLOPramide (REGLAN) injection 5-10 mg  5-10 mg Intravenous Q8H PRN Hooten, Laurice Record, MD      . metoCLOPramide (REGLAN) tablet 10 mg  10 mg Oral TID AC & HS Hooten, Laurice Record, MD      . nortriptyline (PAMELOR) capsule 25 mg  25 mg Oral BID Hooten, Laurice Record, MD      . olopatadine (PATANOL) 0.1 % ophthalmic solution 1 drop  1 drop Right Eye BID Hooten, Laurice Record, MD      . ondansetron (ZOFRAN) tablet 4 mg  4 mg Oral Q6H PRN Hooten, Laurice Record, MD       Or  . ondansetron (ZOFRAN) injection 4 mg  4 mg Intravenous Q6H PRN Hooten, Laurice Record, MD      . oxyCODONE (Oxy IR/ROXICODONE) immediate release tablet 10 mg  10 mg Oral Q4H PRN Hooten, Laurice Record, MD      . oxyCODONE (Oxy IR/ROXICODONE) immediate release tablet 5 mg  5 mg Oral Q4H PRN Hooten, Laurice Record, MD      . pantoprazole (PROTONIX) EC tablet 40 mg  40 mg Oral BID Hooten, Laurice Record, MD      . polyvinyl alcohol (LIQUIFILM TEARS) 1.4 % ophthalmic solution 1 drop   1 drop Both Eyes BID Hooten, Laurice Record, MD      . pravastatin (PRAVACHOL) tablet 40  mg  40 mg Oral QPM Hooten, Laurice Record, MD      . senna-docusate (Senokot-S) tablet 1 tablet  1 tablet Oral BID Hooten, Laurice Record, MD      . sodium phosphate (FLEET) 7-19 GM/118ML enema 1 enema  1 enema Rectal Once PRN Hooten, Laurice Record, MD      . torsemide (DEMADEX) tablet 5 mg  5 mg Oral Daily Hooten, Laurice Record, MD      . traMADol (ULTRAM) tablet 50-100 mg  50-100 mg Oral Q4H PRN Hooten, Laurice Record, MD      . traZODone (DESYREL) tablet 200 mg  200 mg Oral QHS Hooten, Laurice Record, MD         Discharge Medications: Please see discharge summary for a list of discharge medications.  Relevant Imaging Results:  Relevant Lab Results:   Additional Information (SSN: [id number])  Akansha Wyche, Veronia Beets, LCSW

## 2017-09-28 NOTE — Discharge Instructions (Signed)
Instructions after Total Hip Replacement ° ° °  Nikolette Reindl P. Montez Stryker, Jr., M.D.    ° Dept. of Orthopaedics & Sports Medicine ° [facility] ° [address] ° [place], [place]  [address] ° Phone: [phone]   Fax: [phone] ° °  °DIET: °• Drink plenty of non-alcoholic fluids. °• Resume your normal diet. Include foods high in fiber. ° °ACTIVITY:  °• You may use crutches or a walker with weight-bearing as tolerated, unless instructed otherwise. °• You may be weaned off of the walker or crutches by your Physical Therapist.  °• Do NOT reach below the level of your knees or cross your legs until allowed.    °• Continue doing gentle exercises. Exercising will reduce the pain and swelling, increase motion, and prevent muscle weakness.   °• Please continue to use the TED compression stockings for 6 weeks. You may remove the stockings at night, but should reapply them in the morning. °• Do not drive or operate any equipment until instructed. ° °WOUND CARE:  °• Continue to use ice packs periodically to reduce pain and swelling. °• Keep the incision clean and dry. °• You may bathe or shower after the staples are removed at the first office visit following surgery. ° °MEDICATIONS: °• You may resume your regular medications. °• Please take the pain medication as prescribed on the medication. °• Do not take pain medication on an empty stomach. °• You have been given a prescription for a blood thinner to prevent blood clots. Please take the medication as instructed. (NOTE: After completing a 2 week course of Lovenox, take one Enteric-coated aspirin once a day.) °• Pain medications and iron supplements can cause constipation. Use a stool softener (Senokot or Colace) on a daily basis and a laxative (dulcolax or miralax) as needed. °• Do not drive or drink alcoholic beverages when taking pain medications. ° °CALL THE OFFICE FOR: °• Temperature above 101 degrees °• Excessive bleeding or drainage on the dressing. °• Excessive  swelling, coldness, or paleness of the toes. °• Persistent nausea and vomiting. ° °FOLLOW-UP:  °• You should have an appointment to return to the office in 6 weeks after surgery. °• Arrangements have been made for continuation of Physical Therapy (either home therapy or outpatient therapy). °  °

## 2017-09-28 NOTE — Anesthesia Preprocedure Evaluation (Signed)
Anesthesia Evaluation  Patient identified by MRN, date of birth, ID band Patient awake    Reviewed: Allergy & Precautions, H&P , NPO status , Patient's Chart, lab work & pertinent test results, reviewed documented beta blocker date and time   History of Anesthesia Complications Negative for: history of anesthetic complications  Airway Mallampati: III  TM Distance: >3 FB Neck ROM: full    Dental no notable dental hx. (+) Edentulous Upper, Edentulous Lower, Upper Dentures, Lower Dentures   Pulmonary neg shortness of breath, neg sleep apnea, pneumonia, resolved, neg COPD, neg recent URI, Current Smoker,    Pulmonary exam normal breath sounds clear to auscultation       Cardiovascular Exercise Tolerance: Good hypertension, On Medications (-) angina+ Peripheral Vascular Disease and +CHF  (-) CAD, (-) Past MI, (-) Cardiac Stents and (-) CABG Normal cardiovascular exam(-) dysrhythmias (-) Valvular Problems/Murmurs Rhythm:regular Rate:Normal     Neuro/Psych PSYCHIATRIC DISORDERS (Depression) negative neurological ROS     GI/Hepatic negative GI ROS, Neg liver ROS,   Endo/Other  neg diabetesMorbid obesity  Renal/GU negative Renal ROS  negative genitourinary   Musculoskeletal   Abdominal   Peds  Hematology negative hematology ROS (+)   Anesthesia Other Findings Past Medical History:   Hypertension                                                 CHF (congestive heart failure) (HCC)                           Comment:? on office note2 /16 pcp   Pneumonia                                       12/15          Comment:hx   Depression                                                   Arthritis                                                    Reproductive/Obstetrics negative OB ROS                             Anesthesia Physical  Anesthesia Plan  ASA: III  Anesthesia Plan: General   Post-op Pain  Management:    Induction:   PONV Risk Score and Plan:   Airway Management Planned:   Additional Equipment:   Intra-op Plan:   Post-operative Plan:   Informed Consent: I have reviewed the patients History and Physical, chart, labs and discussed the procedure including the risks, benefits and alternatives for the proposed anesthesia with the patient or authorized representative who has indicated his/her understanding and acceptance.   Dental Advisory Given  Plan Discussed with: Anesthesiologist, CRNA and Surgeon  Anesthesia Plan Comments:  Anesthesia Quick Evaluation  

## 2017-09-28 NOTE — Op Note (Signed)
OPERATIVE NOTE  DATE OF SURGERY:  09/28/2017  PATIENT NAME:  Brittany Burke   DOB: [birth year]  MRN: [id number]  PRE-OPERATIVE DIAGNOSIS: Degenerative arthrosis of the right hip, primary  POST-OPERATIVE DIAGNOSIS:  Same  PROCEDURE:  Right total hip arthroplasty  SURGEON:  Marciano Sequin. M.D.  ASSISTANT:  Vance Peper, PA (present and scrubbed throughout the case, critical for assistance with exposure, retraction, instrumentation, and closure)  ANESTHESIA: general  ESTIMATED BLOOD LOSS: 2200 mL  FLUIDS REPLACED: 1000 mL of crystalloid  DRAINS: 2 medium drains to a Hemovac reservoir  IMPLANTS UTILIZED: DePuy 12 mm small stature AML femoral stem, 48 mm OD Pinnacle 100 acetabular component, +4 mm neutral Pinnacle Marathon polyethylene insert, and a 32 mm CoCr +1 mm hip ball  INDICATIONS FOR SURGERY: Brittany Burke is a 72 y.o. year old female with a long history of progressive hip and groin  pain. X-rays demonstrated severe degenerative changes. The patient had not seen any significant improvement despite conservative nonsurgical intervention. After discussion of the risks and benefits of surgical intervention, the patient expressed understanding of the risks benefits and agree with plans for total hip arthroplasty.   The risks, benefits, and alternatives were discussed at length including but not limited to the risks of infection, bleeding, nerve injury, stiffness, blood clots, the need for revision surgery, limb length inequality, dislocation, cardiopulmonary complications, among others, and they were willing to proceed.  PROCEDURE IN DETAIL: The patient was brought into the operating room and, after adequate general anesthesia was achieved, the patient was placed in a left lateral decubitus position. Axillary roll was placed and all bony prominences were well-padded. The patient's right hip was cleaned and prepped with alcohol and DuraPrep and draped in the usual sterile fashion. A  "timeout" was performed as per usual protocol. A lateral curvilinear incision was made gently curving towards the posterior superior iliac spine. The IT band was incised in line with the skin incision and the fibers of the gluteus maximus were split in line. The piriformis tendon was identified, skeletonized, and incised at its insertion to the proximal femur and reflected posteriorly. A T type posterior capsulotomy was performed. Prior to dislocation of the femoral head, a threaded Steinmann pin was inserted through a separate stab incision into the pelvis superior to the acetabulum and bent in the form of a stylus so as to assess limb length and hip offset throughout the procedure. The femoral head was then dislocated posteriorly. Inspection of the femoral head demonstrated severe degenerative changes with full-thickness loss of articular cartilage. The femoral neck cut was performed using an oscillating saw. The anterior capsule was elevated off of the femoral neck using a periosteal elevator. Attention was then directed to the acetabulum. The remnant of the labrum was excised using electrocautery. Inspection of the acetabulum also demonstrated significant degenerative changes. The acetabulum was reamed in sequential fashion up to a 47 mm diameter. Good punctate bleeding bone was encountered. A 48 mm Pinnacle 100 acetabular component was positioned and impacted into place. Good scratch fit was appreciated. A +4 mm polyethylene trial was inserted.  Attention was then directed to the proximal femur. A hole for reaming of the proximal femoral canal was created using a high-speed burr. The femoral canal was reamed in sequential fashion up to a 11.5 mm diameter. This allowed for approximately 7 cm of scratch fit.  It was thus elected to ream up to a 12 mm diameter to allow for a line to line  fit.  Serial broaches were inserted up to a 12 mm small stature femoral broach. Calcar region was planed and a trial reduction  was performed using a 32 mm hip ball with a +1 mm neck length. Good equalization of limb lengths and hip offset was appreciated and excellent stability was noted both anteriorly and posteriorly. Trial components were removed. The acetabular shell was irrigated with copious amounts of normal saline with antibiotic solution and suctioned dry. A +4 mm neutral Pinnacle Marathon polyethylene insert was positioned and impacted into place. Next, a 12 mm small stature AML femoral stem was positioned and impacted into place. Excellent scratch fit was appreciated. A trial reduction was again performed with a 32 mm hip ball with a +1 mm neck length. Again, good equalization of limb lengths was appreciated and excellent stability appreciated both anteriorly and posteriorly. The hip was then dislocated and the trial hip ball was removed. The Morse taper was cleaned and dried. A 32 mm cobalt chrome hip ball with a +1 mm neck length was placed on the trunnion and impacted into place. The hip was then reduced and placed through range of motion. Excellent stability was appreciated both anteriorly and posteriorly.  The wound was irrigated with copious amounts of normal saline with antibiotic solution and suctioned dry. Good hemostasis was appreciated. The posterior capsulotomy was repaired using #5 Ethibond. Piriformis tendon was reapproximated to the undersurface of the gluteus medius tendon using #5 Ethibond. Two medium drains were placed in the wound bed and brought out through separate stab incisions to be attached to a Hemovac reservoir. The IT band was reapproximated using interrupted sutures of #1 Vicryl. Subcutaneous tissue was approximated using first #0 Vicryl followed by #2-0 Vicryl. The skin was closed with skin staples.  The patient tolerated the procedure well and was transported to the recovery room in stable condition.   Marciano Sequin., M.D.

## 2017-09-28 NOTE — Anesthesia Post-op Follow-up Note (Signed)
Anesthesia QCDR form completed.        

## 2017-09-29 ENCOUNTER — Encounter: Payer: Self-pay | Admitting: Orthopedic Surgery

## 2017-09-29 MED ORDER — ENOXAPARIN SODIUM 30 MG/0.3ML ~~LOC~~ SOLN: 30.0000 mg | Freq: Two times a day (BID) | SUBCUTANEOUS | 0 refills | Status: DC

## 2017-09-29 MED ORDER — TRAMADOL HCL 50 MG PO TABS: 50.0000 mg | ORAL_TABLET | ORAL | 0 refills | Status: DC | PRN

## 2017-09-29 MED ORDER — OXYCODONE HCL 5 MG PO TABS: 5.0000 mg | ORAL_TABLET | ORAL | 0 refills | Status: DC | PRN

## 2017-09-29 NOTE — Progress Notes (Signed)
Physical Therapy Treatment Patient Details Name: Brittany Burke MRN: [id number] DOB: 1945/09/07 Today's Date: 09/29/2017    History of Present Illness 72 y.o. female s/p posterior approach Right THR 09/28/17.    PT Comments    Pt continues to be motivated and pleasant t/o PT session but is somewhat slow and guarded with exercises, mobility and ambulation.  She was able to walk ~60 ft with walker and showed good safety though she was very fatigued, she did struggle more with getting in/out of bed (R LE) and needed some extra cuing and encouragement with exercises against gravity.  Pt could not recall any precautions w/o cuing.   Follow Up Recommendations  SNF     Equipment Recommendations       Recommendations for Other Services       Precautions / Restrictions Precautions Precautions: Posterior Hip Precaution Booklet Issued: Yes (comment) Restrictions Weight Bearing Restrictions: Yes RLE Weight Bearing: Weight bearing as tolerated    Mobility  Bed Mobility Overal bed mobility: Needs Assistance Bed Mobility: Sit to Supine     Supine to sit: Min assist;Mod assist Sit to supine: Mod assist   General bed mobility comments: Pt unable to lift R LE to get back into bed, needed assist to get to supine  Transfers Overall transfer level: Needs assistance Equipment used: Rolling walker (2 wheeled) Transfers: Sit to/from Stand Sit to Stand: Min assist         General transfer comment: Pt again needed only light assist to keep weight forward during transition to standin with walker  Ambulation/Gait Ambulation/Gait assistance: Min guard Ambulation Distance (Feet): 60 Feet Assistive device: Rolling walker (2 wheeled)       General Gait Details: Pt with more consistent cadence this afternoon, but still highly reliant on the walker (needed standing rest break due to UE fatigue this afternoon).  She was safe but slow and guarded witht he effort.   Stairs              Wheelchair Mobility    Modified Rankin (Stroke Patients Only)       Balance Overall balance assessment: Modified Independent                                          Cognition Arousal/Alertness: Awake/alert Behavior During Therapy: WFL for tasks assessed/performed Overall Cognitive Status: Within Functional Limits for tasks assessed                                        Exercises Total Joint Exercises Ankle Circles/Pumps: Strengthening;10 reps Quad Sets: Strengthening;10 reps Gluteal Sets: Strengthening;10 reps Short Arc Quad: AROM;10 reps Heel Slides: Strengthening;10 reps Hip ABduction/ADduction: AROM;10 reps Straight Leg Raises: AAROM;AROM;10 reps    General Comments        Pertinent Vitals/Pain Pain Assessment: 0-10 Pain Score: 6  Pain Descriptors / Indicators: Aching Pain Intervention(s): Limited activity within patient's tolerance;Monitored during session    Home Living Family/patient expects to be discharged to:: Skilled nursing facility Living Arrangements: Alone Available Help at Discharge: Family;Available PRN/intermittently   Home Access: Level entry   Home Layout: One level Home Equipment: Bedside commode      Prior Function Level of Independence: Needs assistance  Gait / Transfers Assistance Needed: Pt does PT 2x/wk at [facility] /  Homemaking Assistance Needed: Pt. has personal caregivers 3x's a week for 2 hours who assist with errands, cleaning, laundry, and meal preparation.     PT Goals (current goals can now be found in the care plan section) Acute Rehab PT Goals Patient Stated Goal: get back to walking w/o the walker PT Goal Formulation: With patient Time For Goal Achievement: 10/13/17 Potential to Achieve Goals: Fair Progress towards PT goals: Progressing toward goals    Frequency    BID      PT Plan Current plan remains appropriate    Co-evaluation              AM-PAC PT "6  Clicks" Daily Activity  Outcome Measure  Difficulty turning over in bed (including adjusting bedclothes, sheets and blankets)?: A Lot Difficulty moving from lying on back to sitting on the side of the bed? : Unable Difficulty sitting down on and standing up from a chair with arms (e.g., wheelchair, bedside commode, etc,.)?: Unable Help needed moving to and from a bed to chair (including a wheelchair)?: A Little Help needed walking in hospital room?: A Little Help needed climbing 3-5 steps with a railing? : A Lot 6 Click Score: 12    End of Session Equipment Utilized During Treatment: Gait belt Activity Tolerance: Patient tolerated treatment well;Patient limited by fatigue Patient left: with bed alarm set;with call bell/phone within reach Nurse Communication: Mobility status PT Visit Diagnosis: Muscle weakness (generalized) (M62.81);Difficulty in walking, not elsewhere classified (R26.2)     Time: 6568-1275 PT Time Calculation (min) (ACUTE ONLY): 41 min  Charges:  $Gait Training: 8-22 mins $Therapeutic Exercise: 23-37 mins                    G Codes:       Kreg Shropshire, DPT 09/29/2017, 4:08 PM

## 2017-09-29 NOTE — Progress Notes (Signed)
Clinical Education officer, museum (CSW) met with patient and presented bed offers. She chose [facility] because [facility] does not have a bed available. Plan is for patient to D/C to [facility] tomorrow pending medical clearance and [facility] will transport at 1 pm. Neoma Laming admissions coordinator at [facility] is aware of above. Sharita from [facility] is aware of above. RN aware of above.   [employer], LCSW [phone]

## 2017-09-29 NOTE — Evaluation (Signed)
Physical Therapy Evaluation Patient Details Name: Brittany Burke MRN: [id number] DOB: 23-Apr-1946 Today's Date: 09/29/2017   History of Present Illness  72 y.o. female s/p posterior approach Right THR 09/28/17.  Clinical Impression  Pt did well with PT exam but is limited with mobility and needs assist with most R LE exercises, though she tried very well with exercises and with increased reps showed increased AROM.  Pt was able to ambulate in the room with slow and guarded cadence but is safe.  Educated on precautions with some understanding.  Pt pleasant and hard working t/o session and eager to work with PT.  Overall pt did well but will need rehab on discharge.   Follow Up Recommendations SNF    Equipment Recommendations       Recommendations for Other Services       Precautions / Restrictions Precautions Precautions: Posterior Hip Precaution Booklet Issued: Yes (comment) Restrictions Weight Bearing Restrictions: Yes RLE Weight Bearing: Weight bearing as tolerated      Mobility  Bed Mobility Overal bed mobility: Needs Assistance Bed Mobility: Supine to Sit     Supine to sit: Min assist;Mod assist     General bed mobility comments: Pt needed assist to get R LE to EOB, but showed good effort with hip shift and lifting trunk to upright  Transfers Overall transfer level: Needs assistance Equipment used: Rolling walker (2 wheeled) Transfers: Sit to/from Stand Sit to Stand: Min assist         General transfer comment: Pt needed only light cuing for set up and safety, minimal phyiscal assist to shift weight forward and up  Ambulation/Gait Ambulation/Gait assistance: Min guard Ambulation Distance (Feet): 30 Feet Assistive device: Rolling walker (2 wheeled)       General Gait Details: Pt with stop-go ambulation but seemed confident with R LE WBing and generally was safe and relatively consistent with slow cadence.   Stairs            Wheelchair Mobility     Modified Rankin (Stroke Patients Only)       Balance Overall balance assessment: Modified Independent                                           Pertinent Vitals/Pain Pain Assessment: 0-10 Pain Score: 7  Pain Descriptors / Indicators: Aching Pain Intervention(s): Limited activity within patient's tolerance;Monitored during session    Home Living Family/patient expects to be discharged to:: Skilled nursing facility Living Arrangements: Alone Available Help at Discharge: Family;Available PRN/intermittently   Home Access: Level entry     Home Layout: One level Home Equipment: Bedside commode      Prior Function Level of Independence: Needs assistance   Gait / Transfers Assistance Needed: Pt does PT 2x/wk at [facility]  ADL's / Homemaking Assistance Needed: Pt. has personal caregivers 3x's a week for 2 hours who assist with errands, cleaning, laundry, and meal preparation.        Hand Dominance   Dominant Hand: Right    Extremity/Trunk Assessment   Upper Extremity Assessment Upper Extremity Assessment: Generalized weakness    Lower Extremity Assessment Lower Extremity Assessment: Generalized weakness(expected post op R sided weakness)       Communication   Communication: No difficulties  Cognition Arousal/Alertness: Awake/alert Behavior During Therapy: WFL for tasks assessed/performed Overall Cognitive Status: Within Functional Limits for tasks assessed  General Comments      Exercises Total Joint Exercises Ankle Circles/Pumps: Strengthening;10 reps Quad Sets: Strengthening;10 reps Gluteal Sets: Strengthening;10 reps Short Arc Quad: AROM;10 reps Heel Slides: Strengthening;10 reps Hip ABduction/ADduction: AROM;10 reps   Assessment/Plan    PT Assessment Patient needs continued PT services  PT Problem List Decreased strength;Decreased range of motion;Decreased activity  tolerance;Decreased balance;Decreased mobility;Decreased coordination;Decreased knowledge of use of DME;Decreased safety awareness;Decreased knowledge of precautions;Pain       PT Treatment Interventions DME instruction;Gait training;Stair training;Functional mobility training;Therapeutic activities;Therapeutic exercise;Balance training;Neuromuscular re-education;Patient/family education    PT Goals (Current goals can be found in the Care Plan section)  Acute Rehab PT Goals Patient Stated Goal: get back to walking w/o the walker PT Goal Formulation: With patient Time For Goal Achievement: 10/13/17 Potential to Achieve Goals: Fair    Frequency BID   Barriers to discharge        Co-evaluation               AM-PAC PT "6 Clicks" Daily Activity  Outcome Measure Difficulty turning over in bed (including adjusting bedclothes, sheets and blankets)?: A Lot Difficulty moving from lying on back to sitting on the side of the bed? : Unable Difficulty sitting down on and standing up from a chair with arms (e.g., wheelchair, bedside commode, etc,.)?: Unable Help needed moving to and from a bed to chair (including a wheelchair)?: A Little Help needed walking in hospital room?: A Little Help needed climbing 3-5 steps with a railing? : A Lot 6 Click Score: 12    End of Session Equipment Utilized During Treatment: Gait belt Activity Tolerance: Patient tolerated treatment well;Patient limited by fatigue Patient left: with chair alarm set;with call bell/phone within reach Nurse Communication: Mobility status PT Visit Diagnosis: Muscle weakness (generalized) (M62.81);Difficulty in walking, not elsewhere classified (R26.2)    Time: 7673-4193 PT Time Calculation (min) (ACUTE ONLY): 39 min   Charges:   PT Evaluation $PT Eval Low Complexity: 1 Low PT Treatments $Gait Training: 8-22 mins $Therapeutic Exercise: 8-22 mins   PT G Codes:        Kreg Shropshire, DPT 09/29/2017, 3:28  PM

## 2017-09-29 NOTE — Progress Notes (Signed)
ORTHOPAEDICS PROGRESS NOTE  PATIENT NAME: Brittany Burke DOB: Aug 16, 1945  MRN: [id number]  POD # 1: Right total hip arthroplasty  Subjective: The patient rested well last night.  She reports the pain to be under good control.  She dangled at bedside as per nursing. No nausea or vomiting.  Objective: Vital signs in last 24 hours: Temp:  [97 F (36.1 C)-98.6 F (37 C)] 98.1 F (36.7 C) (04/30 0413) Pulse Rate:  [74-83] 80 (04/30 0413) Resp:  [10-19] 17 (04/30 0413) BP: (98-138)/(61-77) 104/63 (04/30 0413) SpO2:  [92 %-99 %] 94 % (04/30 0413) Weight:  [108.9 kg (240 lb)] 108.9 kg (240 lb) (04/29 0905)  Intake/Output from previous day: 04/29 0701 - 04/30 0700 In: 5110 [P.O.:720; I.V.:2890; IV Piggyback:1500] Out: 2005 [Urine:1675; Drains:100; Blood:230]  No results for input(s): WBC, HGB, HCT, PLT, K, CL, CO2, BUN, CREATININE, GLUCOSE, CALCIUM, LABPT, INR in the last 72 hours.  EXAM General: Well-developed well-nourished female seen in no apparent discomfort. Lungs: clear to auscultation Cardiac: normal rate and regular rhythm Right lower extremity: Hip dressing shows minimal spotting.  No ecchymosis or thigh swelling.  Homans test is negative. Neurologic: Awake, alert, oriented.  Sensory and motor function are intact.  Assessment: Right total hip arthroplasty  Secondary diagnoses: Spinal stenosis Prediabetes Peripheral vascular disease Peripheral neuropathy Hypertension Hyperlipidemia Depression COPD Congestive heart failure Anxiety  Plan: Today's goals were reviewed with the patient.  Begin physical therapy and Occupational Therapy as per total hip arthroplasty rehab protocol.  Posterior hip precautions are to be followed. Plan is to go Skilled nursing facility after hospital stay. DVT Prophylaxis - Lovenox, Foot Pumps and TED hose  Marvelous Bouwens P. Holley Bouche M.D.

## 2017-09-30 LAB — SURGICAL PATHOLOGY

## 2017-09-30 LAB — HEMOGLOBIN AND HEMATOCRIT, BLOOD
HCT: 32.4 % — ABNORMAL LOW (ref 35.0–47.0)
HEMOGLOBIN: 10.8 g/dL — AB (ref 12.0–16.0)

## 2017-09-30 MED ORDER — SODIUM CHLORIDE 0.9 % IV BOLUS
500.0000 mL | Freq: Once | INTRAVENOUS | Status: AC
  Administered 2017-09-30: 500 mL via INTRAVENOUS

## 2017-09-30 MED ORDER — LACTULOSE 10 GM/15ML PO SOLN: 10.0000 g | Freq: Two times a day (BID) | ORAL | Status: DC | PRN

## 2017-09-30 NOTE — Progress Notes (Signed)
Rept to Dr. Marry Guan in [place]. Pt BP with PT sitting 114/68 p 88, standing 82/54 P 100, sitting recheck 96/69 p 93. Per PT rept, pt was light headed and symptomatic. Per MD order, NS 500cc bolus, monitor BP and check hgb and hct. Pt resting in chair at this time. Comfort and no c/o or s/sx of distress. Will continue to monitor.

## 2017-09-30 NOTE — Clinical Social Work Placement (Signed)
   CLINICAL SOCIAL WORK PLACEMENT  NOTE  Date:  09/30/2017  Patient Details  Name: Brittany Burke MRN: [id number] Date of Birth: Jun 12, 1945  Clinical Social Work is seeking post-discharge placement for this patient at the [place] level of care (*CSW will initial, date and re-position this form in  chart as items are completed):  Yes   Patient/family provided with [facility] Work Department's list of facilities offering this level of care within the geographic area requested by the patient (or if unable, by the patient's family).  Yes   Patient/family informed of their freedom to choose among providers that offer the needed level of care, that participate in Medicare, Medicaid or managed care program needed by the patient, have an available bed and are willing to accept the patient.  Yes   Patient/family informed of [facility]'s ownership interest in [facility] and [facility], as well as of the fact that they are under no obligation to receive care at these facilities.  PASRR submitted to EDS on       PASRR number received on       Existing PASRR number confirmed on 09/28/17     FL2 transmitted to all facilities in geographic area requested by pt/family on 09/28/17     FL2 transmitted to all facilities within larger geographic area on       Patient informed that his/her managed care company has contracts with or will negotiate with certain facilities, including the following:        Yes   Patient/family informed of bed offers received.  Patient chooses bed at [facility] )     Physician recommends and patient chooses bed at      Patient to be transferred to [facility] ) on 09/30/17.  Patient to be transferred to facility by ([facility] will transport patient. )     Patient family notified on 09/30/17 of transfer.  Name of family member notified:  (CSW left patient's relative Diane and voicemail. CSW attemtped to contact  patient's daughter Shirlean Mylar however the phone line was busy. )     PHYSICIAN       Additional Comment:    _______________________________________________ Nicholson Starace, Veronia Beets, LCSW 09/30/2017, 11:15 AM

## 2017-09-30 NOTE — Discharge Summary (Signed)
Physician Discharge Summary  Patient ID: Brittany Burke MRN: [id number] DOB/AGE: July 13, 1945 72 y.o.  Admit date: 09/28/2017 Discharge date: 09/30/2017  Admission Diagnoses:  primary osteoarthritis of LEFT hip   Discharge Diagnoses: Patient Active Problem List   Diagnosis Date Noted  . Depression 09/28/2017  . Hyperlipemia 09/28/2017  . Prediabetes 09/28/2017  . Vitamin deficiency 09/28/2017  . Status post total replacement of hip 09/28/2017  . Anxiety disorder, unspecified 10/20/2014  . Abnormality of gait and mobility 10/20/2014  . Postherpetic neuralgia 10/20/2014  . Primary osteoarthritis of left shoulder 10/11/2014  . Lumbar stenosis with neurogenic claudication 08/23/2014  . Pedal edema 06/28/2014  . Depression, major, in remission ([place]) 01/06/2014  . PVD (peripheral vascular disease) ([place]) 01/06/2014  . Obesity, unspecified 01/06/2014  . Lumbar spondylosis 12/05/2013    Past Medical History:  Diagnosis Date  . Anxiety   . Arthritis   . Benign essential tremor   . CHF (congestive heart failure) ([place])    ? on office note2 /16 pcp  . COPD (chronic obstructive pulmonary disease) ([place])   . DDD (degenerative disc disease), lumbar   . Depression   . Gait abnormality   . HLD (hyperlipidemia)   . Hypertension   . Insomnia   . Peripheral neuropathy   . Peripheral vascular disease ([place])   . Pneumonia 12/15   hx  . Pre-diabetes   . Spinal stenosis, lumbar region with neurogenic claudication   . Urge incontinence   . Vitamin D deficiency      Transfusion: No transfusions on this admission   Consultants (if any):   Discharged Condition: Improved  Hospital Course: Brittany Burke is an 72 y.o. female who was admitted 09/28/2017 with a diagnosis of degenerative arthrosis right hip and went to the operating room on 09/28/2017 and underwent the above named procedures.    Surgeries:Procedure(s): TOTAL HIP ARTHROPLASTY on 09/28/2017  PRE-OPERATIVE DIAGNOSIS:  Degenerative arthrosis of the right hip, primary  POST-OPERATIVE DIAGNOSIS:  Same  PROCEDURE:  Right total hip arthroplasty  SURGEON:  Marciano Sequin. M.D.  ASSISTANT:  Vance Peper, PA (present and scrubbed throughout the case, critical for assistance with exposure, retraction, instrumentation, and closure)  ANESTHESIA: general  ESTIMATED BLOOD LOSS: 2200 mL  FLUIDS REPLACED: 1000 mL of crystalloid  DRAINS: 2 medium drains to a Hemovac reservoir  IMPLANTS UTILIZED: DePuy 12 mm small stature AML femoral stem, 48 mm OD Pinnacle 100 acetabular component, +4 mm neutral Pinnacle Marathon polyethylene insert, and a 32 mm CoCr +1 mm hip ball  INDICATIONS FOR SURGERY: Brittany Burke is a 72 y.o. year old female with a long history of progressive hip and groin  pain. X-rays demonstrated severe degenerative changes. The patient had not seen any significant improvement despite conservative nonsurgical intervention. After discussion of the risks and benefits of surgical intervention, the patient expressed understanding of the risks benefits and agree with plans for total hip arthroplasty.   The risks, benefits, and alternatives were discussed at length including but not limited to the risks of infection, bleeding, nerve injury, stiffness, blood clots, the need for revision surgery, limb length inequality, dislocation, cardiopulmonary complications, among others, and they were willing to proceed.   Patient tolerated the surgery well. No complications .Patient was taken to PACU where she was stabilized and then transferred to the orthopedic floor.  Patient started on Lovenox 30 mg q 12 hrs. Foot pumps applied bilaterally at 80 mm hgb. Heels elevated off bed with rolled towels. No evidence  of DVT. Calves non tender. Negative Homan. Physical therapy started on day #1 for gait training and transfer with OT starting on  day #1 for ADL and assisted devices. Patient has done well with therapy.  Ambulated 60 feet upon being discharged.  Patient's IV And Foley were discontinued on day #1 with Hemovac being discontinued on day #2. Dressing was changed on day 2 prior to patient being discharged   She was given perioperative antibiotics:  Anti-infectives (From admission, onward)   Start     Dose/Rate Route Frequency Ordered Stop   09/28/17 1700  ceFAZolin (ANCEF) IVPB 2g/100 mL premix     2 g 200 mL/hr over 30 Minutes Intravenous Every 6 hours 09/28/17 1548 09/29/17 1659   09/28/17 0853  ceFAZolin (ANCEF) 2-4 GM/100ML-% IVPB    Note to Pharmacy:  Ronnell Freshwater   : cabinet override      09/28/17 0853 09/28/17 1107   09/28/17 0600  ceFAZolin (ANCEF) IVPB 2g/100 mL premix     2 g 200 mL/hr over 30 Minutes Intravenous On call to O.R. 09/27/17 2133 09/28/17 1117   09/27/17 2145  vancomycin (VANCOCIN) IVPB 1000 mg/200 mL premix     1,000 mg 200 mL/hr over 60 Minutes Intravenous  Once 09/27/17 2133 09/28/17 1050    .  She was fitted with AV 1 compression foot pump devices, instructed on heel pumps, early ambulation, and fitted with TED stockings bilaterally for DVT prophylaxis.  She benefited maximally from the hospital stay and there were no complications.    Recent vital signs:  Vitals:   09/30/17 0030 09/30/17 0414  BP: (!) 99/52 (!) 100/55  Pulse: 82 87  Resp:  18  Temp:    SpO2: 94% 90%    Recent laboratory studies:  Lab Results  Component Value Date   HGB 14.4 09/16/2017   HGB 13.0 09/26/2014   HGB 14.5 08/14/2014   Lab Results  Component Value Date   WBC 5.9 09/16/2017   PLT 229 09/16/2017   Lab Results  Component Value Date   INR 0.99 09/16/2017   Lab Results  Component Value Date   NA 138 09/16/2017   K 3.8 09/16/2017   CL 102 09/16/2017   CO2 27 09/16/2017   BUN 16 09/16/2017   CREATININE 0.62 09/16/2017   GLUCOSE 100 (H) 09/16/2017    Discharge Medications:   Allergies as of 09/30/2017   No Known Allergies     Medication List    TAKE  these medications   albuterol 108 (90 Base) MCG/ACT inhaler Commonly known as:  PROVENTIL HFA;VENTOLIN HFA Inhale 1-2 puffs into the lungs every 4 (four) hours as needed for shortness of breath.   albuterol (2.5 MG/3ML) 0.083% nebulizer solution Commonly known as:  PROVENTIL Take 2.5 mg by nebulization every 4 (four) hours as needed for wheezing or shortness of breath (cough).   amLODipine 2.5 MG tablet Commonly known as:  NORVASC Take 2.5 mg by mouth daily.   azelastine 0.1 % nasal spray Commonly known as:  ASTELIN Place 2 sprays into both nostrils 2 (two) times daily.   celecoxib 100 MG capsule Commonly known as:  CELEBREX Take 100 mg by mouth 2 (two) times daily with a meal.   cetirizine 10 MG tablet Commonly known as:  ZYRTEC Take 10 mg by mouth daily.   dextromethorphan-guaiFENesin 30-600 MG 12hr tablet Commonly known as:  MUCINEX DM Take 1-2 tablets by mouth 2 (two) times daily as needed for cough (congestion).  DULoxetine 60 MG capsule Commonly known as:  CYMBALTA Take 120 mg by mouth daily.   enoxaparin 30 MG/0.3ML injection Commonly known as:  LOVENOX Inject 0.3 mLs (30 mg total) into the skin every 12 (twelve) hours. Start taking on:  10/01/2017   gabapentin 600 MG tablet Commonly known as:  NEURONTIN Take 600 mg by mouth 3 (three) times daily.   hydrocortisone cream 1 % Apply 1 application topically 4 (four) times daily as needed for itching.   lidocaine 5 % Commonly known as:  LIDODERM Place 1 patch onto the skin daily. Remove & Discard patch within 12 hours or as directed by MD   naloxone 4 MG/0.1ML Liqd nasal spray kit Commonly known as:  NARCAN Place 1 spray into the nose See admin instructions. One spray in one nostril if needed for overdose. Repeat every 3 minutes as needed.   nortriptyline 25 MG capsule Commonly known as:  PAMELOR Take 25 mg by mouth 2 (two) times daily.   olopatadine 0.1 % ophthalmic solution Commonly known as:   PATANOL Place 1 drop into the right eye 2 (two) times daily.   OPTIVE 0.5-0.9 % ophthalmic solution Generic drug:  carboxymethylcellul-glycerin Place 1 drop into both eyes 2 (two) times daily.   oxyCODONE 5 MG immediate release tablet Commonly known as:  Oxy IR/ROXICODONE Take 1 tablet (5 mg total) by mouth every 4 (four) hours as needed for moderate pain (pain score 4-6).   pravastatin 40 MG tablet Commonly known as:  PRAVACHOL Take 40 mg by mouth every evening.   predniSONE 20 MG tablet Commonly known as:  DELTASONE Take 20 mg by mouth See admin instructions. Take 1 tablet (30m) by mouth three times a day with food as needed for breathing problems. CALL MD IF YOU HAVE TO TAKE THIS MED.   torsemide 5 MG tablet Commonly known as:  DEMADEX Take 5 mg by mouth daily.   traMADol 50 MG tablet Commonly known as:  ULTRAM Take 1-2 tablets (50-100 mg total) by mouth every 4 (four) hours as needed for moderate pain.   traZODone 100 MG tablet Commonly known as:  DESYREL Take 200 mg by mouth at bedtime.            Durable Medical Equipment  (From admission, onward)        Start     Ordered   09/28/17 1549  DME Walker rolling  Once    Question:  Patient needs a walker to treat with the following condition  Answer:  S/P total hip arthroplasty   09/28/17 1548   09/28/17 1549  DME Bedside commode  Once    Question:  Patient needs a bedside commode to treat with the following condition  Answer:  S/P total hip arthroplasty   09/28/17 1548      Diagnostic Studies: Mm 3d Screen Breast Bilateral  Result Date: 09/25/2017 CLINICAL DATA:  Screening. EXAM: DIGITAL SCREENING BILATERAL MAMMOGRAM WITH TOMO AND CAD COMPARISON:  Previous exam(s). ACR Breast Density Category b: There are scattered areas of fibroglandular density. FINDINGS: There are no findings suspicious for malignancy. Images were processed with CAD. IMPRESSION: No mammographic evidence of malignancy. A result letter of this  screening mammogram will be mailed directly to the patient. RECOMMENDATION: Screening mammogram in one year. (Code:SM-B-01Y) BI-RADS CATEGORY  1: Negative. Electronically Signed   By: DFidela SalisburyM.D.   On: 09/25/2017 10:33   Dg Hip Port Unilat With Pelvis 1v Right  Result Date: 09/28/2017 CLINICAL DATA:  Hip replacement EXAM: DG HIP (WITH OR WITHOUT PELVIS) 1V PORT RIGHT COMPARISON:  None. FINDINGS: The patient is status post right hip replacement. Hardware is in good position. Surgical drains are noted. IMPRESSION: Hip replacement as above. Electronically Signed   By: Dorise Bullion III M.D   On: 09/28/2017 15:06    Disposition:   Discharge Instructions    Diet - low sodium heart healthy   Complete by:  As directed    Increase activity slowly   Complete by:  As directed        Contact information for follow-up providers    Dereck Leep, MD On 11/10/2017.   Specialty:  Orthopedic Surgery Why:  at 10:45am Contact information: [address] [facility] [place] [address] [phone]            Contact information for after-discharge care    Destination    [facility] .   Service:  Skilled Nursing Contact information: [address] [place] [phone]                   Signed: Watt Climes 09/30/2017, 6:46 AM

## 2017-09-30 NOTE — Progress Notes (Signed)
Physical Therapy Treatment Patient Details Name: Brittany Burke MRN: [id number] DOB: January 04, 1946 Today's Date: 09/30/2017    History of Present Illness 72 y.o. female s/p posterior approach Right THR 09/28/17.    PT Comments    Patient completing all mobility with min assist this date; distance limited by symptomatic orthostasis (see vitals flowsheet).  RN informed/aware.   Follow Up Recommendations  SNF     Equipment Recommendations       Recommendations for Other Services       Precautions / Restrictions Precautions Precautions: Posterior Hip;Fall Restrictions Weight Bearing Restrictions: Yes RLE Weight Bearing: Weight bearing as tolerated    Mobility  Bed Mobility Overal bed mobility: Needs Assistance Bed Mobility: Supine to Sit     Supine to sit: Min assist     General bed mobility comments: assist for R LE management over edge of bed  Transfers Overall transfer level: Needs assistance Equipment used: Rolling walker (2 wheeled) Transfers: Sit to/from Stand Sit to Stand: Min assist         General transfer comment: cuing for hand placement; limited carry-over between/within session  Ambulation/Gait Ambulation/Gait assistance: Min assist Ambulation Distance (Feet): 5 Feet         General Gait Details: 3-point, step to gait pattern; decreased stance time/weight acceptance R LE. Poor foot clearance.  Additional distance deferred secondary to symptomatic orthostasis.   Stairs             Wheelchair Mobility    Modified Rankin (Stroke Patients Only)       Balance Overall balance assessment: Needs assistance Sitting-balance support: Feet supported;No upper extremity supported Sitting balance-Leahy Scale: Good     Standing balance support: Bilateral upper extremity supported Standing balance-Leahy Scale: Fair                              Cognition Arousal/Alertness: Awake/alert Behavior During Therapy: WFL for tasks  assessed/performed Overall Cognitive Status: Within Functional Limits for tasks assessed                                        Exercises Other Exercises Other Exercises: Seated LE therex, 1x15, AROM for muscular strength/endurance Other Exercises: Orthostatic assessment; see vitals flowsheet    General Comments        Pertinent Vitals/Pain Pain Assessment: 0-10 Pain Score: 6  Pain Location: R LE Pain Descriptors / Indicators: Aching Pain Intervention(s): Limited activity within patient's tolerance;Monitored during session;Repositioned    Home Living                      Prior Function            PT Goals (current goals can now be found in the care plan section) Acute Rehab PT Goals Patient Stated Goal: get back to walking w/o the walker PT Goal Formulation: With patient Time For Goal Achievement: 10/13/17 Potential to Achieve Goals: Fair Progress towards PT goals: Progressing toward goals    Frequency    BID      PT Plan Current plan remains appropriate    Co-evaluation              AM-PAC PT "6 Clicks" Daily Activity  Outcome Measure  Difficulty turning over in bed (including adjusting bedclothes, sheets and blankets)?: Unable Difficulty moving from lying on back to sitting  on the side of the bed? : Unable Difficulty sitting down on and standing up from a chair with arms (e.g., wheelchair, bedside commode, etc,.)?: Unable Help needed moving to and from a bed to chair (including a wheelchair)?: A Little Help needed walking in hospital room?: A Lot Help needed climbing 3-5 steps with a railing? : A Lot 6 Click Score: 10    End of Session Equipment Utilized During Treatment: Gait belt Activity Tolerance: Patient tolerated treatment well;Patient limited by fatigue Patient left: with call bell/phone within reach;with chair alarm set;in chair Nurse Communication: Mobility status PT Visit Diagnosis: Muscle weakness (generalized)  (M62.81);Difficulty in walking, not elsewhere classified (R26.2)     Time: 7353-2992 PT Time Calculation (min) (ACUTE ONLY): 24 min  Charges:  $Therapeutic Exercise: 8-22 mins $Therapeutic Activity: 8-22 mins                    G Codes:       Jakie Debow H. Owens Shark, PT, DPT, NCS 09/30/17, 10:35 AM [phone]

## 2017-09-30 NOTE — Progress Notes (Signed)
   Subjective: 2 Days Post-Op Procedure(s) (LRB): TOTAL HIP ARTHROPLASTY (Right) Patient reports pain as mild.  Complains of more soreness than pain Patient is well, and has had no acute complaints or problems Did well with physical therapy yesterday.  Was able to ambulate 60 feet Plan is to go Rehab after hospital stay. no nausea and no vomiting Patient denies any chest pains or shortness of breath. Objective: Vital signs in last 24 hours: Temp:  [97.6 F (36.4 C)-99.1 F (37.3 C)] 98.4 F (36.9 C) (05/01 0022) Pulse Rate:  [62-88] 87 (05/01 0414) Resp:  [18-20] 18 (05/01 0414) BP: (89-110)/(50-79) 100/55 (05/01 0414) SpO2:  [90 %-100 %] 90 % (05/01 0414) well approximated incision Heels are non tender and elevated off the bed using rolled towels Intake/Output from previous day: 04/30 0701 - 05/01 0700 In: 1216.7 [P.O.:480; I.V.:536.7; IV Piggyback:200] Out: 710 [Urine:650; Drains:60] Intake/Output this shift: Total I/O In: 480 [P.O.:480] Out: 350 [Urine:350]  No results for input(s): HGB in the last 72 hours. No results for input(s): WBC, RBC, HCT, PLT in the last 72 hours. No results for input(s): NA, K, CL, CO2, BUN, CREATININE, GLUCOSE, CALCIUM in the last 72 hours. No results for input(s): LABPT, INR in the last 72 hours.  EXAM General - Patient is Alert, Appropriate and Oriented Extremity - Neurologically intact Neurovascular intact Sensation intact distally Intact pulses distally Dorsiflexion/Plantar flexion intact No cellulitis present Compartment soft Dressing - scant drainage Motor Function - intact, moving foot and toes well on exam.    Past Medical History:  Diagnosis Date  . Anxiety   . Arthritis   . Benign essential tremor   . CHF (congestive heart failure) ([place])    ? on office note2 /16 pcp  . COPD (chronic obstructive pulmonary disease) ([place])   . DDD (degenerative disc disease), lumbar   . Depression   . Gait abnormality   . HLD  (hyperlipidemia)   . Hypertension   . Insomnia   . Peripheral neuropathy   . Peripheral vascular disease ([place])   . Pneumonia 12/15   hx  . Pre-diabetes   . Spinal stenosis, lumbar region with neurogenic claudication   . Urge incontinence   . Vitamin D deficiency     Assessment/Plan: 2 Days Post-Op Procedure(s) (LRB): TOTAL HIP ARTHROPLASTY (Right) Active Problems:   Status post total replacement of hip  Estimated body mass index is 38.74 kg/m as calculated from the following:   Height as of this encounter: 5\' 6"  (1.676 m).   Weight as of this encounter: 108.9 kg (240 lb). Up with therapy Discharge to SNF  Labs: None DVT Prophylaxis - Lovenox, Foot Pumps and TED hose Weight-Bearing as tolerated to right leg Patient may be discharged to rehab when she has a bowel movement today. Please change dressing prior to being discharged  Mauro Arps R. [place] 09/30/2017, 6:42 AM

## 2017-09-30 NOTE — Progress Notes (Signed)
Pt BP after last NS bolus 500 cc IV 96/56 P 89 and 102/60 sitting. No change in pt's condition. Marylee Floras PA aware. Per his order, proceed with d/c.

## 2017-09-30 NOTE — Progress Notes (Signed)
Pt BP sitting after 500 CC 0.9 NACL IV bolus 99/61 p 85. Pt stood for BP and was too trembly to obtain standing BP. Pt repts continued light headedness. No change in light headedness from early today or even yesterday per pt's admission. Pt's hgb 10.8 today. All repted to MD. Per his order, 0.9 NS bolus 500 cc to be repeated and recheck VS after. Hold D/C till this pm to reassess vital signs. Pt has no s/sx of distress and no c/o such when sitting in recliner. Will continue to monitor.

## 2017-09-30 NOTE — Progress Notes (Signed)
Pt was assisted to the [facility] by nurse tech Verdis Frederickson and hemovac on the pt's right hip came out. No bleeding on the site noted. Dressing dry and intact. Dr. Marry Guan paged and made aware, no orders made.

## 2017-09-30 NOTE — Progress Notes (Signed)
Patient is medically stable for D/C to [facility] today. Per Neoma Laming admissions coordinator at [facility] patient can come today to room 108. RN will call report to [facility] will transport at 1 pm. Clinical Social Worker (CSW) sent D/C orders to [facility] via [place]. CSW also sent D/C summary to [facility]. Patient is aware of above. Nicole from [facility] is aware of above. CSW attempted to contact patient's daughter Shirlean Mylar however the phone line was busy. CSW left patient's relative Diane a Advertising account executive. Please reconsult if future social work needs arise. CSW signing off.   [employer], LCSW [phone]

## 2017-09-30 NOTE — Progress Notes (Signed)
Pt ready to [facility] to [facility].  Report called to [name] Malta, RN at [employer]

## 2018-09-01 ENCOUNTER — Encounter: Admission: RE | Payer: Self-pay | Source: Home / Self Care

## 2018-09-01 ENCOUNTER — Ambulatory Visit
Admission: RE | Admit: 2018-09-01 | Payer: Medicare (Managed Care) | Source: Home / Self Care | Admitting: Unknown Physician Specialty

## 2018-09-01 SURGERY — COLONOSCOPY WITH PROPOFOL
Anesthesia: General

## 2018-12-06 ENCOUNTER — Other Ambulatory Visit: Payer: Self-pay | Admitting: Family Medicine

## 2018-12-06 DIAGNOSIS — Z1231 Encounter for screening mammogram for malignant neoplasm of breast: Secondary | ICD-10-CM

## 2019-01-13 ENCOUNTER — Ambulatory Visit
Admission: RE | Admit: 2019-01-13 | Discharge: 2019-01-13 | Disposition: A | Discharge status: Home / Self Care | Payer: Medicare (Managed Care) | Source: Ambulatory Visit | Attending: Family Medicine | Admitting: Family Medicine

## 2019-01-13 DIAGNOSIS — Z1231 Encounter for screening mammogram for malignant neoplasm of breast: Secondary | ICD-10-CM | POA: Diagnosis not present

## 2019-01-17 ENCOUNTER — Other Ambulatory Visit: Payer: Self-pay | Admitting: Family Medicine

## 2019-01-17 DIAGNOSIS — R921 Mammographic calcification found on diagnostic imaging of breast: Secondary | ICD-10-CM

## 2019-01-17 DIAGNOSIS — R928 Other abnormal and inconclusive findings on diagnostic imaging of breast: Secondary | ICD-10-CM

## 2019-01-31 ENCOUNTER — Ambulatory Visit: Payer: Medicare (Managed Care)

## 2019-02-09 ENCOUNTER — Ambulatory Visit
Admission: RE | Admit: 2019-02-09 | Discharge: 2019-02-09 | Disposition: A | Discharge status: Home / Self Care | Payer: Medicare (Managed Care) | Source: Ambulatory Visit | Attending: Family Medicine | Admitting: Family Medicine

## 2019-02-09 ENCOUNTER — Other Ambulatory Visit: Payer: Self-pay | Admitting: Family Medicine

## 2019-02-09 DIAGNOSIS — R928 Other abnormal and inconclusive findings on diagnostic imaging of breast: Secondary | ICD-10-CM

## 2019-02-09 DIAGNOSIS — R921 Mammographic calcification found on diagnostic imaging of breast: Secondary | ICD-10-CM | POA: Insufficient documentation

## 2019-04-05 ENCOUNTER — Ambulatory Visit: Payer: Medicare (Managed Care)

## 2019-04-13 ENCOUNTER — Ambulatory Visit
Admission: RE | Admit: 2019-04-13 | Discharge: 2019-04-13 | Disposition: A | Discharge status: Home / Self Care | Payer: Medicare (Managed Care) | Source: Ambulatory Visit | Attending: Family Medicine | Admitting: Family Medicine

## 2019-04-13 DIAGNOSIS — R928 Other abnormal and inconclusive findings on diagnostic imaging of breast: Secondary | ICD-10-CM

## 2019-04-13 DIAGNOSIS — R921 Mammographic calcification found on diagnostic imaging of breast: Secondary | ICD-10-CM

## 2019-04-13 HISTORY — PX: BREAST BIOPSY: SHX20

## 2019-04-15 LAB — SURGICAL PATHOLOGY

## 2019-04-19 ENCOUNTER — Other Ambulatory Visit
Admission: RE | Admit: 2019-04-19 | Discharge: 2019-04-19 | Disposition: A | Discharge status: Home / Self Care | Payer: Medicare (Managed Care) | Source: Ambulatory Visit | Attending: General Surgery | Admitting: General Surgery

## 2019-04-19 ENCOUNTER — Other Ambulatory Visit: Payer: Self-pay

## 2019-04-19 DIAGNOSIS — Z01812 Encounter for preprocedural laboratory examination: Secondary | ICD-10-CM | POA: Insufficient documentation

## 2019-04-19 DIAGNOSIS — Z20828 Contact with and (suspected) exposure to other viral communicable diseases: Secondary | ICD-10-CM | POA: Insufficient documentation

## 2019-04-20 LAB — SARS CORONAVIRUS 2 (TAT 6-24 HRS): SARS Coronavirus 2: NEGATIVE

## 2019-04-21 ENCOUNTER — Encounter: Payer: Self-pay | Admitting: *Deleted

## 2019-04-22 ENCOUNTER — Ambulatory Visit: Payer: Medicare (Managed Care) | Admitting: Anesthesiology

## 2019-04-22 ENCOUNTER — Ambulatory Visit
Admission: RE | Admit: 2019-04-22 | Discharge: 2019-04-22 | Disposition: A | Discharge status: Home / Self Care | Payer: Medicare (Managed Care) | Attending: General Surgery | Admitting: General Surgery

## 2019-04-22 ENCOUNTER — Encounter
Admission: RE | Disposition: A | Discharge status: Home / Self Care | Payer: Self-pay | Source: Home / Self Care | Attending: General Surgery

## 2019-04-22 DIAGNOSIS — M1990 Unspecified osteoarthritis, unspecified site: Secondary | ICD-10-CM | POA: Insufficient documentation

## 2019-04-22 DIAGNOSIS — I509 Heart failure, unspecified: Secondary | ICD-10-CM | POA: Diagnosis not present

## 2019-04-22 DIAGNOSIS — Z7952 Long term (current) use of systemic steroids: Secondary | ICD-10-CM | POA: Insufficient documentation

## 2019-04-22 DIAGNOSIS — I739 Peripheral vascular disease, unspecified: Secondary | ICD-10-CM | POA: Insufficient documentation

## 2019-04-22 DIAGNOSIS — E669 Obesity, unspecified: Secondary | ICD-10-CM | POA: Insufficient documentation

## 2019-04-22 DIAGNOSIS — Z7901 Long term (current) use of anticoagulants: Secondary | ICD-10-CM | POA: Insufficient documentation

## 2019-04-22 DIAGNOSIS — F17210 Nicotine dependence, cigarettes, uncomplicated: Secondary | ICD-10-CM | POA: Diagnosis not present

## 2019-04-22 DIAGNOSIS — M48062 Spinal stenosis, lumbar region with neurogenic claudication: Secondary | ICD-10-CM | POA: Insufficient documentation

## 2019-04-22 DIAGNOSIS — Z79899 Other long term (current) drug therapy: Secondary | ICD-10-CM | POA: Diagnosis not present

## 2019-04-22 DIAGNOSIS — E559 Vitamin D deficiency, unspecified: Secondary | ICD-10-CM | POA: Diagnosis not present

## 2019-04-22 DIAGNOSIS — I110 Hypertensive heart disease with heart failure: Secondary | ICD-10-CM | POA: Insufficient documentation

## 2019-04-22 DIAGNOSIS — Z86010 Personal history of colonic polyps: Secondary | ICD-10-CM | POA: Insufficient documentation

## 2019-04-22 DIAGNOSIS — E785 Hyperlipidemia, unspecified: Secondary | ICD-10-CM | POA: Diagnosis not present

## 2019-04-22 DIAGNOSIS — K5730 Diverticulosis of large intestine without perforation or abscess without bleeding: Secondary | ICD-10-CM | POA: Diagnosis not present

## 2019-04-22 DIAGNOSIS — F419 Anxiety disorder, unspecified: Secondary | ICD-10-CM | POA: Insufficient documentation

## 2019-04-22 DIAGNOSIS — Z791 Long term (current) use of non-steroidal anti-inflammatories (NSAID): Secondary | ICD-10-CM | POA: Diagnosis not present

## 2019-04-22 DIAGNOSIS — J449 Chronic obstructive pulmonary disease, unspecified: Secondary | ICD-10-CM | POA: Insufficient documentation

## 2019-04-22 DIAGNOSIS — F329 Major depressive disorder, single episode, unspecified: Secondary | ICD-10-CM | POA: Diagnosis not present

## 2019-04-22 DIAGNOSIS — G4700 Insomnia, unspecified: Secondary | ICD-10-CM | POA: Insufficient documentation

## 2019-04-22 HISTORY — DX: Peripheral vascular disease, unspecified: I73.9

## 2019-04-22 HISTORY — DX: Pain in leg, unspecified: M79.606

## 2019-04-22 HISTORY — DX: Polyp of colon: K63.5

## 2019-04-22 HISTORY — DX: Unspecified osteoarthritis, unspecified site: M19.90

## 2019-04-22 HISTORY — DX: Obesity, unspecified: E66.9

## 2019-04-22 HISTORY — DX: Other postherpetic nervous system involvement: B02.29

## 2019-04-22 HISTORY — PX: COLONOSCOPY WITH PROPOFOL: SHX5780

## 2019-04-22 SURGERY — COLONOSCOPY WITH PROPOFOL
Anesthesia: General

## 2019-04-22 MED ORDER — PROPOFOL 10 MG/ML IV BOLUS
INTRAVENOUS | Status: DC | PRN
  Administered 2019-04-22: 20 mg via INTRAVENOUS
  Administered 2019-04-22: 10 mg via INTRAVENOUS

## 2019-04-22 MED ORDER — PROPOFOL 500 MG/50ML IV EMUL
INTRAVENOUS | Status: AC
  Filled 2019-04-22: qty 50

## 2019-04-22 MED ORDER — SODIUM CHLORIDE 0.9 % IV SOLN
INTRAVENOUS | Status: DC
  Administered 2019-04-22: 100 mL via INTRAVENOUS

## 2019-04-22 MED ORDER — SODIUM CHLORIDE 0.9 % IV SOLN
2.0000 g | Freq: Once | INTRAVENOUS | Status: AC
  Administered 2019-04-22: 2 g via INTRAVENOUS
  Filled 2019-04-22: qty 2000

## 2019-04-22 MED ORDER — PROPOFOL 500 MG/50ML IV EMUL
INTRAVENOUS | Status: DC | PRN
  Administered 2019-04-22: 150 ug/kg/min via INTRAVENOUS

## 2019-04-22 MED ORDER — LIDOCAINE HCL (CARDIAC) PF 100 MG/5ML IV SOSY
PREFILLED_SYRINGE | INTRAVENOUS | Status: DC | PRN
  Administered 2019-04-22: 40 mg via INTRAVENOUS

## 2019-04-22 MED ORDER — LIDOCAINE HCL (PF) 2 % IJ SOLN
INTRAMUSCULAR | Status: AC
  Filled 2019-04-22: qty 10

## 2019-04-22 NOTE — Anesthesia Procedure Notes (Signed)
Date/Time: 04/22/2019 9:07 AM Performed by: Doreen Salvage, CRNA Pre-anesthesia Checklist: Patient identified, Emergency Drugs available, Suction available and Patient being monitored Patient Re-evaluated:Patient Re-evaluated prior to induction Oxygen Delivery Method: Nasal cannula Induction Type: IV induction Dental Injury: Teeth and Oropharynx as per pre-operative assessment  Comments: Nasal cannula with etCO2 monitoring

## 2019-04-22 NOTE — Anesthesia Post-op Follow-up Note (Signed)
Anesthesia QCDR form completed.        

## 2019-04-22 NOTE — Op Note (Signed)
[facility] Patient Name: Brittany Burke Procedure Date: 04/22/2019 8:36 AM MRN: [id number] Account #: [id number] Date of Birth: May 13, 1946 Admit Type: Outpatient Age: 73 Room: [facility] ENDO ROOM 3 Gender: Female Note Status: Finalized Procedure:             Colonoscopy Indications:           High risk colon cancer surveillance: Personal history                         of colonic polyps Providers:             Robert Bellow, MD Medicines:             Monitored Anesthesia Care Complications:         No immediate complications. Procedure:             Pre-Anesthesia Assessment:                        - Prior to the procedure, a History and Physical was                         performed, and patient medications, allergies and                         sensitivities were reviewed. The patient's tolerance                         of previous anesthesia was reviewed.                        - The risks and benefits of the procedure and the                         sedation options and risks were discussed with the                         patient. All questions were answered and informed                         consent was obtained.                        After obtaining informed consent, the colonoscope was                         passed under direct vision. Throughout the procedure,                         the patient's blood pressure, pulse, and oxygen                         saturations were monitored continuously. The                         Colonoscope was introduced through the anus and                         advanced to the the cecum, identified by appendiceal  orifice and ileocecal valve. The colonoscopy was                         performed without difficulty. The patient tolerated                         the procedure well. The quality of the bowel                         preparation was excellent. Findings:      A few  medium-mouthed diverticula were found in the sigmoid colon. Impression:            - Diverticulosis in the sigmoid colon.                        - No specimens collected. Recommendation:        - Repeat colonoscopy in 5 years for surveillance. Procedure Code(s):     --- Professional ---                        [id number], Colonoscopy, flexible; diagnostic, including                         collection of specimen(s) by brushing or washing, when                         performed (separate procedure) Diagnosis Code(s):     --- Professional ---                        Z86.010, Personal history of colonic polyps                        K57.30, Diverticulosis of large intestine without                         perforation or abscess without bleeding CPT copyright 2019 American Medical Association. All rights reserved. The codes documented in this report are preliminary and upon coder review may  be revised to meet current compliance requirements. Robert Bellow, MD 04/22/2019 9:34:50 AM This report has been signed electronically. Number of Addenda: 0 Note Initiated On: 04/22/2019 8:36 AM Scope Withdrawal Time: 0 hours 13 minutes 49 seconds  Total Procedure Duration: 0 hours 20 minutes 35 seconds  Estimated Blood Loss:  Estimated blood loss: none. Estimated blood loss: none.      [facility]

## 2019-04-22 NOTE — H&P (Signed)
Brittany Burke [id number] [birth year]     HPI:  73 y/o woman with a history of 3 tubular adenomas in the ascending/ proximal transverse colon in 2016. For repeat study.  Medications Prior to Admission  Medication Sig Dispense Refill Last Dose  . pregabalin (LYRICA) 200 MG capsule Take 200 mg by mouth 2 (two) times daily.   04/22/2019 at 0600  . albuterol (PROVENTIL HFA;VENTOLIN HFA) 108 (90 Base) MCG/ACT inhaler Inhale 1-2 puffs into the lungs every 4 (four) hours as needed for shortness of breath.     Marland Kitchen albuterol (PROVENTIL) (2.5 MG/3ML) 0.083% nebulizer solution Take 2.5 mg by nebulization every 4 (four) hours as needed for wheezing or shortness of breath (cough).     Marland Kitchen amLODipine (NORVASC) 2.5 MG tablet Take 2.5 mg by mouth daily.   04/22/2019 at 0600  . azelastine (ASTELIN) 0.1 % nasal spray Place 2 sprays into both nostrils 2 (two) times daily.     . carboxymethylcellul-glycerin (OPTIVE) 0.5-0.9 % ophthalmic solution Place 1 drop into both eyes 2 (two) times daily.     . celecoxib (CELEBREX) 100 MG capsule Take 100 mg by mouth 2 (two) times daily with a meal.    04/22/2019 at 0600  . cetirizine (ZYRTEC) 10 MG tablet Take 10 mg by mouth daily.   04/22/2019 at 0600  . dextromethorphan-guaiFENesin (MUCINEX DM) 30-600 MG 12hr tablet Take 1-2 tablets by mouth 2 (two) times daily as needed for cough (congestion).     . DULoxetine (CYMBALTA) 60 MG capsule Take 120 mg by mouth daily.    04/22/2019 at 0600  . enoxaparin (LOVENOX) 30 MG/0.3ML injection Inject 0.3 mLs (30 mg total) into the skin every 12 (twelve) hours. 28 Syringe 0   . gabapentin (NEURONTIN) 600 MG tablet Take 600 mg by mouth 3 (three) times daily.   04/22/2019 at 0600  . hydrocortisone cream 1 % Apply 1 application topically 4 (four) times daily as needed for itching.     . lidocaine (LIDODERM) 5 % Place 1 patch onto the skin daily. Remove & Discard patch within 12 hours or as directed by MD     . naloxone (NARCAN) nasal spray 4  mg/0.1 mL Place 1 spray into the nose See admin instructions. One spray in one nostril if needed for overdose. Repeat every 3 minutes as needed.     . nortriptyline (PAMELOR) 25 MG capsule Take 25 mg by mouth 2 (two) times daily.     Marland Kitchen olopatadine (PATANOL) 0.1 % ophthalmic solution Place 1 drop into the right eye 2 (two) times daily.     Marland Kitchen oxyCODONE (OXY IR/ROXICODONE) 5 MG immediate release tablet Take 1 tablet (5 mg total) by mouth every 4 (four) hours as needed for moderate pain (pain score 4-6). 30 tablet 0   . pravastatin (PRAVACHOL) 40 MG tablet Take 40 mg by mouth every evening.     . predniSONE (DELTASONE) 20 MG tablet Take 20 mg by mouth See admin instructions. Take 1 tablet (20mg ) by mouth three times a day with food as needed for breathing problems. CALL MD IF YOU HAVE TO TAKE THIS MED.     Marland Kitchen torsemide (DEMADEX) 5 MG tablet Take 5 mg by mouth daily.     . traMADol (ULTRAM) 50 MG tablet Take 1-2 tablets (50-100 mg total) by mouth every 4 (four) hours as needed for moderate pain. 30 tablet 0   . traZODone (DESYREL) 100 MG tablet Take 200 mg by mouth at bedtime.  No Known Allergies Past Medical History:  Diagnosis Date  . Anxiety   . Arthritis   . Benign essential tremor   . CHF (congestive heart failure) ([place])    ? on office note2 /16 pcp  . Colon polyp   . COPD (chronic obstructive pulmonary disease) ([place])   . DDD (degenerative disc disease), lumbar   . Depression   . Gait abnormality   . HLD (hyperlipidemia)   . Hypertension   . Insomnia   . Leg pain   . Obesity   . Osteoarthritis   . Peripheral neuropathy   . Peripheral vascular disease ([place])   . Pneumonia 12/15   hx  . Postherpetic neuralgia   . Pre-diabetes   . PVD (peripheral vascular disease) ([place])   . Spinal stenosis, lumbar region with neurogenic claudication   . Urge incontinence   . Vitamin D deficiency    Past Surgical History:  Procedure Laterality Date  . ABDOMINAL HYSTERECTOMY  72  . BACK  SURGERY    . BREAST BIOPSY Right 2011   benign  . BREAST BIOPSY Left 04/13/2019   Affirm bx-"X" clip-path pending  . COLONOSCOPY WITH PROPOFOL N/A 04/19/2015   Procedure: COLONOSCOPY WITH PROPOFOL;  Surgeon: Manya Silvas, MD;  Location[facility] ENDOSCOPY;  Service: Endoscopy;  Laterality: N/A;  . LUMBAR LAMINECTOMY/DECOMPRESSION MICRODISCECTOMY N/A 08/23/2014   Procedure: LUMBAR THREE-FOUR, LUMBAR FOUR-FIVE LUMBAR LAMINECTOMY/DECOMPRESSION MICRODISCECTOMY 2 LEVELS;  Surgeon: Newman Pies, MD;  Location: [place] NEURO ORS;  Service: Neurosurgery;  Laterality: N/A;  L34 L45 Laminectomies  . TOTAL HIP ARTHROPLASTY Right 09/28/2017   Procedure: TOTAL HIP ARTHROPLASTY;  Surgeon: Dereck Leep, MD;  Location: [facility] ORS;  Service: Orthopedics;  Laterality: Right;   Social History   Socioeconomic History  . Marital status: Divorced    Spouse name: Not on file  . Number of children: Not on file  . Years of education: Not on file  . Highest education level: Not on file  Occupational History  . Not on file  Social Needs  . Financial resource strain: Not on file  . Food insecurity    Worry: Not on file    Inability: Not on file  . Transportation needs    Medical: Not on file    Non-medical: Not on file  Tobacco Use  . Smoking status: Current Every Day Smoker    Packs/day: 1.00    Years: 44.00    Pack years: 44.00    Types: Cigarettes  . Smokeless tobacco: Never Used  Substance and Sexual Activity  . Alcohol use: No  . Drug use: No  . Sexual activity: Not on file  Lifestyle  . Physical activity    Days per week: Not on file    Minutes per session: Not on file  . Stress: Not on file  Relationships  . Social Herbalist on phone: Not on file    Gets together: Not on file    Attends religious service: Not on file    Active member of club or organization: Not on file    Attends meetings of clubs or organizations: Not on file    Relationship status: Not on file  . Intimate  partner violence    Fear of current or ex partner: Not on file    Emotionally abused: Not on file    Physically abused: Not on file    Forced sexual activity: Not on file  Other Topics Concern  . Not on file  Social History Narrative  . Not on file   Social History   Social History Narrative  . Not on file     ROS: Negative.     PE: HEENT: Negative. Lungs: Clear. Cardio: RR. Forest Gleason Bailey Faiella 04/22/2019   Assessment/Plan:  Proceed with planned endoscopy with IV ampicllin for prophylaxis as the patient is < 2 years s/p joint replacement.

## 2019-04-22 NOTE — Anesthesia Preprocedure Evaluation (Addendum)
Anesthesia Evaluation  Patient identified by MRN, date of birth, ID band Patient awake    Reviewed: Allergy & Precautions, H&P , NPO status , Patient's Chart, lab work & pertinent test results, reviewed documented beta blocker date and time   Airway Mallampati: II   Neck ROM: full    Dental  (+) Poor Dentition   Pulmonary neg pulmonary ROS, pneumonia, COPD, Current Smoker and Patient abstained from smoking.,    Pulmonary exam normal        Cardiovascular Exercise Tolerance: Good hypertension, On Medications + Peripheral Vascular Disease and +CHF  negative cardio ROS Normal cardiovascular exam Rhythm:regular Rate:Normal     Neuro/Psych PSYCHIATRIC DISORDERS Anxiety Depression  Neuromuscular disease negative neurological ROS  negative psych ROS   GI/Hepatic negative GI ROS, Neg liver ROS,   Endo/Other  negative endocrine ROS  Renal/GU negative Renal ROS  negative genitourinary   Musculoskeletal   Abdominal   Peds  Hematology negative hematology ROS (+)   Anesthesia Other Findings Past Medical History: No date: Anxiety No date: Arthritis No date: Benign essential tremor No date: CHF (congestive heart failure) ([facility])     Comment:  ? on office note2 /16 pcp No date: Colon polyp No date: COPD (chronic obstructive pulmonary disease) ([facility]) No date: DDD (degenerative disc disease), lumbar No date: Depression No date: Gait abnormality No date: HLD (hyperlipidemia) No date: Hypertension No date: Insomnia No date: Leg pain No date: Obesity No date: Osteoarthritis No date: Peripheral neuropathy No date: Peripheral vascular disease ([place]) 12/15: Pneumonia     Comment:  hx No date: Postherpetic neuralgia No date: Pre-diabetes No date: PVD (peripheral vascular disease) ([facility]) No date: Spinal stenosis, lumbar region with neurogenic claudication No date: Urge incontinence No date: Vitamin D deficiency Past Surgical  History: 72: ABDOMINAL HYSTERECTOMY No date: BACK SURGERY 2011: BREAST BIOPSY; Right     Comment:  benign 04/13/2019: BREAST BIOPSY; Left     Comment:  Affirm bx-"X" clip-path pending 04/19/2015: COLONOSCOPY WITH PROPOFOL; N/A     Comment:  Procedure: COLONOSCOPY WITH PROPOFOL;  Surgeon: Manya Silvas, MD;  Location: [facility] ENDOSCOPY;  Service:               Endoscopy;  Laterality: N/A; 08/23/2014: LUMBAR LAMINECTOMY/DECOMPRESSION MICRODISCECTOMY; N/A     Comment:  Procedure: LUMBAR THREE-FOUR, LUMBAR FOUR-FIVE LUMBAR               LAMINECTOMY/DECOMPRESSION MICRODISCECTOMY 2 LEVELS;                Surgeon: Newman Pies, MD;  Location: [place] NEURO ORS;                Service: Neurosurgery;  Laterality: N/A;  L34 L45               Laminectomies 09/28/2017: TOTAL HIP ARTHROPLASTY; Right     Comment:  Procedure: TOTAL HIP ARTHROPLASTY;  Surgeon: Dereck Leep, MD;  Location: [facility] ORS;  Service: Orthopedics;               Laterality: Right;   Reproductive/Obstetrics negative OB ROS                             Anesthesia Physical Anesthesia Plan  ASA: III  Anesthesia Plan: General   Post-op Pain  Management:    Induction:   PONV Risk Score and Plan:   Airway Management Planned:   Additional Equipment:   Intra-op Plan:   Post-operative Plan:   Informed Consent: I have reviewed the patients History and Physical, chart, labs and discussed the procedure including the risks, benefits and alternatives for the proposed anesthesia with the patient or authorized representative who has indicated his/her understanding and acceptance.     Dental Advisory Given  Plan Discussed with: CRNA  Anesthesia Plan Comments:        Anesthesia Quick Evaluation

## 2019-04-22 NOTE — Anesthesia Postprocedure Evaluation (Signed)
Anesthesia Post Note  Patient: Brittany Burke  Procedure(s) Performed: COLONOSCOPY WITH PROPOFOL (N/A )  Patient location during evaluation: PACU Anesthesia Type: General Level of consciousness: awake and alert Pain management: pain level controlled Vital Signs Assessment: post-procedure vital signs reviewed and stable Respiratory status: spontaneous breathing, nonlabored ventilation, respiratory function stable and patient connected to nasal cannula oxygen Cardiovascular status: blood pressure returned to baseline and stable Postop Assessment: no apparent nausea or vomiting Anesthetic complications: no     Last Vitals:  Vitals:   04/22/19 0843 04/22/19 0936  BP: 131/75 133/76  Pulse: 88 80  Resp: 16 18  Temp: (!) 36.4 C (!) 36.2 C  SpO2: 97% 99%    Last Pain:  Vitals:   04/22/19 0956  TempSrc:   PainSc: 0-No pain                 Molli Barrows

## 2019-04-22 NOTE — Transfer of Care (Signed)
Immediate Anesthesia Transfer of Care Note  Patient: Brittany Burke  Procedure(s) Performed: Procedure(s): COLONOSCOPY WITH PROPOFOL (N/A)  Patient Location: PACU and Endoscopy Unit  Anesthesia Type:General  Level of Consciousness: sedated  Airway & Oxygen Therapy: Patient Spontanous Breathing and Patient connected to nasal cannula oxygen  Post-op Assessment: Report given to RN and Post -op Vital signs reviewed and stable  Post vital signs: Reviewed and stable  Last Vitals:  Vitals:   04/22/19 0843 04/22/19 0936  BP: 131/75 133/76  Pulse: 88 80  Resp: 16 18  Temp: (!) 36.4 C (!) 36.2 C  SpO2: 0000000 [id number]    Complications: No apparent anesthesia complications

## 2019-04-25 ENCOUNTER — Encounter: Payer: Self-pay | Admitting: General Surgery

## 2019-11-01 ENCOUNTER — Encounter: Payer: Self-pay | Admitting: Ophthalmology

## 2019-11-01 ENCOUNTER — Other Ambulatory Visit: Payer: Self-pay

## 2019-11-04 ENCOUNTER — Other Ambulatory Visit: Payer: Self-pay

## 2019-11-04 ENCOUNTER — Other Ambulatory Visit
Admission: RE | Admit: 2019-11-04 | Discharge: 2019-11-04 | Disposition: A | Discharge status: Home / Self Care | Payer: Medicare (Managed Care) | Source: Ambulatory Visit | Attending: Ophthalmology | Admitting: Ophthalmology

## 2019-11-04 DIAGNOSIS — Z20822 Contact with and (suspected) exposure to covid-19: Secondary | ICD-10-CM | POA: Diagnosis not present

## 2019-11-04 DIAGNOSIS — Z01812 Encounter for preprocedural laboratory examination: Secondary | ICD-10-CM | POA: Diagnosis present

## 2019-11-04 NOTE — Discharge Instructions (Signed)

## 2019-11-05 LAB — SARS CORONAVIRUS 2 (TAT 6-24 HRS): SARS Coronavirus 2: NEGATIVE

## 2019-11-08 ENCOUNTER — Ambulatory Visit: Payer: Medicare (Managed Care) | Admitting: Anesthesiology

## 2019-11-08 ENCOUNTER — Encounter
Admission: RE | Disposition: A | Discharge status: Home / Self Care | Payer: Self-pay | Source: Home / Self Care | Attending: Ophthalmology

## 2019-11-08 ENCOUNTER — Ambulatory Visit
Admission: RE | Admit: 2019-11-08 | Discharge: 2019-11-08 | Disposition: A | Discharge status: Home / Self Care | Payer: Medicare (Managed Care) | Attending: Ophthalmology | Admitting: Ophthalmology

## 2019-11-08 ENCOUNTER — Other Ambulatory Visit: Payer: Self-pay

## 2019-11-08 ENCOUNTER — Encounter: Payer: Self-pay | Admitting: Ophthalmology

## 2019-11-08 DIAGNOSIS — J449 Chronic obstructive pulmonary disease, unspecified: Secondary | ICD-10-CM | POA: Insufficient documentation

## 2019-11-08 DIAGNOSIS — I10 Essential (primary) hypertension: Secondary | ICD-10-CM | POA: Insufficient documentation

## 2019-11-08 DIAGNOSIS — Z79899 Other long term (current) drug therapy: Secondary | ICD-10-CM | POA: Insufficient documentation

## 2019-11-08 DIAGNOSIS — H2511 Age-related nuclear cataract, right eye: Secondary | ICD-10-CM | POA: Diagnosis present

## 2019-11-08 DIAGNOSIS — I340 Nonrheumatic mitral (valve) insufficiency: Secondary | ICD-10-CM | POA: Insufficient documentation

## 2019-11-08 DIAGNOSIS — Z6841 Body Mass Index (BMI) 40.0 and over, adult: Secondary | ICD-10-CM | POA: Diagnosis not present

## 2019-11-08 DIAGNOSIS — F17210 Nicotine dependence, cigarettes, uncomplicated: Secondary | ICD-10-CM | POA: Diagnosis not present

## 2019-11-08 HISTORY — PX: CATARACT EXTRACTION W/PHACO: SHX586

## 2019-11-08 SURGERY — PHACOEMULSIFICATION, CATARACT, WITH IOL INSERTION
Anesthesia: Monitor Anesthesia Care | Site: Eye | Laterality: Right

## 2019-11-08 MED ORDER — MIDAZOLAM HCL 2 MG/2ML IJ SOLN
INTRAMUSCULAR | Status: DC | PRN
  Administered 2019-11-08: 2 mg via INTRAVENOUS

## 2019-11-08 MED ORDER — FENTANYL CITRATE (PF) 100 MCG/2ML IJ SOLN
INTRAMUSCULAR | Status: DC | PRN
  Administered 2019-11-08: 50 ug via INTRAVENOUS

## 2019-11-08 MED ORDER — ARMC OPHTHALMIC DILATING DROPS
1.0000 "application " | OPHTHALMIC | Status: DC | PRN
  Administered 2019-11-08 (×3): 1 via OPHTHALMIC

## 2019-11-08 MED ORDER — EPINEPHRINE PF 1 MG/ML IJ SOLN
INTRAOCULAR | Status: DC | PRN
  Administered 2019-11-08: 95 mL via OPHTHALMIC

## 2019-11-08 MED ORDER — NA CHONDROIT SULF-NA HYALURON 40-17 MG/ML IO SOLN
INTRAOCULAR | Status: DC | PRN
  Administered 2019-11-08: 1 mL via INTRAOCULAR

## 2019-11-08 MED ORDER — BRIMONIDINE TARTRATE-TIMOLOL 0.2-0.5 % OP SOLN
OPHTHALMIC | Status: DC | PRN
  Administered 2019-11-08: 1 [drp] via OPHTHALMIC

## 2019-11-08 MED ORDER — TETRACAINE HCL 0.5 % OP SOLN
1.0000 [drp] | OPHTHALMIC | Status: DC | PRN
  Administered 2019-11-08 (×3): 1 [drp] via OPHTHALMIC

## 2019-11-08 MED ORDER — LIDOCAINE HCL (PF) 2 % IJ SOLN
INTRAOCULAR | Status: DC | PRN
  Administered 2019-11-08: 1 mL

## 2019-11-08 MED ORDER — MOXIFLOXACIN HCL 0.5 % OP SOLN
OPHTHALMIC | Status: DC | PRN
  Administered 2019-11-08: 0.2 mL via OPHTHALMIC

## 2019-11-08 SURGICAL SUPPLY — 20 items
CANNULA ANT/CHMB 27G (MISCELLANEOUS) ×2 IMPLANT
CANNULA ANT/CHMB 27GA (MISCELLANEOUS) ×4 IMPLANT
GLOVE SURG LX 8.0 MICRO (GLOVE) ×1
GLOVE SURG LX STRL 8.0 MICRO (GLOVE) ×1 IMPLANT
GLOVE SURG TRIUMPH 8.0 PF LTX (GLOVE) ×2 IMPLANT
GOWN STRL REUS W/ TWL LRG LVL3 (GOWN DISPOSABLE) ×2 IMPLANT
GOWN STRL REUS W/TWL LRG LVL3 (GOWN DISPOSABLE) ×4
LENS IOL DIOP 23.0 (Intraocular Lens) ×2 IMPLANT
LENS IOL TECNIS MONO 23.0 (Intraocular Lens) IMPLANT
MARKER SKIN DUAL TIP RULER LAB (MISCELLANEOUS) ×2 IMPLANT
NDL FILTER BLUNT 18X1 1/2 (NEEDLE) ×1 IMPLANT
NEEDLE FILTER BLUNT 18X 1/2SAF (NEEDLE) ×1
NEEDLE FILTER BLUNT 18X1 1/2 (NEEDLE) ×1 IMPLANT
PACK EYE AFTER SURG (MISCELLANEOUS) ×2 IMPLANT
PACK OPTHALMIC (MISCELLANEOUS) ×2 IMPLANT
PACK PORFILIO (MISCELLANEOUS) ×2 IMPLANT
SYR 3ML LL SCALE MARK (SYRINGE) ×2 IMPLANT
SYR TB 1ML LUER SLIP (SYRINGE) ×2 IMPLANT
WATER STERILE IRR 250ML POUR (IV SOLUTION) ×2 IMPLANT
WIPE NON LINTING 3.25X3.25 (MISCELLANEOUS) ×2 IMPLANT

## 2019-11-08 NOTE — Anesthesia Preprocedure Evaluation (Signed)
Anesthesia Evaluation  Patient identified by MRN, date of birth, ID band Patient awake    History of Anesthesia Complications Negative for: history of anesthetic complications  Airway Mallampati: II  TM Distance: >3 FB Neck ROM: Full    Dental  (+) Edentulous Lower, Upper Dentures   Pulmonary COPD,  COPD inhaler, Current Smoker (7 cigarettes/day) and Patient abstained from smoking.,    Pulmonary exam normal        Cardiovascular hypertension, Pt. on medications Normal cardiovascular exam  2016 Echo  INTERPRETATION NORMAL LEFT VENTRICULAR SYSTOLIC FUNCTION WITH AN ESTIMATED EF = 55 % NORMAL RIGHT VENTRICULAR SYSTOLIC FUNCTION MILD MITRAL VALVE INSUFFICIENCY NO VALVULAR STENOSIS   Neuro/Psych    GI/Hepatic negative GI ROS, Neg liver ROS,   Endo/Other  Morbid obesity (BMI 41)  Renal/GU negative Renal ROS     Musculoskeletal   Abdominal   Peds  Hematology   Anesthesia Other Findings   Reproductive/Obstetrics                            Anesthesia Physical Anesthesia Plan  ASA: III  Anesthesia Plan: MAC   Post-op Pain Management:    Induction: Intravenous  PONV Risk Score and Plan: 1 and Midazolam, TIVA and Treatment may vary due to age or medical condition  Airway Management Planned: Nasal Cannula and Natural Airway  Additional Equipment: None  Intra-op Plan:   Post-operative Plan:   Informed Consent: I have reviewed the patients History and Physical, chart, labs and discussed the procedure including the risks, benefits and alternatives for the proposed anesthesia with the patient or authorized representative who has indicated his/her understanding and acceptance.       Plan Discussed with: CRNA  Anesthesia Plan Comments:         Anesthesia Quick Evaluation

## 2019-11-08 NOTE — H&P (Signed)
All labs reviewed. Abnormal studies sent to patients PCP when indicated.  Previous H&P reviewed, patient examined, there are NO CHANGES.  Brittany Shelnutt Porfilio6/8/20219:15 AM

## 2019-11-08 NOTE — Op Note (Signed)
PREOPERATIVE DIAGNOSIS:  Nuclear sclerotic cataract of the right eye.   POSTOPERATIVE DIAGNOSIS:  H25.11 Cataract   OPERATIVE PROCEDURE:@   SURGEON:  Birder Robson, MD.   ANESTHESIA:  Anesthesiologist: Page, Adele Barthel, MD CRNA: Jeannene Patella, CRNA  1.      Managed anesthesia care. 2.      0.41ml of Shugarcaine was instilled in the eye following the paracentesis.   COMPLICATIONS:  None.   TECHNIQUE:   Stop and chop   DESCRIPTION OF PROCEDURE:  The patient was examined and consented in the preoperative holding area where the aforementioned topical anesthesia was applied to the right eye and then brought back to the Operating Room where the right eye was prepped and draped in the usual sterile ophthalmic fashion and a lid speculum was placed. A paracentesis was created with the side port blade and the anterior chamber was filled with viscoelastic. A near clear corneal incision was performed with the steel keratome. A continuous curvilinear capsulorrhexis was performed with a cystotome followed by the capsulorrhexis forceps. Hydrodissection and hydrodelineation were carried out with BSS on a blunt cannula. The lens was removed in a stop and chop  technique and the remaining cortical material was removed with the irrigation-aspiration handpiece. The capsular bag was inflated with viscoelastic and the Technis ZCB00  lens was placed in the capsular bag without complication. The remaining viscoelastic was removed from the eye with the irrigation-aspiration handpiece. The wounds were hydrated. The anterior chamber was flushed with BSS and the eye was inflated to physiologic pressure. 0.47ml of Vigamox was placed in the anterior chamber. The wounds were found to be water tight. The eye was dressed with Combigan. The patient was given protective glasses to wear throughout the day and a shield with which to sleep tonight. The patient was also given drops with which to begin a drop regimen today and  will follow-up with me in one day. Implant Name Type Inv. Item Serial No. Manufacturer Lot No. LRB No. Used Action  LENS IOL DIOP 23.0 - [id number] Intraocular Lens LENS IOL DIOP 23.0 [id number] AMO  Right 1 Implanted   Procedure(s): 7.30  00:51.4 (Right)  Electronically signed: Birder Robson 11/08/2019 9:43 AM

## 2019-11-08 NOTE — Anesthesia Procedure Notes (Signed)
Procedure Name: MAC Date/Time: 11/08/2019 9:28 AM Performed by: Jeannene Patella, CRNA Pre-anesthesia Checklist: Emergency Drugs available, Patient identified, Suction available, Patient being monitored and Timeout performed Patient Re-evaluated:Patient Re-evaluated prior to induction Oxygen Delivery Method: Nasal cannula

## 2019-11-08 NOTE — Transfer of Care (Signed)
Immediate Anesthesia Transfer of Care Note  Patient: Brittany Burke  Procedure(s) Performed: 7.30  00:51.4 (Right Eye)  Patient Location: PACU  Anesthesia Type: MAC  Level of Consciousness: awake, alert  and patient cooperative  Airway and Oxygen Therapy: Patient Spontanous Breathing and Patient connected to supplemental oxygen  Post-op Assessment: Post-op Vital signs reviewed, Patient's Cardiovascular Status Stable, Respiratory Function Stable, Patent Airway and No signs of Nausea or vomiting  Post-op Vital Signs: Reviewed and stable  Complications: No apparent anesthesia complications

## 2019-11-08 NOTE — Anesthesia Postprocedure Evaluation (Signed)
Anesthesia Post Note  Patient: Brittany Burke  Procedure(s) Performed: 7.30  00:51.4 (Right Eye)     Patient location during evaluation: PACU Anesthesia Type: MAC Level of consciousness: awake and alert Pain management: pain level controlled Vital Signs Assessment: post-procedure vital signs reviewed and stable Respiratory status: spontaneous breathing Cardiovascular status: blood pressure returned to baseline Postop Assessment: no apparent nausea or vomiting, adequate PO intake and no headache Anesthetic complications: no    Adele Barthel Margarite Vessel

## 2019-11-09 ENCOUNTER — Encounter: Payer: Self-pay | Admitting: *Deleted

## 2019-11-28 ENCOUNTER — Other Ambulatory Visit: Payer: Self-pay

## 2019-11-28 ENCOUNTER — Encounter: Payer: Self-pay | Admitting: Ophthalmology

## 2019-12-01 NOTE — Discharge Instructions (Signed)

## 2019-12-06 ENCOUNTER — Encounter: Payer: Self-pay | Admitting: Ophthalmology

## 2019-12-06 ENCOUNTER — Ambulatory Visit
Admission: RE | Admit: 2019-12-06 | Discharge: 2019-12-06 | Disposition: A | Discharge status: Home / Self Care | Payer: Medicare (Managed Care) | Attending: Ophthalmology | Admitting: Ophthalmology

## 2019-12-06 ENCOUNTER — Other Ambulatory Visit: Payer: Self-pay

## 2019-12-06 ENCOUNTER — Ambulatory Visit: Payer: Medicare (Managed Care) | Admitting: Anesthesiology

## 2019-12-06 ENCOUNTER — Encounter
Admission: RE | Disposition: A | Discharge status: Home / Self Care | Payer: Self-pay | Source: Home / Self Care | Attending: Ophthalmology

## 2019-12-06 DIAGNOSIS — M1990 Unspecified osteoarthritis, unspecified site: Secondary | ICD-10-CM | POA: Insufficient documentation

## 2019-12-06 DIAGNOSIS — H2512 Age-related nuclear cataract, left eye: Secondary | ICD-10-CM | POA: Diagnosis not present

## 2019-12-06 DIAGNOSIS — E1151 Type 2 diabetes mellitus with diabetic peripheral angiopathy without gangrene: Secondary | ICD-10-CM | POA: Insufficient documentation

## 2019-12-06 DIAGNOSIS — F418 Other specified anxiety disorders: Secondary | ICD-10-CM | POA: Diagnosis not present

## 2019-12-06 DIAGNOSIS — E559 Vitamin D deficiency, unspecified: Secondary | ICD-10-CM | POA: Insufficient documentation

## 2019-12-06 DIAGNOSIS — Z79899 Other long term (current) drug therapy: Secondary | ICD-10-CM | POA: Diagnosis not present

## 2019-12-06 DIAGNOSIS — I10 Essential (primary) hypertension: Secondary | ICD-10-CM | POA: Diagnosis not present

## 2019-12-06 DIAGNOSIS — J449 Chronic obstructive pulmonary disease, unspecified: Secondary | ICD-10-CM | POA: Diagnosis not present

## 2019-12-06 DIAGNOSIS — E7800 Pure hypercholesterolemia, unspecified: Secondary | ICD-10-CM | POA: Diagnosis not present

## 2019-12-06 DIAGNOSIS — F17200 Nicotine dependence, unspecified, uncomplicated: Secondary | ICD-10-CM | POA: Insufficient documentation

## 2019-12-06 DIAGNOSIS — E1136 Type 2 diabetes mellitus with diabetic cataract: Secondary | ICD-10-CM | POA: Diagnosis present

## 2019-12-06 DIAGNOSIS — E1142 Type 2 diabetes mellitus with diabetic polyneuropathy: Secondary | ICD-10-CM | POA: Diagnosis not present

## 2019-12-06 DIAGNOSIS — Z6841 Body Mass Index (BMI) 40.0 and over, adult: Secondary | ICD-10-CM | POA: Insufficient documentation

## 2019-12-06 HISTORY — PX: CATARACT EXTRACTION W/PHACO: SHX586

## 2019-12-06 SURGERY — PHACOEMULSIFICATION, CATARACT, WITH IOL INSERTION
Anesthesia: Monitor Anesthesia Care | Site: Eye | Laterality: Left

## 2019-12-06 MED ORDER — LIDOCAINE HCL (PF) 2 % IJ SOLN
INTRAOCULAR | Status: DC | PRN
  Administered 2019-12-06: 1 mL

## 2019-12-06 MED ORDER — NA CHONDROIT SULF-NA HYALURON 40-17 MG/ML IO SOLN
INTRAOCULAR | Status: DC | PRN
  Administered 2019-12-06: 1 mL via INTRAOCULAR

## 2019-12-06 MED ORDER — TETRACAINE HCL 0.5 % OP SOLN
1.0000 [drp] | OPHTHALMIC | Status: DC | PRN
  Administered 2019-12-06 (×3): 1 [drp] via OPHTHALMIC

## 2019-12-06 MED ORDER — ARMC OPHTHALMIC DILATING DROPS
1.0000 "application " | OPHTHALMIC | Status: DC | PRN
  Administered 2019-12-06 (×3): 1 via OPHTHALMIC

## 2019-12-06 MED ORDER — ONDANSETRON HCL 4 MG/2ML IJ SOLN: 4.0000 mg | Freq: Once | INTRAMUSCULAR | Status: DC | PRN

## 2019-12-06 MED ORDER — FENTANYL CITRATE (PF) 100 MCG/2ML IJ SOLN
INTRAMUSCULAR | Status: DC | PRN
  Administered 2019-12-06: 50 ug via INTRAVENOUS

## 2019-12-06 MED ORDER — ACETAMINOPHEN 10 MG/ML IV SOLN: 1000.0000 mg | Freq: Once | INTRAVENOUS | Status: DC | PRN

## 2019-12-06 MED ORDER — BRIMONIDINE TARTRATE-TIMOLOL 0.2-0.5 % OP SOLN
OPHTHALMIC | Status: DC | PRN
  Administered 2019-12-06: 1 [drp] via OPHTHALMIC

## 2019-12-06 MED ORDER — MOXIFLOXACIN HCL 0.5 % OP SOLN
OPHTHALMIC | Status: DC | PRN
  Administered 2019-12-06: 0.2 mL via OPHTHALMIC

## 2019-12-06 MED ORDER — EPINEPHRINE PF 1 MG/ML IJ SOLN
INTRAOCULAR | Status: DC | PRN
  Administered 2019-12-06: 52 mL via OPHTHALMIC

## 2019-12-06 MED ORDER — MIDAZOLAM HCL 2 MG/2ML IJ SOLN
INTRAMUSCULAR | Status: DC | PRN
  Administered 2019-12-06: 1 mg via INTRAVENOUS

## 2019-12-06 MED ORDER — LACTATED RINGERS IV SOLN: INTRAVENOUS | Status: DC

## 2019-12-06 SURGICAL SUPPLY — 20 items
CANNULA ANT/CHMB 27G (MISCELLANEOUS) ×2 IMPLANT
CANNULA ANT/CHMB 27GA (MISCELLANEOUS) ×6 IMPLANT
GLOVE SURG LX 8.0 MICRO (GLOVE) ×2
GLOVE SURG LX STRL 8.0 MICRO (GLOVE) ×1 IMPLANT
GLOVE SURG TRIUMPH 8.0 PF LTX (GLOVE) ×3 IMPLANT
GOWN STRL REUS W/ TWL LRG LVL3 (GOWN DISPOSABLE) ×2 IMPLANT
GOWN STRL REUS W/TWL LRG LVL3 (GOWN DISPOSABLE) ×6
LENS IOL DIOP 24.0 (Intraocular Lens) ×3 IMPLANT
LENS IOL TECNIS MONO 24.0 (Intraocular Lens) IMPLANT
MARKER SKIN DUAL TIP RULER LAB (MISCELLANEOUS) ×3 IMPLANT
NDL FILTER BLUNT 18X1 1/2 (NEEDLE) ×1 IMPLANT
NEEDLE FILTER BLUNT 18X 1/2SAF (NEEDLE) ×2
NEEDLE FILTER BLUNT 18X1 1/2 (NEEDLE) ×1 IMPLANT
PACK EYE AFTER SURG (MISCELLANEOUS) ×3 IMPLANT
PACK OPTHALMIC (MISCELLANEOUS) ×3 IMPLANT
PACK PORFILIO (MISCELLANEOUS) ×3 IMPLANT
SYR 3ML LL SCALE MARK (SYRINGE) ×3 IMPLANT
SYR TB 1ML LUER SLIP (SYRINGE) ×3 IMPLANT
WATER STERILE IRR 250ML POUR (IV SOLUTION) ×3 IMPLANT
WIPE NON LINTING 3.25X3.25 (MISCELLANEOUS) ×3 IMPLANT

## 2019-12-06 NOTE — Anesthesia Preprocedure Evaluation (Signed)
Anesthesia Evaluation  Patient identified by MRN, date of birth, ID band Patient awake    Reviewed: Allergy & Precautions, NPO status , Patient's Chart, lab work & pertinent test results, reviewed documented beta blocker date and time   History of Anesthesia Complications Negative for: history of anesthetic complications  Airway Mallampati: II  TM Distance: >3 FB Neck ROM: Full    Dental  (+) Edentulous Lower, Upper Dentures   Pulmonary COPD,  COPD inhaler, Current Smoker and Patient abstained from smoking.,    Pulmonary exam normal breath sounds clear to auscultation       Cardiovascular hypertension, Pt. on medications (-) angina+ Peripheral Vascular Disease  (-) DOE Normal cardiovascular exam+ dysrhythmias (RBBB)  Rhythm:Regular Rate:Normal   HLD  2016 Echo NORMAL LEFT VENTRICULAR SYSTOLIC FUNCTION WITH AN ESTIMATED EF = 55 % NORMAL RIGHT VENTRICULAR SYSTOLIC FUNCTION MILD MITRAL VALVE INSUFFICIENCY NO VALVULAR STENOSIS   Neuro/Psych PSYCHIATRIC DISORDERS Anxiety Depression  Neuromuscular disease (Peripheral neuropathy, lumbar spinal stenosis w/ neurogenic claudication)    GI/Hepatic neg GERD  ,  Endo/Other  diabetes (Pre-DM)Morbid obesity (BMI 41)  Renal/GU      Musculoskeletal  (+) Arthritis ,   Abdominal   Peds  Hematology   Anesthesia Other Findings   Reproductive/Obstetrics                            Anesthesia Physical  Anesthesia Plan  ASA: III  Anesthesia Plan: MAC   Post-op Pain Management:    Induction: Intravenous  PONV Risk Score and Plan: 1 and Midazolam, TIVA and Treatment may vary due to age or medical condition  Airway Management Planned: Nasal Cannula and Natural Airway  Additional Equipment: None  Intra-op Plan:   Post-operative Plan:   Informed Consent: I have reviewed the patients History and Physical, chart, labs and discussed the procedure  including the risks, benefits and alternatives for the proposed anesthesia with the patient or authorized representative who has indicated his/her understanding and acceptance.       Plan Discussed with: CRNA  Anesthesia Plan Comments:         Anesthesia Quick Evaluation

## 2019-12-06 NOTE — Anesthesia Postprocedure Evaluation (Signed)
Anesthesia Post Note  Patient: Brittany Burke  Procedure(s) Performed: CATARACT EXTRACTION PHACO AND INTRAOCULAR LENS PLACEMENT (IOC) LEFT DIABETIC 4.62  00:35.1 (Left Eye)     Patient location during evaluation: PACU Anesthesia Type: MAC Level of consciousness: awake and alert Pain management: pain level controlled Vital Signs Assessment: post-procedure vital signs reviewed and stable Respiratory status: spontaneous breathing, nonlabored ventilation, respiratory function stable and patient connected to nasal cannula oxygen Cardiovascular status: stable and blood pressure returned to baseline Postop Assessment: no apparent nausea or vomiting Anesthetic complications: no   No complications documented.  Kassadie Pancake A  Jamin Panther

## 2019-12-06 NOTE — Op Note (Signed)
PREOPERATIVE DIAGNOSIS:  Nuclear sclerotic cataract of the left eye.   POSTOPERATIVE DIAGNOSIS:  Nuclear sclerotic cataract of the left eye.   OPERATIVE PROCEDURE: Procedure(s): CATARACT EXTRACTION PHACO AND INTRAOCULAR LENS PLACEMENT (IOC) LEFT DIABETIC 4.62  00:35.1   SURGEON:  Birder Robson, MD.   ANESTHESIA:  Anesthesiologist: Heniser, Fredric Dine, MD CRNA: Vanetta Shawl, CRNA  1.      Managed anesthesia care. 2.     0.59ml of Shugarcaine was instilled following the paracentesis   COMPLICATIONS:  None.   TECHNIQUE:   Stop and chop   DESCRIPTION OF PROCEDURE:  The patient was examined and consented in the preoperative holding area where the aforementioned topical anesthesia was applied to the left eye and then brought back to the Operating Room where the left eye was prepped and draped in the usual sterile ophthalmic fashion and a lid speculum was placed. A paracentesis was created with the side port blade and the anterior chamber was filled with viscoelastic. A near clear corneal incision was performed with the steel keratome. A continuous curvilinear capsulorrhexis was performed with a cystotome followed by the capsulorrhexis forceps. Hydrodissection and hydrodelineation were carried out with BSS on a blunt cannula. The lens was removed in a stop and chop  technique and the remaining cortical material was removed with the irrigation-aspiration handpiece. The capsular bag was inflated with viscoelastic and the Technis ZCB00 lens was placed in the capsular bag without complication. The remaining viscoelastic was removed from the eye with the irrigation-aspiration handpiece. The wounds were hydrated. The anterior chamber was flushed with Miostat and the eye was inflated to physiologic pressure. 0.84ml Vigamox was placed in the anterior chamber. The wounds were found to be water tight. The eye was dressed with Vigamox. The patient was given protective glasses to wear throughout the day and a  shield with which to sleep tonight. The patient was also given drops with which to begin a drop regimen today and will follow-up with me in one day. Implant Name Type Inv. Item Serial No. Manufacturer Lot No. LRB No. Used Action  LENS IOL DIOP 24.0 - [id number] Intraocular Lens LENS IOL DIOP 24.0 [id number] [employer]  Left 1 Implanted    Procedure(s): CATARACT EXTRACTION PHACO AND INTRAOCULAR LENS PLACEMENT (IOC) LEFT DIABETIC 4.62  00:35.1 (Left)  Electronically signed: Birder Robson 12/06/2019 11:06 AM

## 2019-12-06 NOTE — H&P (Signed)
All labs reviewed. Abnormal studies sent to patients PCP when indicated.  Previous H&P reviewed, patient examined, there are NO CHANGES.  Brittany Laday Porfilio7/6/202110:39 AM

## 2019-12-06 NOTE — Anesthesia Procedure Notes (Signed)
Procedure Name: MAC Date/Time: 12/06/2019 10:50 AM Performed by: Vanetta Shawl, CRNA Pre-anesthesia Checklist: Patient identified, Emergency Drugs available, Suction available, Timeout performed and Patient being monitored Patient Re-evaluated:Patient Re-evaluated prior to induction Oxygen Delivery Method: Nasal cannula Placement Confirmation: positive ETCO2

## 2019-12-06 NOTE — Transfer of Care (Signed)
Immediate Anesthesia Transfer of Care Note  Patient: Brittany Burke  Procedure(s) Performed: CATARACT EXTRACTION PHACO AND INTRAOCULAR LENS PLACEMENT (IOC) LEFT DIABETIC 4.62  00:35.1 (Left Eye)  Patient Location: PACU  Anesthesia Type: MAC  Level of Consciousness: awake, alert  and patient cooperative  Airway and Oxygen Therapy: Patient Spontanous Breathing   Post-op Assessment: Post-op Vital signs reviewed, Patient's Cardiovascular Status Stable, Respiratory Function Stable, Patent Airway and No signs of Nausea or vomiting  Post-op Vital Signs: Reviewed and stable  Complications: No complications documented.

## 2019-12-07 ENCOUNTER — Encounter: Payer: Self-pay | Admitting: Ophthalmology

## 2020-05-02 ENCOUNTER — Other Ambulatory Visit: Payer: Self-pay | Admitting: Adult Health

## 2020-05-02 DIAGNOSIS — Z1231 Encounter for screening mammogram for malignant neoplasm of breast: Secondary | ICD-10-CM

## 2020-05-29 ENCOUNTER — Ambulatory Visit
Admission: RE | Admit: 2020-05-29 | Discharge: 2020-05-29 | Disposition: A | Discharge status: Home / Self Care | Payer: Medicare (Managed Care) | Source: Ambulatory Visit | Attending: Adult Health | Admitting: Adult Health

## 2020-05-29 ENCOUNTER — Other Ambulatory Visit: Payer: Self-pay

## 2020-05-29 DIAGNOSIS — Z1231 Encounter for screening mammogram for malignant neoplasm of breast: Secondary | ICD-10-CM | POA: Diagnosis present

## 2021-01-11 IMAGING — MG MM BREAST BX W LOC DEV 1ST LESION IMAGE BX SPEC STEREO GUIDE*L*
8 of 9 series · 8 of 17 positions shown · non-contrast
Comparison: Previous exams.
COMPARISON: Previous exams.

Addendum:
CLINICAL DATA: Patient with indeterminate left breast
calcifications.

EXAM:
LEFT BREAST STEREOTACTIC CORE NEEDLE BIOPSY

[L (1 of 6)]
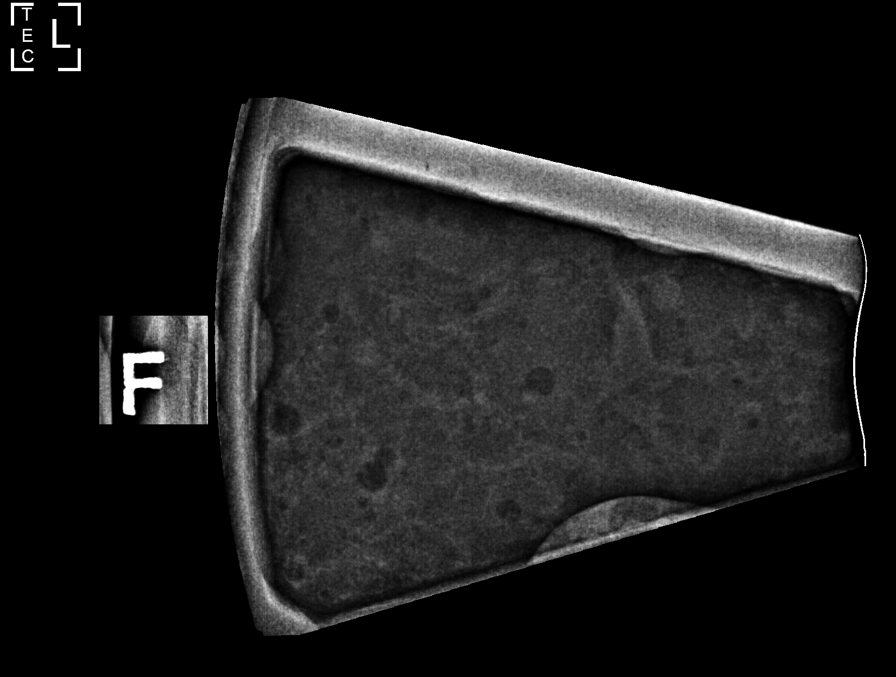

[L (2 of 6)]
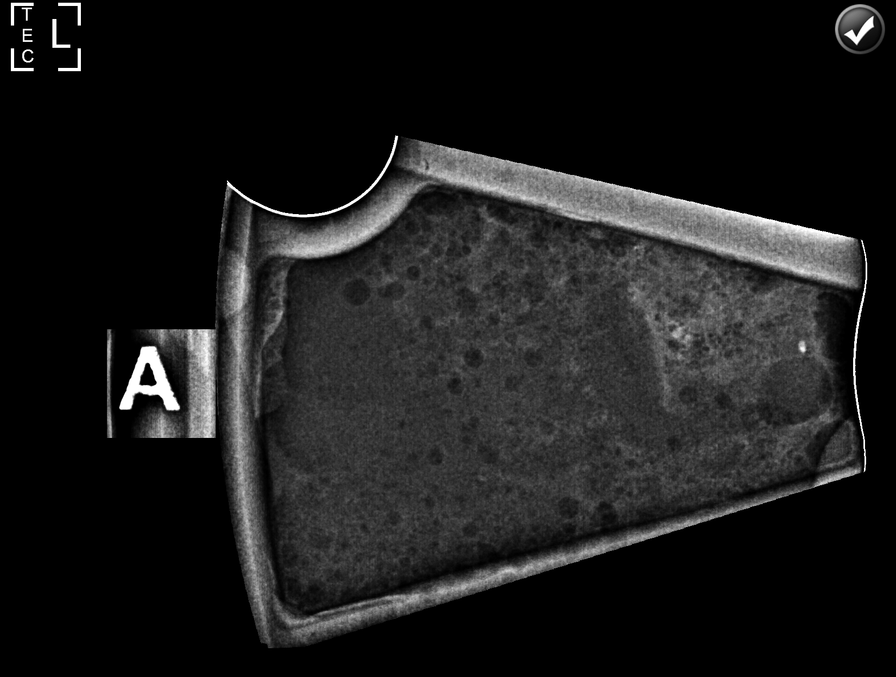

[L (3 of 6)]
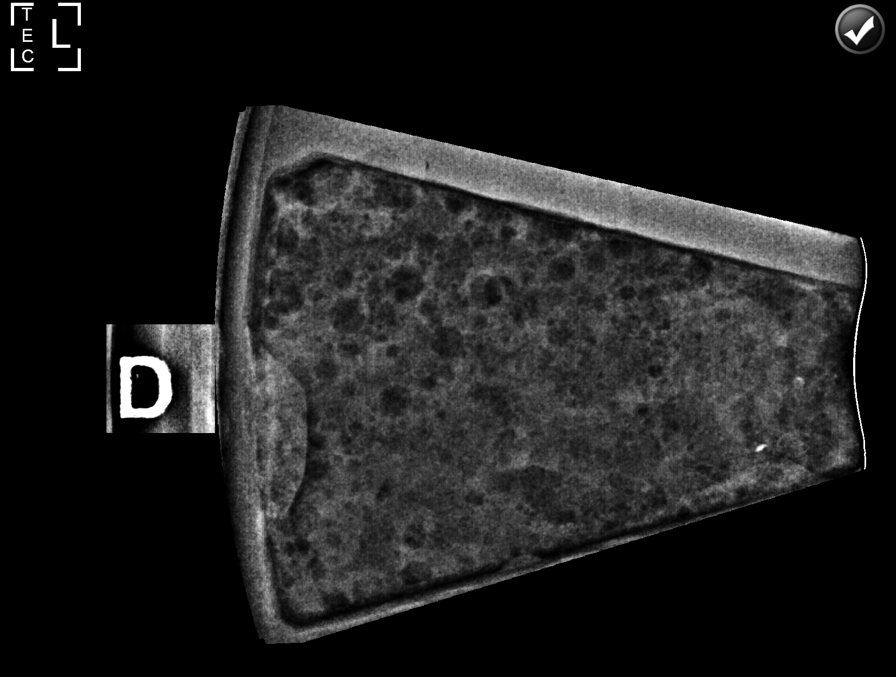

[L (4 of 6)]
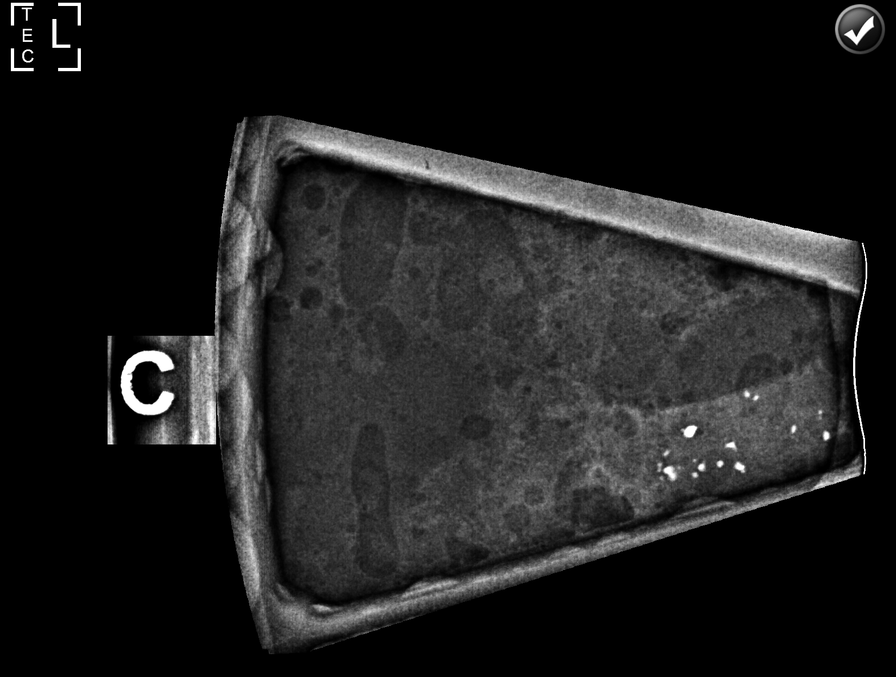

[L (5 of 6)]
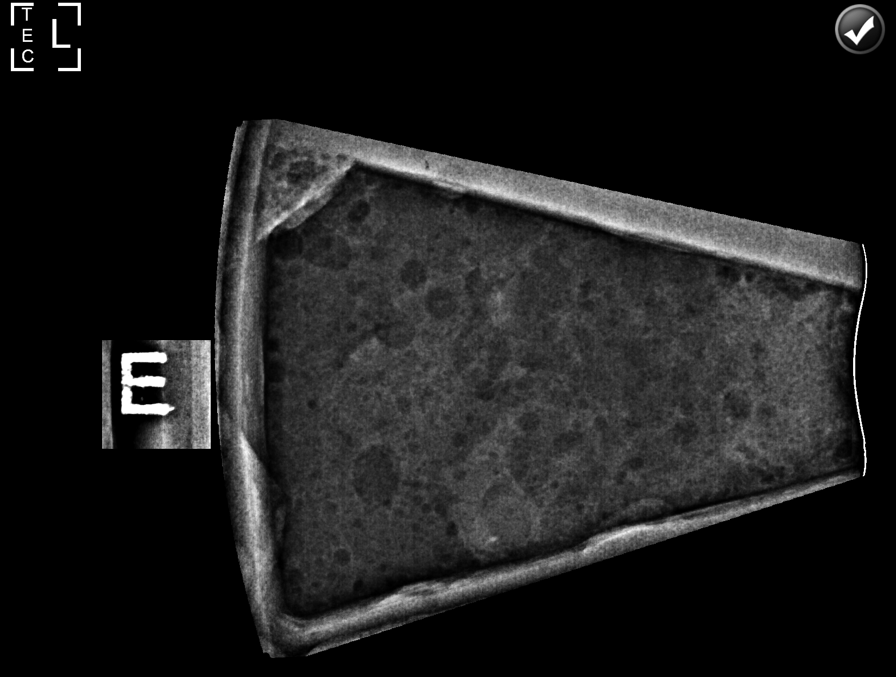

[L (6 of 6)]
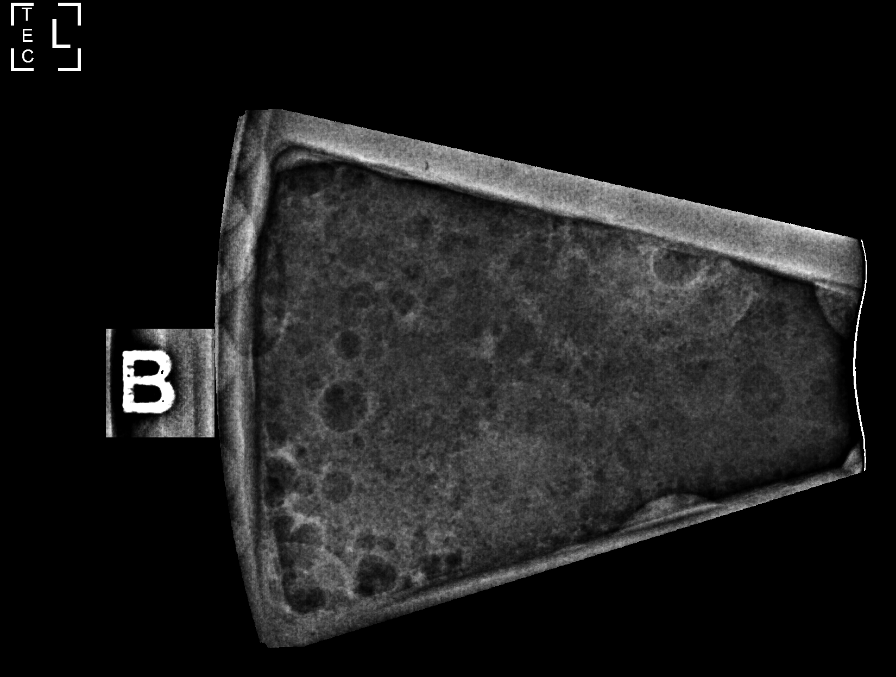

[R CC]
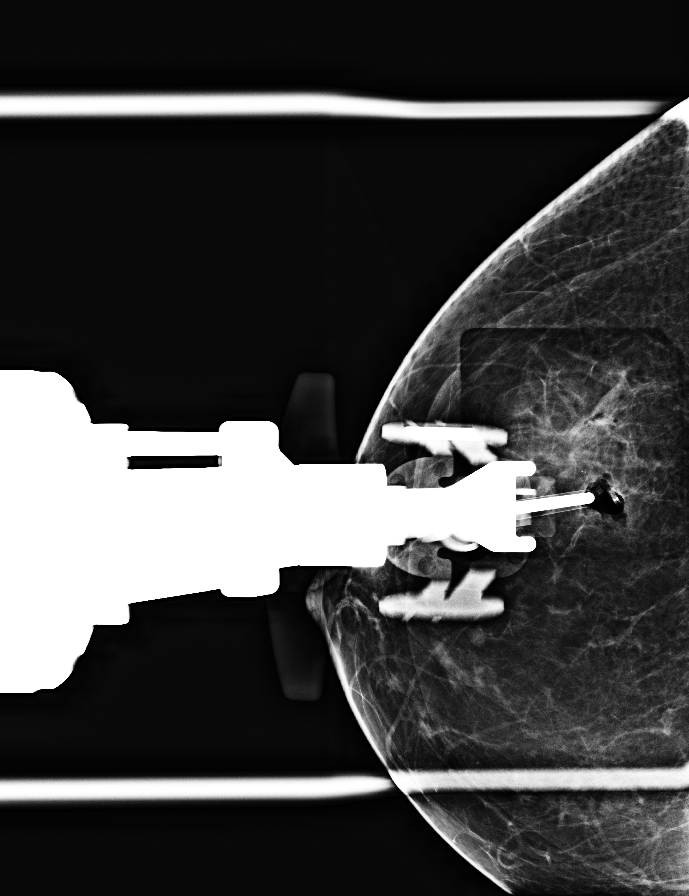

[R CC tomo · tomo slice 29/58.0]
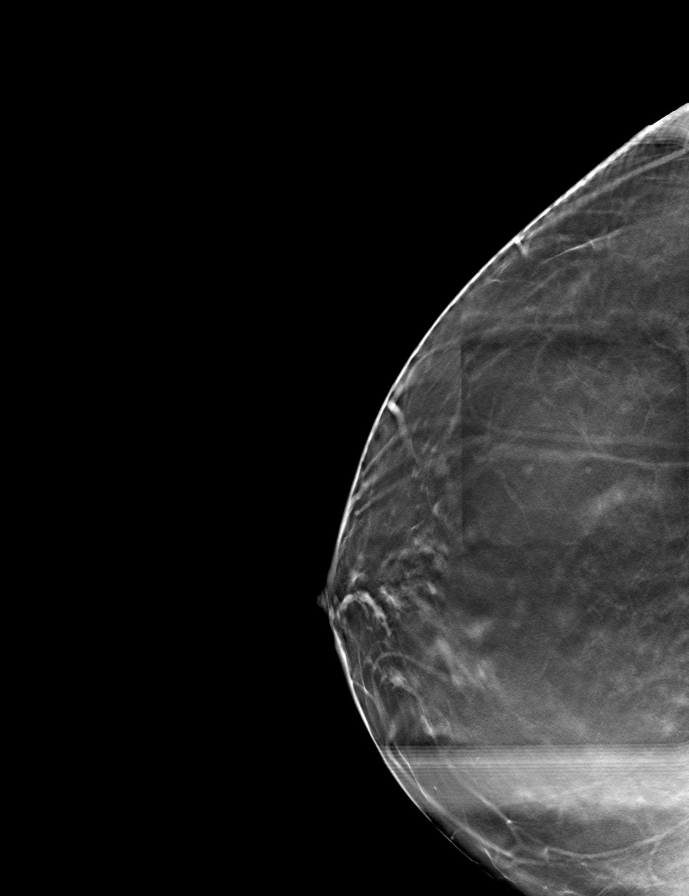

[8 of 17 positions shown; findings below may reference images not displayed]



Using sterile technique and 1% Lidocaine as local anesthetic, under
stereotactic guidance, a 9 gauge vacuum assisted device was used to
perform core needle biopsy of calcifications within the upper inner
left breast using a cranial approach. Specimen radiograph was
performed showing calcifications. Specimens with calcifications are
identified for pathology.

Lesion quadrant: Upper inner quadrant

At the conclusion of the procedure, X shaped tissue marker clip was
deployed into the biopsy cavity. Follow-up 2-view mammogram was
performed and dictated separately.
IMPRESSION: Stereotactic-guided biopsy of left breast calcifications. No
apparent complications.

ADDENDUM:
PATHOLOGY revealed: A. BREAST, LEFT, UPPER INNER;
STEREOTACTIC-GUIDED CORE BIOPSY: - CALCIFICATIONS ASSOCIATED WITH
SMALL CYSTS AND COLUMNAR CELL CHANGE. - NEGATIVE FOR ATYPIA AND
MALIGNANCY. Comment: Additional deeper sections were reviewed, and
there is acceptable correlation of the histologic calcifications
with the specimen radiograph. Correlation with all imaging findings
is recommended.

Pathology results are CONCORDANT with imaging findings, per Dr. Lill-Hege
Kimiyasu.

Pathology results were discussed with patient via telephone. The
patient reported doing well after the biopsy with no adverse
symptoms, only tenderness at the site. Post biopsy care instructions
were reviewed and questions were answered. The patient was
encouraged to call [HOSPITAL] for any additional
questions or concerns.

Recommendation: Patient instructed to continue monthly self breast
exams, clinical follow up as needed, and return to annual routine
bilateral screening mammogram.

Addendum by Kirsley Lei RN on 04/15/2019.



Using sterile technique and 1% Lidocaine as local anesthetic, under
stereotactic guidance, a 9 gauge vacuum assisted device was used to
perform core needle biopsy of calcifications within the upper inner
left breast using a cranial approach. Specimen radiograph was
performed showing calcifications. Specimens with calcifications are
identified for pathology.

Lesion quadrant: Upper inner quadrant

At the conclusion of the procedure, X shaped tissue marker clip was
deployed into the biopsy cavity. Follow-up 2-view mammogram was
performed and dictated separately.
IMPRESSION: Stereotactic-guided biopsy of left breast calcifications. No
apparent complications.

## 2021-05-20 ENCOUNTER — Other Ambulatory Visit: Payer: Self-pay | Admitting: Adult Health

## 2021-05-20 DIAGNOSIS — Z1231 Encounter for screening mammogram for malignant neoplasm of breast: Secondary | ICD-10-CM

## 2021-06-27 ENCOUNTER — Other Ambulatory Visit: Payer: Self-pay

## 2021-06-27 ENCOUNTER — Ambulatory Visit
Admission: RE | Admit: 2021-06-27 | Discharge: 2021-06-27 | Disposition: A | Discharge status: Home / Self Care | Payer: Medicare (Managed Care) | Source: Ambulatory Visit | Attending: Adult Health | Admitting: Adult Health

## 2021-06-27 DIAGNOSIS — Z1231 Encounter for screening mammogram for malignant neoplasm of breast: Secondary | ICD-10-CM | POA: Diagnosis not present

## 2021-07-04 ENCOUNTER — Other Ambulatory Visit: Payer: Self-pay | Admitting: Adult Health

## 2021-07-04 DIAGNOSIS — N6489 Other specified disorders of breast: Secondary | ICD-10-CM

## 2021-07-04 DIAGNOSIS — R928 Other abnormal and inconclusive findings on diagnostic imaging of breast: Secondary | ICD-10-CM

## 2021-07-19 ENCOUNTER — Other Ambulatory Visit: Payer: Self-pay

## 2021-07-19 ENCOUNTER — Ambulatory Visit
Admission: RE | Admit: 2021-07-19 | Discharge: 2021-07-19 | Disposition: A | Discharge status: Home / Self Care | Payer: Medicare (Managed Care) | Source: Ambulatory Visit | Attending: Adult Health | Admitting: Adult Health

## 2021-07-19 ENCOUNTER — Other Ambulatory Visit: Payer: Self-pay | Admitting: Adult Health

## 2021-07-19 DIAGNOSIS — N6489 Other specified disorders of breast: Secondary | ICD-10-CM

## 2021-07-19 DIAGNOSIS — R928 Other abnormal and inconclusive findings on diagnostic imaging of breast: Secondary | ICD-10-CM

## 2021-12-31 ENCOUNTER — Other Ambulatory Visit: Payer: Self-pay | Admitting: Family Medicine

## 2021-12-31 DIAGNOSIS — R928 Other abnormal and inconclusive findings on diagnostic imaging of breast: Secondary | ICD-10-CM

## 2021-12-31 DIAGNOSIS — Z1231 Encounter for screening mammogram for malignant neoplasm of breast: Secondary | ICD-10-CM

## 2022-01-24 ENCOUNTER — Ambulatory Visit
Admission: RE | Admit: 2022-01-24 | Discharge: 2022-01-24 | Disposition: A | Discharge status: Home / Self Care | Payer: Medicare (Managed Care) | Source: Ambulatory Visit | Attending: Family Medicine | Admitting: Family Medicine

## 2022-01-24 DIAGNOSIS — R928 Other abnormal and inconclusive findings on diagnostic imaging of breast: Secondary | ICD-10-CM

## 2022-07-14 ENCOUNTER — Other Ambulatory Visit: Payer: Self-pay | Admitting: Family Medicine

## 2022-07-14 ENCOUNTER — Encounter: Payer: Self-pay | Admitting: Family Medicine

## 2022-07-14 DIAGNOSIS — R928 Other abnormal and inconclusive findings on diagnostic imaging of breast: Secondary | ICD-10-CM

## 2022-08-21 ENCOUNTER — Ambulatory Visit
Admission: RE | Admit: 2022-08-21 | Discharge: 2022-08-21 | Disposition: A | Discharge status: Home / Self Care | Payer: Medicare (Managed Care) | Source: Ambulatory Visit | Attending: Family Medicine | Admitting: Family Medicine

## 2022-08-21 ENCOUNTER — Encounter: Payer: Self-pay | Admitting: Family Medicine

## 2022-08-21 DIAGNOSIS — R928 Other abnormal and inconclusive findings on diagnostic imaging of breast: Secondary | ICD-10-CM | POA: Diagnosis not present

## 2022-09-09 ENCOUNTER — Other Ambulatory Visit: Payer: Self-pay | Admitting: Family Medicine

## 2022-09-09 DIAGNOSIS — R928 Other abnormal and inconclusive findings on diagnostic imaging of breast: Secondary | ICD-10-CM

## 2023-03-10 NOTE — Discharge Instructions (Signed)

## 2023-03-16 ENCOUNTER — Encounter
Admission: RE | Admit: 2023-03-16 | Discharge: 2023-03-16 | Disposition: A | Discharge status: Home / Self Care | Payer: Medicare (Managed Care) | Source: Ambulatory Visit | Attending: Orthopedic Surgery | Admitting: Orthopedic Surgery

## 2023-03-16 VITALS — BP 105/44 | HR 71 | Resp 14 | Ht 66.0 in | Wt 220.9 lb

## 2023-03-16 DIAGNOSIS — Z01810 Encounter for preprocedural cardiovascular examination: Secondary | ICD-10-CM | POA: Diagnosis not present

## 2023-03-16 DIAGNOSIS — Z01818 Encounter for other preprocedural examination: Secondary | ICD-10-CM | POA: Insufficient documentation

## 2023-03-16 HISTORY — DX: Complete loss of teeth, unspecified cause, unspecified class: Z97.2

## 2023-03-16 HISTORY — DX: Complete loss of teeth, unspecified cause, unspecified class: K08.109

## 2023-03-16 HISTORY — DX: Unspecified right bundle-branch block: I45.10

## 2023-03-16 HISTORY — DX: Localized edema: R60.0

## 2023-03-16 HISTORY — DX: Tobacco use: Z72.0

## 2023-03-16 LAB — COMPREHENSIVE METABOLIC PANEL
ALT: 16 U/L (ref 0–44)
AST: 19 U/L (ref 15–41)
Albumin: 3.9 g/dL (ref 3.5–5.0)
Alkaline Phosphatase: 107 U/L (ref 38–126)
Anion gap: 10 (ref 5–15)
BUN: 15 mg/dL (ref 8–23)
CO2: 28 mmol/L (ref 22–32)
Calcium: 9.2 mg/dL (ref 8.9–10.3)
Chloride: 101 mmol/L (ref 98–111)
Creatinine, Ser: 0.53 mg/dL (ref 0.44–1.00)
GFR, Estimated: >60 mL/min (ref >60)
Glucose, Bld: 89 mg/dL (ref 70–99)
Potassium: 4 mmol/L (ref 3.5–5.1)
Sodium: 139 mmol/L (ref 135–145)
Total Bilirubin: 0.3 mg/dL (ref 0.3–1.2)
Total Protein: 7.2 g/dL (ref 6.5–8.1)

## 2023-03-16 LAB — CBC
HCT: 41.5 % (ref 36.0–46.0)
Hemoglobin: 13.6 g/dL (ref 12.0–15.0)
MCH: 29.4 pg (ref 26.0–34.0)
MCHC: 32.8 g/dL (ref 30.0–36.0)
MCV: 89.6 fL (ref 80.0–100.0)
Platelets: 188 10*3/uL (ref 150–400)
RBC: 4.63 MIL/uL (ref 3.87–5.11)
RDW: 13.4 % (ref 11.5–15.5)
WBC: 6.7 10*3/uL (ref 4.0–10.5)
nRBC: 0 % (ref 0.0–0.2)

## 2023-03-16 LAB — SURGICAL PCR SCREEN
MRSA, PCR: NEGATIVE
Staphylococcus aureus: NEGATIVE

## 2023-03-16 LAB — SEDIMENTATION RATE: Sed Rate: 19 mm/h (ref 0–30)

## 2023-03-16 LAB — C-REACTIVE PROTEIN: CRP: 0.6 mg/dL (ref <1.0)

## 2023-03-16 NOTE — Patient Instructions (Addendum)
Your procedure is scheduled on:03-23-23 Monday Report to the Registration Desk on the 1st floor of the [facility].Then proceed to the 2nd floor Surgery Desk To find out your arrival time, please call [phone] between 1PM - 3PM on:03-20-23 Friday If your arrival time is 6:00 am, do not arrive before that time as the [facility] entrance doors do not open until 6:00 am.  REMEMBER: Instructions that are not followed completely may result in serious medical risk, up to and including death; or upon the discretion of your surgeon and anesthesiologist your surgery may need to be rescheduled.  Do not eat food after midnight the night before surgery.  No gum chewing or hard candies.  You may however, drink CLEAR liquids up to 2 hours before you are scheduled to arrive for your surgery. Do not drink anything within 2 hours of your scheduled arrival time.  Clear liquids include: - water  - apple juice without pulp - gatorade (not RED colors) - black coffee or tea (Do NOT add milk or creamers to the coffee or tea) Do NOT drink anything that is not on this list.  In addition, your doctor has ordered for you to drink the provided:  Ensure Pre-Surgery Clear Carbohydrate Drink  Drinking this carbohydrate drink up to two hours before surgery helps to reduce insulin resistance and improve patient outcomes. Please complete drinking 2 hours before scheduled arrival time.  One week prior to surgery: Stop Anti-inflammatories (NSAIDS) such as Advil, Aleve, Ibuprofen, Motrin, Naproxen, Naprosyn and Aspirin based products such as Excedrin, Goody's Powder, BC Powder. You may continue your celecoxib (CELEBREX) up until the day prior to surgery Stop ANY OVER THE COUNTER supplements/vitamins NOW (03-16-23) until after surgery (Vitamin D3, Vitamin B12, Preservision AREDS 2)  You may however, continue to take Tylenol if needed for pain up until the day of surgery.  Stop your Semaglutide [facility]) 7 days  prior to surgery-Last dose was on 03-12-23-Do NOT take again until AFTER your surgery  Continue taking all of your other prescription medications up until the day of surgery.  ON THE DAY OF SURGERY ONLY TAKE THESE MEDICATIONS WITH SIPS OF WATER: -amLODipine (NORVASC)  -DULoxetine (CYMBALTA)  -nortriptyline (PAMELOR)  -pregabalin (LYRICA) - sertraline (ZOLOFT)   Use your Albuterol nebulizer  the day of surgery   No Alcohol for 24 hours before or after surgery.  No Smoking including e-cigarettes for 24 hours before surgery.  No chewable tobacco products for at least 6 hours before surgery.  No nicotine patches on the day of surgery.  Do not use any "recreational" drugs for at least a week (preferably 2 weeks) before your surgery.  Please be advised that the combination of cocaine and anesthesia may have negative outcomes, up to and including death. If you test positive for cocaine, your surgery will be cancelled.  On the morning of surgery brush your teeth with toothpaste and water, you may rinse your mouth with mouthwash if you wish. Do not swallow any toothpaste or mouthwash.  Use CHG Soap as directed on instruction sheet.  Do not wear jewelry, make-up, hairpins, clips or nail polish.  For welded (permanent) jewelry: bracelets, anklets, waist bands, etc.  Please have this removed prior to surgery.  If it is not removed, there is a chance that hospital personnel will need to cut it off on the day of surgery.  Do not wear lotions, powders, or perfumes.   Do not shave body hair from the neck down 48 hours  before surgery.  Contact lenses, hearing aids and dentures may not be worn into surgery.  Do not bring valuables to the hospital. [facility] is not responsible for any missing/lost belongings or valuables.  Notify your doctor if there is any change in your medical condition (cold, fever, infection).  Wear comfortable clothing (specific to your surgery type) to the  hospital.  After surgery, you can help prevent lung complications by doing breathing exercises.  Take deep breaths and cough every 1-2 hours. Your doctor may order a device called an Incentive Spirometer to help you take deep breaths. When coughing or sneezing, hold a pillow firmly against your incision with both hands. This is called "splinting." Doing this helps protect your incision. It also decreases belly discomfort.  If you are being admitted to the hospital overnight, leave your suitcase in the car. After surgery it may be brought to your room.  In case of increased patient census, it may be necessary for you, the patient, to continue your postoperative care in the Same Day Surgery department.  If you are being discharged the day of surgery, you will not be allowed to drive home. You will need a responsible individual to drive you home and stay with you for 24 hours after surgery.   If you are taking public transportation, you will need to have a responsible individual with you.  Please call the Pre-admissions Testing Dept. at [phone] if you have any questions about these instructions.  Surgery Visitation Policy:  Patients having surgery or a procedure may have two visitors.  Children under the age of 60 must have an adult with them who is not the patient.  Inpatient Visitation:    Visiting hours are 7 a.m. to 8 p.m. Up to four visitors are allowed at one time in a patient room. The visitors may rotate out with other people during the day.  One visitor age 43 or older may stay with the patient overnight and must be in the room by 8 p.m.    Pre-operative 5 CHG Bath Instructions   You can play a key role in reducing the risk of infection after surgery. Your skin needs to be as free of germs as possible. You can reduce the number of germs on your skin by washing with CHG (chlorhexidine gluconate) soap before surgery. CHG is an antiseptic soap that kills germs and continues  to kill germs even after washing.   DO NOT use if you have an allergy to chlorhexidine/CHG or antibacterial soaps. If your skin becomes reddened or irritated, stop using the CHG and notify one of our RNs at [phone].   Please shower with the CHG soap starting 4 days before surgery using the following schedule:     Please keep in mind the following:  DO NOT shave, including legs and underarms, starting the day of your first shower.   You may shave your face at any point before/day of surgery.  Place clean sheets on your bed the day you start using CHG soap. Use a clean washcloth (not used since being washed) for each shower. DO NOT sleep with pets once you start using the CHG.   CHG Shower Instructions:  If you choose to wash your hair and private area, wash first with your normal shampoo/soap.  After you use shampoo/soap, rinse your hair and body thoroughly to remove shampoo/soap residue.  Turn the water OFF and apply about 3 tablespoons (45 ml) of CHG soap to a CLEAN washcloth.  Apply CHG soap ONLY FROM YOUR NECK DOWN TO YOUR TOES (washing for 3-5 minutes)  DO NOT use CHG soap on face, private areas, open wounds, or sores.  Pay special attention to the area where your surgery is being performed.  If you are having back surgery, having someone wash your back for you may be helpful. Wait 2 minutes after CHG soap is applied, then you may rinse off the CHG soap.  Pat dry with a clean towel  Put on clean clothes/pajamas   If you choose to wear lotion, please use ONLY the CHG-compatible lotions on the back of this paper.     Additional instructions for the day of surgery: DO NOT APPLY any lotions, deodorants, cologne, or perfumes.   Put on clean/comfortable clothes.  Brush your teeth.  Ask your nurse before applying any prescription medications to the skin.      CHG Compatible Lotions   Aveeno Moisturizing lotion  Cetaphil Moisturizing Cream  Cetaphil Moisturizing Lotion   Clairol Herbal Essence Moisturizing Lotion, Dry Skin  Clairol Herbal Essence Moisturizing Lotion, Extra Dry Skin  Clairol Herbal Essence Moisturizing Lotion, Normal Skin  Curel Age Defying Therapeutic Moisturizing Lotion with Alpha Hydroxy  Curel Extreme Care Body Lotion  Curel Soothing Hands Moisturizing Hand Lotion  Curel Therapeutic Moisturizing Cream, Fragrance-Free  Curel Therapeutic Moisturizing Lotion, Fragrance-Free  Curel Therapeutic Moisturizing Lotion, Original Formula  Eucerin Daily Replenishing Lotion  Eucerin Dry Skin Therapy Plus Alpha Hydroxy Crme  Eucerin Dry Skin Therapy Plus Alpha Hydroxy Lotion  Eucerin Original Crme  Eucerin Original Lotion  Eucerin Plus Crme Eucerin Plus Lotion  Eucerin TriLipid Replenishing Lotion  Keri Anti-Bacterial Hand Lotion  Keri Deep Conditioning Original Lotion Dry Skin Formula Softly Scented  Keri Deep Conditioning Original Lotion, Fragrance Free Sensitive Skin Formula  Keri Lotion Fast Absorbing Fragrance Free Sensitive Skin Formula  Keri Lotion Fast Absorbing Softly Scented Dry Skin Formula  Keri Original Lotion  Keri Skin Renewal Lotion Keri Silky Smooth Lotion  Keri Silky Smooth Sensitive Skin Lotion  Nivea Body Creamy Conditioning Oil  Nivea Body Extra Enriched Lotion  Nivea Body Original Lotion  Nivea Body Sheer Moisturizing Lotion Nivea Crme  Nivea Skin Firming Lotion  NutraDerm 30 Skin Lotion  NutraDerm Skin Lotion  NutraDerm Therapeutic Skin Cream  NutraDerm Therapeutic Skin Lotion  ProShield Protective Hand Cream  Provon moisturizing lotion  How to Use an Incentive Spirometer An incentive spirometer is a tool that measures how well you are filling your lungs with each breath. Learning to take long, deep breaths using this tool can help you keep your lungs clear and active. This may help to reverse or lessen your chance of developing breathing (pulmonary) problems, especially infection. You may be asked to use a  spirometer: After a surgery. If you have a lung problem or a history of smoking. After a long period of time when you have been unable to move or be active. If the spirometer includes an indicator to show the highest number that you have reached, your health care provider or respiratory therapist will help you set a goal. Keep a log of your progress as told by your health care provider. What are the risks? Breathing too quickly may cause dizziness or cause you to pass out. Take your time so you do not get dizzy or light-headed. If you are in pain, you may need to take pain medicine before doing incentive spirometry. It is harder to take a deep breath if you are having  pain. How to use your incentive spirometer  Sit up on the edge of your bed or on a chair. Hold the incentive spirometer so that it is in an upright position. Before you use the spirometer, breathe out normally. Place the mouthpiece in your mouth. Make sure your lips are closed tightly around it. Breathe in slowly and as deeply as you can through your mouth, causing the piston or the ball to rise toward the top of the chamber. Hold your breath for 3-5 seconds, or for as long as possible. If the spirometer includes a coach indicator, use this to guide you in breathing. Slow down your breathing if the indicator goes above the marked areas. Remove the mouthpiece from your mouth and breathe out normally. The piston or ball will return to the bottom of the chamber. Rest for a few seconds, then repeat the steps 10 or more times. Take your time and take a few normal breaths between deep breaths so that you do not get dizzy or light-headed. Do this every 1-2 hours when you are awake. If the spirometer includes a goal marker to show the highest number you have reached (best effort), use this as a goal to work toward during each repetition. After each set of 10 deep breaths, cough a few times. This will help to make sure that your lungs are  clear. If you have an incision on your chest or abdomen from surgery, place a pillow or a rolled-up towel firmly against the incision when you cough. This can help to reduce pain while taking deep breaths and coughing. General tips When you are able to get out of bed: Walk around often. Continue to take deep breaths and cough in order to clear your lungs. Keep using the incentive spirometer until your health care provider says it is okay to stop using it. If you have been in the hospital, you may be told to keep using the spirometer at home. Contact a health care provider if: You are having difficulty using the spirometer. You have trouble using the spirometer as often as instructed. Your pain medicine is not giving enough relief for you to use the spirometer as told. You have a fever. Get help right away if: You develop shortness of breath. You develop a cough with bloody mucus from the lungs. You have fluid or blood coming from an incision site after you cough. Summary An incentive spirometer is a tool that can help you learn to take long, deep breaths to keep your lungs clear and active. You may be asked to use a spirometer after a surgery, if you have a lung problem or a history of smoking, or if you have been inactive for a long period of time. Use your incentive spirometer as instructed every 1-2 hours while you are awake. If you have an incision on your chest or abdomen, place a pillow or a rolled-up towel firmly against your incision when you cough. This will help to reduce pain. Get help right away if you have shortness of breath, you cough up bloody mucus, or blood comes from your incision when you cough. This information is not intended to replace advice given to you by your health care provider. Make sure you discuss any questions you have with your health care provider. Document Revised: 08/08/2019 Document Reviewed: 08/08/2019 Elsevier Patient Education  2024 [employer].

## 2023-03-18 LAB — URINALYSIS, ROUTINE W REFLEX MICROSCOPIC
Bilirubin Urine: NEGATIVE
Glucose, UA: NEGATIVE mg/dL
Hgb urine dipstick: NEGATIVE
Ketones, ur: NEGATIVE mg/dL
Leukocytes,Ua: NEGATIVE
Nitrite: NEGATIVE
Protein, ur: NEGATIVE mg/dL
Specific Gravity, Urine: 1.019 (ref 1.005–1.030)
pH: 5 (ref 5.0–8.0)

## 2023-03-20 NOTE — Pre-Procedure Instructions (Signed)
Faxed instructions over to [facility] along with informing them via fax that pt is having surgery here with [place] on 03-23-23

## 2023-03-22 ENCOUNTER — Encounter: Payer: Self-pay | Admitting: Orthopedic Surgery

## 2023-03-22 NOTE — H&P (Signed)
ORTHOPAEDIC HISTORY & PHYSICAL Latanya Maudlin, PA - 03/16/2023 9:15 AM EDT Formatting of this note is different from the original. Images from the original note were not included. Chief Complaint Chief Complaint Patient presents with Knee Pain H & P RIGHT KNEE  Reason for Visit Brittany Burke is a 77 y.o. who presents today for a history and physical. She is to undergo a right total knee arthroplasty on 03/23/2023. Since her last visit here to clinic there is been no improvement in her condition. The patient expresses her desire to proceed with surgery.  She reports a long history of progressive right knee pain. She localizes most of the pain along the lateral aspect of the knee. She reports significant swelling, no locking, and significant giving way of the knee. The pain is aggravated by any weight bearing. The knee pain limits the patient's ability to ambulate long distances.The patient has not appreciated any significant improvement despite Tylenol, NSAIDs, intra-articular corticosteroid injections, weight loss, and activity modification. She is using a walker for ambulation due to the right knee pain. The patient states that the knee pain has progressed to the point that it is significantly interfering with her activities of daily living.  Of note, the patient does report some low back pain with some pain extending down the lateral aspect of the right lower leg. The back pain is aggravated by sweeping or mopping. The right lower leg pain is "stabbing" in nature and is present with the patient standing, sitting, or supine. She denies any gross numbness or weakness. She denies any bowel or bladder dysfunction. She does have a history of previous lumbar surgery as per Dr. Delma Officer in [place].  Past Medical History Past Medical History: Diagnosis Date Anxiety Colon polyp COPD, moderate (CMS/HHS-HCC) Depression Gait abnormality Hyperlipemia Hypertension Insomnia Leg  pain Obesity Continue exercise and Diet Osteoarthritis Postherpetic neuralgia Prediabetes PVD (peripheral vascular disease) (CMS-HCC) Spinal stenosis Tobacco use Tremor Urge incontinence Vitamin deficiency  Past Surgical History Past Surgical History: Procedure Laterality Date HYSTERECTOMY 1974 L3 and L4 laminectomies, L2 laminotomies 08/23/2014 Dr. 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/28/2017 Dr Ernest Pine COLONOSCOPY 04/22/2019 Diverticulosis/PHx CP/Repeat 55yrs/JWB COLONOSCOPY 07/15/2011, 02/11/2006 Adenomatous Polyps: CBF 07/2014; Recall Ltr mailed 05/18/2014 (dw)  Past Family History Family History Problem Relation Age of Onset High blood pressure (Hypertension) Daughter 78 High blood pressure (Hypertension) Mother Diabetes Maternal Grandmother 40  Medications Current Outpatient Medications Medication Sig Dispense Refill acetaminophen (TYLENOL) 500 MG tablet Take 1,000 mg by mouth every 8 (eight) hours as needed amLODIPine (NORVASC) 2.5 MG tablet Take 2.5 mg by mouth once daily carboxymethylcellulose-glycerin (REFRESH OPTIVE) 0.5-0.9 % ophthalmic solution Place 1-2 drops into both eyes as needed for Dry Eyes celecoxib (CELEBREX) 100 MG capsule Take 100 mg by mouth once daily cetirizine (ZYRTEC) 10 MG tablet Take 10 mg by mouth once daily cholecalciferol (VITAMIN D3) 1000 unit tablet DULoxetine (CYMBALTA) 60 MG DR capsule Take 2 capsules (120 mg total) by mouth once daily. 180 capsule 1 lidocaine (LIDODERM) 5 % patch Place 1 patch onto the skin daily Apply patch to the most painful area for up to 12 hours in a 24 hour period. LYRICA 150 mg capsule Take 150 mg by mouth once daily MINERIN CREME cream Apply topically as needed for Dry Skin olopatadine (PATADAY) 0.2 % ophthalmic solution Place 1 drop into both eyes once daily OZEMPIC 1 mg/dose (4 mg/3 mL) pen injector Inject 1 mg  subcutaneously  once a week pravastatin (PRAVACHOL) 40 MG tablet Take 40 mg by mouth nightly pregabalin (LYRICA) 200 MG capsule Take 200 mg by mouth 2 (two) times daily PRESERVISION AREDS-2 soft gel capsule Take 1 capsule by mouth 2 (two) times daily with meals sertraline (ZOLOFT) 50 MG tablet traZODone (DESYREL) 100 MG tablet Take 2 tablets (200 mg total) by mouth nightly. 60 tablet 3 VITAMIN B-12 1000 MCG tablet Take 1,000 mcg by mouth once daily nortriptyline (PAMELOR) 25 MG capsule Take 25 mg by mouth 2 (two) times daily TORsemide (DEMADEX) 5 MG tablet Take 5 mg by mouth once daily (Patient not taking: Reported on 03/16/2023)  No current facility-administered medications for this visit.  Allergies No Known Allergies  Review of Systems A comprehensive 14 point ROS was performed, reviewed, and the pertinent orthopaedic findings are documented in the HPI.  Exam BP 120/70 (BP Location: Left upper arm, Patient Position: Sitting, BP Cuff Size: Large Adult)  Ht 157.5 cm (5\' 2" )  Wt 100.3 kg (221 lb 3.2 oz)  BMI 40.46 kg/m  General: Well-developed well-nourished female seen in no acute distress.  HEENT: Atraumatic,normocephalic. Pupils are equal and reactive to light. Oropharynx is clear with moist mucosa  Lungs: Clear to auscultation bilaterally  Cardiovascular: Regular rate and rhythm. Normal S1, S2. No murmurs. No appreciable gallops or rubs. Peripheral pulses are palpable.  Abdomen: Soft, non-tender, nondistended. Bowel sounds present  Extremity: Right Knee: Soft tissue swelling: mild Effusion: minimal Erythema: none Crepitance: mild Tenderness: lateral Alignment: relative valgus Mediolateral laxity: lateral pseudolaxity Posterior sag: negative Patellar tracking: Good tracking without evidence of subluxation or tilt Atrophy: No significant atrophy. Quadriceps tone was fair to good. Range of motion: 0/3/117 degrees  Neurological:  The patient is alert and  oriented Sensation to light touch appears to be intact and within normal limits Gross motor strength appeared to be equal to 5/5  Vascular :  Peripheral pulses felt to be palpable. Capillary refill appears to be intact and within normal limits  X-ray  1. AP standing, lateral and sunrise view of the right knee ordered and interpreted on 12/23/2022 showed bone-on-bone to the lateral compartment space with osteophytes and subchondral sclerosis been noted. Also noted was increased increased valgus alignment. No acute bony abnormalities was noted.  Impression  1. Degenerative arthrosis right knee  Plan  1. Patient's medication was gone over on today's visit 2. Past medical history reviewed 3. Patient is planning on going home following surgery 4. Return to clinic 2 weeks postop. Sooner if any problems  This note was generated in part with voice recognition software and I apologize for any typographical errors that were not detected and corrected   Tera Partridge PA Electronically signed by Latanya Maudlin, PA at 03/16/2023 9:25 AM EDT

## 2023-03-23 ENCOUNTER — Other Ambulatory Visit: Payer: Self-pay

## 2023-03-23 ENCOUNTER — Encounter: Payer: Self-pay | Admitting: Orthopedic Surgery

## 2023-03-23 ENCOUNTER — Encounter
Admission: RE | Disposition: A | Discharge status: Home / Self Care | Payer: Self-pay | Source: Ambulatory Visit | Attending: Orthopedic Surgery

## 2023-03-23 ENCOUNTER — Observation Stay
Admission: RE | Admit: 2023-03-23 | Discharge: 2023-03-24 | Disposition: A | Discharge status: Home / Self Care | Payer: Medicare (Managed Care) | Source: Ambulatory Visit | Attending: Orthopedic Surgery | Admitting: Orthopedic Surgery

## 2023-03-23 ENCOUNTER — Ambulatory Visit: Payer: Medicare (Managed Care) | Admitting: Urgent Care

## 2023-03-23 ENCOUNTER — Observation Stay: Payer: Medicare (Managed Care)

## 2023-03-23 DIAGNOSIS — I10 Essential (primary) hypertension: Secondary | ICD-10-CM | POA: Diagnosis not present

## 2023-03-23 DIAGNOSIS — J449 Chronic obstructive pulmonary disease, unspecified: Secondary | ICD-10-CM | POA: Insufficient documentation

## 2023-03-23 DIAGNOSIS — Z96641 Presence of right artificial hip joint: Secondary | ICD-10-CM | POA: Insufficient documentation

## 2023-03-23 DIAGNOSIS — M1711 Unilateral primary osteoarthritis, right knee: Principal | ICD-10-CM | POA: Insufficient documentation

## 2023-03-23 DIAGNOSIS — Z96659 Presence of unspecified artificial knee joint: Secondary | ICD-10-CM

## 2023-03-23 DIAGNOSIS — Z79899 Other long term (current) drug therapy: Secondary | ICD-10-CM | POA: Insufficient documentation

## 2023-03-23 DIAGNOSIS — I110 Hypertensive heart disease with heart failure: Secondary | ICD-10-CM | POA: Diagnosis not present

## 2023-03-23 DIAGNOSIS — Z01818 Encounter for other preprocedural examination: Secondary | ICD-10-CM

## 2023-03-23 HISTORY — PX: KNEE ARTHROPLASTY: SHX992

## 2023-03-23 SURGERY — ARTHROPLASTY, KNEE, TOTAL, USING IMAGELESS COMPUTER-ASSISTED NAVIGATION
Anesthesia: Spinal | Site: Knee | Laterality: Right

## 2023-03-23 MED ORDER — AMLODIPINE BESYLATE 5 MG PO TABS: 2.5000 mg | ORAL_TABLET | Freq: Every day | ORAL | Status: DC

## 2023-03-23 MED ORDER — FENTANYL CITRATE (PF) 100 MCG/2ML IJ SOLN
INTRAMUSCULAR | Status: DC | PRN
  Administered 2023-03-23: 50 ug via INTRAVENOUS

## 2023-03-23 MED ORDER — ACETAMINOPHEN 325 MG PO TABS: 325.0000 mg | ORAL_TABLET | Freq: Four times a day (QID) | ORAL | Status: DC | PRN

## 2023-03-23 MED ORDER — CELECOXIB 200 MG PO CAPS
ORAL_CAPSULE | ORAL | Status: AC
Start: 2023-03-23 — End: ?
  Filled 2023-03-23: qty 1

## 2023-03-23 MED ORDER — ONDANSETRON HCL 4 MG PO TABS: 4.0000 mg | ORAL_TABLET | Freq: Four times a day (QID) | ORAL | Status: DC | PRN

## 2023-03-23 MED ORDER — ASPIRIN 81 MG PO CHEW
81.0000 mg | CHEWABLE_TABLET | Freq: Two times a day (BID) | ORAL | Status: DC
  Administered 2023-03-23 – 2023-03-24 (×2): 81 mg via ORAL

## 2023-03-23 MED ORDER — CELECOXIB 200 MG PO CAPS
200.0000 mg | ORAL_CAPSULE | Freq: Two times a day (BID) | ORAL | Status: DC
  Administered 2023-03-23 – 2023-03-24 (×2): 200 mg via ORAL

## 2023-03-23 MED ORDER — FENTANYL CITRATE (PF) 100 MCG/2ML IJ SOLN
INTRAMUSCULAR | Status: AC
  Filled 2023-03-23: qty 2

## 2023-03-23 MED ORDER — TRANEXAMIC ACID-NACL 1000-0.7 MG/100ML-% IV SOLN
1000.0000 mg | Freq: Once | INTRAVENOUS | Status: AC
  Administered 2023-03-23: 1000 mg via INTRAVENOUS

## 2023-03-23 MED ORDER — CHLORHEXIDINE GLUCONATE 4 % EX SOLN: 60.0000 mL | Freq: Once | CUTANEOUS | Status: DC

## 2023-03-23 MED ORDER — SODIUM CHLORIDE 0.9 % IV SOLN
INTRAVENOUS | Status: DC | PRN
  Administered 2023-03-23: 60 mL

## 2023-03-23 MED ORDER — ENSURE PRE-SURGERY PO LIQD: 296.0000 mL | Freq: Once | ORAL | Status: DC

## 2023-03-23 MED ORDER — HYDROMORPHONE HCL 1 MG/ML IJ SOLN
0.5000 mg | INTRAMUSCULAR | Status: DC | PRN
Start: 2023-03-23 — End: 2023-03-24

## 2023-03-23 MED ORDER — CELECOXIB 200 MG PO CAPS
ORAL_CAPSULE | ORAL | Status: AC
  Filled 2023-03-23: qty 2

## 2023-03-23 MED ORDER — FLEET ENEMA RE ENEM: 1.0000 | ENEMA | Freq: Once | RECTAL | Status: DC | PRN

## 2023-03-23 MED ORDER — OXYCODONE HCL 5 MG PO TABS
ORAL_TABLET | ORAL | Status: AC
  Filled 2023-03-23: qty 1

## 2023-03-23 MED ORDER — PROPOFOL 1000 MG/100ML IV EMUL
INTRAVENOUS | Status: AC
  Filled 2023-03-23: qty 100

## 2023-03-23 MED ORDER — SUCCINYLCHOLINE CHLORIDE 200 MG/10ML IV SOSY
PREFILLED_SYRINGE | INTRAVENOUS | Status: DC | PRN
  Administered 2023-03-23: 140 mg via INTRAVENOUS

## 2023-03-23 MED ORDER — ACETAMINOPHEN 10 MG/ML IV SOLN
INTRAVENOUS | Status: DC | PRN
  Administered 2023-03-23: 1000 mg via INTRAVENOUS

## 2023-03-23 MED ORDER — CHLORHEXIDINE GLUCONATE 0.12 % MT SOLN
15.0000 mL | Freq: Once | OROMUCOSAL | Status: AC
  Administered 2023-03-23: 15 mL via OROMUCOSAL

## 2023-03-23 MED ORDER — CHLORHEXIDINE GLUCONATE 0.12 % MT SOLN
OROMUCOSAL | Status: AC
  Filled 2023-03-23: qty 15

## 2023-03-23 MED ORDER — GABAPENTIN 300 MG PO CAPS
300.0000 mg | ORAL_CAPSULE | Freq: Once | ORAL | Status: AC
  Administered 2023-03-23: 300 mg via ORAL

## 2023-03-23 MED ORDER — CELECOXIB 200 MG PO CAPS
400.0000 mg | ORAL_CAPSULE | Freq: Once | ORAL | Status: AC
  Administered 2023-03-23: 400 mg via ORAL

## 2023-03-23 MED ORDER — OXYCODONE HCL 5 MG PO TABS
5.0000 mg | ORAL_TABLET | ORAL | Status: DC | PRN
  Administered 2023-03-23 – 2023-03-24 (×3): 5 mg via ORAL

## 2023-03-23 MED ORDER — OXYCODONE HCL 5 MG PO TABS: 10.0000 mg | ORAL_TABLET | ORAL | Status: DC | PRN

## 2023-03-23 MED ORDER — DULOXETINE HCL 60 MG PO CPEP
120.0000 mg | ORAL_CAPSULE | Freq: Every day | ORAL | Status: DC
  Administered 2023-03-24: 120 mg via ORAL
  Filled 2023-03-23: qty 2

## 2023-03-23 MED ORDER — ALBUTEROL SULFATE (2.5 MG/3ML) 0.083% IN NEBU: 2.5000 mg | INHALATION_SOLUTION | RESPIRATORY_TRACT | Status: DC | PRN

## 2023-03-23 MED ORDER — CEFAZOLIN SODIUM-DEXTROSE 2-4 GM/100ML-% IV SOLN
INTRAVENOUS | Status: AC
  Filled 2023-03-23: qty 100

## 2023-03-23 MED ORDER — PRAVASTATIN SODIUM 40 MG PO TABS
40.0000 mg | ORAL_TABLET | Freq: Every day | ORAL | Status: DC
  Administered 2023-03-23: 40 mg via ORAL

## 2023-03-23 MED ORDER — ACETAMINOPHEN 10 MG/ML IV SOLN
INTRAVENOUS | Status: AC
  Filled 2023-03-23: qty 100

## 2023-03-23 MED ORDER — SODIUM CHLORIDE 0.9 % IR SOLN
Status: DC | PRN
  Administered 2023-03-23: 3000 mL

## 2023-03-23 MED ORDER — TRANEXAMIC ACID-NACL 1000-0.7 MG/100ML-% IV SOLN
INTRAVENOUS | Status: AC
  Filled 2023-03-23: qty 100

## 2023-03-23 MED ORDER — PRAVASTATIN SODIUM 40 MG PO TABS
ORAL_TABLET | ORAL | Status: AC
  Filled 2023-03-23: qty 1

## 2023-03-23 MED ORDER — MIDAZOLAM HCL 2 MG/2ML IJ SOLN
INTRAMUSCULAR | Status: DC | PRN
  Administered 2023-03-23 (×2): 1 mg via INTRAVENOUS

## 2023-03-23 MED ORDER — TRAZODONE HCL 100 MG PO TABS
200.0000 mg | ORAL_TABLET | Freq: Every day | ORAL | Status: DC
  Administered 2023-03-23: 200 mg via ORAL
  Filled 2023-03-23: qty 2

## 2023-03-23 MED ORDER — ORAL CARE MOUTH RINSE: 15.0000 mL | Freq: Once | OROMUCOSAL | Status: AC

## 2023-03-23 MED ORDER — TRANEXAMIC ACID-NACL 1000-0.7 MG/100ML-% IV SOLN
1000.0000 mg | INTRAVENOUS | Status: AC
  Administered 2023-03-23: 1000 mg via INTRAVENOUS

## 2023-03-23 MED ORDER — ASPIRIN 81 MG PO CHEW
CHEWABLE_TABLET | ORAL | Status: AC
  Filled 2023-03-23: qty 1

## 2023-03-23 MED ORDER — ENSURE PRE-SURGERY PO LIQD
296.0000 mL | Freq: Once | ORAL | Status: DC
  Filled 2023-03-23: qty 296

## 2023-03-23 MED ORDER — LIDOCAINE HCL (CARDIAC) PF 100 MG/5ML IV SOSY
PREFILLED_SYRINGE | INTRAVENOUS | Status: DC | PRN
  Administered 2023-03-23: 100 mg via INTRAVENOUS

## 2023-03-23 MED ORDER — PANTOPRAZOLE SODIUM 40 MG PO TBEC
40.0000 mg | DELAYED_RELEASE_TABLET | Freq: Two times a day (BID) | ORAL | Status: DC
  Administered 2023-03-23 – 2023-03-24 (×2): 40 mg via ORAL

## 2023-03-23 MED ORDER — CEFAZOLIN SODIUM-DEXTROSE 2-4 GM/100ML-% IV SOLN
2.0000 g | Freq: Four times a day (QID) | INTRAVENOUS | Status: AC
  Administered 2023-03-23 (×2): 2 g via INTRAVENOUS

## 2023-03-23 MED ORDER — METOCLOPRAMIDE HCL 10 MG PO TABS
ORAL_TABLET | ORAL | Status: AC
Start: 2023-03-23 — End: ?
  Filled 2023-03-23: qty 1

## 2023-03-23 MED ORDER — ONDANSETRON HCL 4 MG/2ML IJ SOLN
INTRAMUSCULAR | Status: DC | PRN
  Administered 2023-03-23: 4 mg via INTRAVENOUS

## 2023-03-23 MED ORDER — PROPOFOL 10 MG/ML IV BOLUS
INTRAVENOUS | Status: AC
  Filled 2023-03-23: qty 20

## 2023-03-23 MED ORDER — PROPOFOL 10 MG/ML IV BOLUS
INTRAVENOUS | Status: DC | PRN
  Administered 2023-03-23: 150 mg via INTRAVENOUS

## 2023-03-23 MED ORDER — BISACODYL 10 MG RE SUPP: 10.0000 mg | Freq: Every day | RECTAL | Status: DC | PRN

## 2023-03-23 MED ORDER — DEXAMETHASONE SODIUM PHOSPHATE 10 MG/ML IJ SOLN
INTRAMUSCULAR | Status: AC
  Filled 2023-03-23: qty 1

## 2023-03-23 MED ORDER — DIPHENHYDRAMINE HCL 12.5 MG/5ML PO ELIX: 12.5000 mg | ORAL_SOLUTION | ORAL | Status: DC | PRN

## 2023-03-23 MED ORDER — ALUM & MAG HYDROXIDE-SIMETH 200-200-20 MG/5ML PO SUSP: 30.0000 mL | ORAL | Status: DC | PRN

## 2023-03-23 MED ORDER — ROCURONIUM BROMIDE 100 MG/10ML IV SOLN
INTRAVENOUS | Status: DC | PRN
  Administered 2023-03-23: 10 mg via INTRAVENOUS
  Administered 2023-03-23: 40 mg via INTRAVENOUS
  Administered 2023-03-23: 10 mg via INTRAVENOUS

## 2023-03-23 MED ORDER — SENNOSIDES-DOCUSATE SODIUM 8.6-50 MG PO TABS
1.0000 | ORAL_TABLET | Freq: Two times a day (BID) | ORAL | Status: DC
  Administered 2023-03-23 – 2023-03-24 (×2): 1 via ORAL

## 2023-03-23 MED ORDER — MIDAZOLAM HCL 2 MG/2ML IJ SOLN
INTRAMUSCULAR | Status: AC
  Filled 2023-03-23: qty 2

## 2023-03-23 MED ORDER — MAGNESIUM HYDROXIDE 400 MG/5ML PO SUSP
ORAL | Status: AC
  Filled 2023-03-23: qty 30

## 2023-03-23 MED ORDER — METOCLOPRAMIDE HCL 10 MG PO TABS
10.0000 mg | ORAL_TABLET | Freq: Three times a day (TID) | ORAL | Status: DC
  Administered 2023-03-23 – 2023-03-24 (×4): 10 mg via ORAL

## 2023-03-23 MED ORDER — PHENYLEPHRINE 80 MCG/ML (10ML) SYRINGE FOR IV PUSH (FOR BLOOD PRESSURE SUPPORT)
PREFILLED_SYRINGE | INTRAVENOUS | Status: DC | PRN
  Administered 2023-03-23 (×6): 160 ug via INTRAVENOUS

## 2023-03-23 MED ORDER — PREGABALIN 50 MG PO CAPS
150.0000 mg | ORAL_CAPSULE | Freq: Two times a day (BID) | ORAL | Status: DC
  Administered 2023-03-23 – 2023-03-24 (×2): 150 mg via ORAL

## 2023-03-23 MED ORDER — SUGAMMADEX SODIUM 200 MG/2ML IV SOLN
INTRAVENOUS | Status: DC | PRN
  Administered 2023-03-23: 200 mg via INTRAVENOUS

## 2023-03-23 MED ORDER — MAGNESIUM HYDROXIDE 400 MG/5ML PO SUSP
30.0000 mL | Freq: Every day | ORAL | Status: DC
  Administered 2023-03-23 – 2023-03-24 (×2): 30 mL via ORAL
  Filled 2023-03-23: qty 30

## 2023-03-23 MED ORDER — ONDANSETRON HCL 4 MG/2ML IJ SOLN: 4.0000 mg | Freq: Four times a day (QID) | INTRAMUSCULAR | Status: DC | PRN

## 2023-03-23 MED ORDER — OLOPATADINE HCL 0.1 % OP SOLN
1.0000 [drp] | Freq: Two times a day (BID) | OPHTHALMIC | Status: DC
  Administered 2023-03-23 – 2023-03-24 (×2): 1 [drp] via OPHTHALMIC
  Filled 2023-03-23: qty 5

## 2023-03-23 MED ORDER — SERTRALINE HCL 50 MG PO TABS
50.0000 mg | ORAL_TABLET | Freq: Every day | ORAL | Status: DC
  Administered 2023-03-24: 50 mg via ORAL

## 2023-03-23 MED ORDER — GABAPENTIN 300 MG PO CAPS
ORAL_CAPSULE | ORAL | Status: AC
  Filled 2023-03-23: qty 1

## 2023-03-23 MED ORDER — ACETAMINOPHEN 10 MG/ML IV SOLN
1000.0000 mg | Freq: Four times a day (QID) | INTRAVENOUS | Status: DC
  Administered 2023-03-23 – 2023-03-24 (×2): 1000 mg via INTRAVENOUS

## 2023-03-23 MED ORDER — PREGABALIN 50 MG PO CAPS
ORAL_CAPSULE | ORAL | Status: AC
  Filled 2023-03-23: qty 3

## 2023-03-23 MED ORDER — PHENOL 1.4 % MT LIQD: 1.0000 | OROMUCOSAL | Status: DC | PRN

## 2023-03-23 MED ORDER — CEFAZOLIN SODIUM-DEXTROSE 2-4 GM/100ML-% IV SOLN
2.0000 g | INTRAVENOUS | Status: AC
  Administered 2023-03-23: 2 g via INTRAVENOUS

## 2023-03-23 MED ORDER — MENTHOL 3 MG MT LOZG: 1.0000 | LOZENGE | OROMUCOSAL | Status: DC | PRN

## 2023-03-23 MED ORDER — PANTOPRAZOLE SODIUM 40 MG PO TBEC
DELAYED_RELEASE_TABLET | ORAL | Status: AC
Start: 2023-03-23 — End: ?
  Filled 2023-03-23: qty 1

## 2023-03-23 MED ORDER — BUPIVACAINE HCL (PF) 0.25 % IJ SOLN
INTRAMUSCULAR | Status: DC | PRN
  Administered 2023-03-23: 60 mL

## 2023-03-23 MED ORDER — LACTATED RINGERS IV SOLN: INTRAVENOUS | Status: DC

## 2023-03-23 MED ORDER — SODIUM CHLORIDE 0.9 % IV SOLN: INTRAVENOUS | Status: DC

## 2023-03-23 MED ORDER — FENTANYL CITRATE (PF) 100 MCG/2ML IJ SOLN: 25.0000 ug | INTRAMUSCULAR | Status: DC | PRN

## 2023-03-23 MED ORDER — SENNOSIDES-DOCUSATE SODIUM 8.6-50 MG PO TABS
ORAL_TABLET | ORAL | Status: AC
Start: 2023-03-23 — End: ?
  Filled 2023-03-23: qty 1

## 2023-03-23 MED ORDER — FERROUS SULFATE 325 (65 FE) MG PO TABS
ORAL_TABLET | ORAL | Status: AC
  Filled 2023-03-23: qty 1

## 2023-03-23 MED ORDER — DEXAMETHASONE SODIUM PHOSPHATE 10 MG/ML IJ SOLN
8.0000 mg | Freq: Once | INTRAMUSCULAR | Status: AC
  Administered 2023-03-23: 8 mg via INTRAVENOUS

## 2023-03-23 MED ORDER — FERROUS SULFATE 325 (65 FE) MG PO TABS
325.0000 mg | ORAL_TABLET | Freq: Two times a day (BID) | ORAL | Status: DC
  Administered 2023-03-23 – 2023-03-24 (×2): 325 mg via ORAL

## 2023-03-23 MED ORDER — TRAMADOL HCL 50 MG PO TABS: 50.0000 mg | ORAL_TABLET | ORAL | Status: DC | PRN

## 2023-03-23 MED ORDER — NORTRIPTYLINE HCL 25 MG PO CAPS
25.0000 mg | ORAL_CAPSULE | Freq: Two times a day (BID) | ORAL | Status: DC
  Administered 2023-03-23 – 2023-03-24 (×2): 25 mg via ORAL
  Filled 2023-03-23 (×2): qty 1

## 2023-03-23 MED ORDER — PHENYLEPHRINE 80 MCG/ML (10ML) SYRINGE FOR IV PUSH (FOR BLOOD PRESSURE SUPPORT)
PREFILLED_SYRINGE | INTRAVENOUS | Status: AC
  Filled 2023-03-23: qty 10

## 2023-03-23 MED ORDER — SURGIRINSE WOUND IRRIGATION SYSTEM - OPTIME
TOPICAL | Status: DC | PRN
  Administered 2023-03-23: 450 mL

## 2023-03-23 SURGICAL SUPPLY — 76 items
ATTUNE PS FEM RT SZ 5 CEM KNEE (Femur) IMPLANT
ATTUNE PSRP INSR SZ5 6 KNEE (Insert) IMPLANT
BASE TIBIA ATTUNE KNEE SYS SZ6 (Knees) IMPLANT
BATTERY INSTRU NAVIGATION (MISCELLANEOUS) ×8 IMPLANT
BIT DRILL QUICK REL 1/8 2PK SL (BIT) ×2 IMPLANT
BLADE SAW 70X12.5 (BLADE) ×2 IMPLANT
BLADE SAW 90X13X1.19 OSCILLAT (BLADE) ×2 IMPLANT
BLADE SAW 90X25X1.19 OSCILLAT (BLADE) ×2 IMPLANT
BONE CEMENT GENTAMICIN (Cement) ×2 IMPLANT
BRUSH SCRUB EZ PLAIN DRY (MISCELLANEOUS) ×2 IMPLANT
BSPLAT TIB 6 CMNT ROT PLAT STR (Knees) ×1 IMPLANT
BTRY SRG DRVR LF (MISCELLANEOUS) ×4
CEMENT BONE GENTAMICIN 40 (Cement) IMPLANT
COOLER POLAR GLACIER W/PUMP (MISCELLANEOUS) ×2 IMPLANT
CUFF TOURN SGL QUICK 24 (TOURNIQUET CUFF)
CUFF TOURN SGL QUICK 30 (TOURNIQUET CUFF)
CUFF TRNQT CYL 24X4X16.5-23 (TOURNIQUET CUFF) IMPLANT
CUFF TRNQT CYL 30X4X21-28X (TOURNIQUET CUFF) IMPLANT
DRAPE INCISE IOBAN 66X45 STRL (DRAPES) IMPLANT
DRAPE SHEET LG 3/4 BI-LAMINATE (DRAPES) ×2 IMPLANT
DRSG AQUACEL AG ADV 3.5X14 (GAUZE/BANDAGES/DRESSINGS) ×2 IMPLANT
DRSG MEPILEX SACRM 8.7X9.8 (GAUZE/BANDAGES/DRESSINGS) ×2 IMPLANT
DRSG TEGADERM 4X4.75 (GAUZE/BANDAGES/DRESSINGS) ×2 IMPLANT
DRSG XEROFORM 1X8 (GAUZE/BANDAGES/DRESSINGS) IMPLANT
DURAPREP 26ML APPLICATOR (WOUND CARE) ×4 IMPLANT
ELECT CAUTERY BLADE 6.4 (BLADE) ×2 IMPLANT
ELECT REM PT RETURN 9FT ADLT (ELECTROSURGICAL) ×1
ELECTRODE REM PT RTRN 9FT ADLT (ELECTROSURGICAL) ×2 IMPLANT
EVACUATOR 1/8 PVC DRAIN (DRAIN) ×2 IMPLANT
EX-PIN ORTHOLOCK NAV 4X150 (PIN) ×4 IMPLANT
GAUZE XEROFORM 1X8 LF (GAUZE/BANDAGES/DRESSINGS) ×2 IMPLANT
GLOVE BIOGEL M STRL SZ7.5 (GLOVE) ×8 IMPLANT
GLOVE SRG 8 PF TXTR STRL LF DI (GLOVE) ×4 IMPLANT
GLOVE SURG UNDER POLY LF SZ8 (GLOVE) ×2
GOWN STRL REUS W/ TWL LRG LVL3 (GOWN DISPOSABLE) ×2 IMPLANT
GOWN STRL REUS W/ TWL XL LVL3 (GOWN DISPOSABLE) ×2 IMPLANT
GOWN STRL REUS W/TWL LRG LVL3 (GOWN DISPOSABLE) ×1
GOWN STRL REUS W/TWL XL LVL3 (GOWN DISPOSABLE) ×1
GOWN TOGA ZIPPER T7+ PEEL AWAY (MISCELLANEOUS) ×2 IMPLANT
HANDLE YANKAUER SUCT OPEN TIP (MISCELLANEOUS) ×2 IMPLANT
HOLDER FOLEY CATH W/STRAP (MISCELLANEOUS) ×2 IMPLANT
HOOD PEEL AWAY T7 (MISCELLANEOUS) ×2 IMPLANT
IV NS IRRIG 3000ML ARTHROMATIC (IV SOLUTION) ×2 IMPLANT
KIT TURNOVER KIT A (KITS) ×2 IMPLANT
KNIFE SCULPS 14X20 (INSTRUMENTS) ×2 IMPLANT
MANIFOLD NEPTUNE II (INSTRUMENTS) ×4 IMPLANT
NDL SPNL 20GX3.5 QUINCKE YW (NEEDLE) ×4 IMPLANT
NEEDLE SPNL 20GX3.5 QUINCKE YW (NEEDLE) ×2 IMPLANT
PACK TOTAL KNEE (MISCELLANEOUS) ×2 IMPLANT
PAD ABD DERMACEA PRESS 5X9 (GAUZE/BANDAGES/DRESSINGS) ×4 IMPLANT
PAD ARMBOARD 7.5X6 YLW CONV (MISCELLANEOUS) ×6 IMPLANT
PAD WRAPON POLAR KNEE (MISCELLANEOUS) ×2 IMPLANT
PATELLA MEDIAL ATTUN 35MM KNEE (Knees) IMPLANT
PENCIL SMOKE EVACUATOR COATED (MISCELLANEOUS) ×2 IMPLANT
PIN DRILL FIX HALF THREAD (BIT) ×4 IMPLANT
PIN FIXATION 1/8DIA X 3INL (PIN) ×2 IMPLANT
PULSAVAC PLUS IRRIG FAN TIP (DISPOSABLE) ×1
SOLUTION IRRIG SURGIPHOR (IV SOLUTION) ×2 IMPLANT
SPONGE DRAIN TRACH 4X4 STRL 2S (GAUZE/BANDAGES/DRESSINGS) ×2 IMPLANT
STAPLER SKIN PROX 35W (STAPLE) ×2 IMPLANT
STOCKINETTE IMPERV 14X48 (MISCELLANEOUS) ×2 IMPLANT
STRAP TIBIA SHORT (MISCELLANEOUS) ×2 IMPLANT
SUCTION TUBE FRAZIER 10FR DISP (SUCTIONS) ×2 IMPLANT
SUT VIC AB 0 CT1 36 (SUTURE) ×2 IMPLANT
SUT VIC AB 1 CT1 36 (SUTURE) ×4 IMPLANT
SUT VIC AB 2-0 CT2 27 (SUTURE) ×2 IMPLANT
SYR 30ML LL (SYRINGE) ×4 IMPLANT
TIBIA ATTUNE KNEE SYS BASE SZ6 (Knees) ×1 IMPLANT
TIP FAN IRRIG PULSAVAC PLUS (DISPOSABLE) ×2 IMPLANT
TOWEL OR 17X26 4PK STRL BLUE (TOWEL DISPOSABLE) IMPLANT
TOWER CARTRIDGE SMART MIX (DISPOSABLE) ×2 IMPLANT
TRAP FLUID SMOKE EVACUATOR (MISCELLANEOUS) ×2 IMPLANT
TRAY FOLEY MTR SLVR 16FR STAT (SET/KITS/TRAYS/PACK) ×2 IMPLANT
TUBING CONNECTING 10 (TUBING) ×4 IMPLANT
WATER STERILE IRR 1000ML POUR (IV SOLUTION) ×2 IMPLANT
WRAPON POLAR PAD KNEE (MISCELLANEOUS) ×1

## 2023-03-23 NOTE — Interval H&P Note (Signed)
History and Physical Interval Note:  03/23/2023 10:46 AM  Brittany Burke  has presented today for surgery, with the diagnosis of PRIMARY OSTEOARTHRITIS OF RIGHT KNEE..  The various methods of treatment have been discussed with the patient and family. After consideration of risks, benefits and other options for treatment, the patient has consented to  Procedure(s): COMPUTER ASSISTED TOTAL KNEE ARTHROPLASTY (Right) as a surgical intervention.  The patient's history has been reviewed, patient examined, no change in status, stable for surgery.  I have reviewed the patient's chart and labs.  Questions were answered to the patient's satisfaction.     Reshard Guillet P Arzell Mcgeehan

## 2023-03-23 NOTE — Op Note (Signed)
OPERATIVE NOTE  DATE OF SURGERY:  03/23/2023  PATIENT NAME:  Brittany Burke   DOB: [birth year]  MRN: [id number]  PRE-OPERATIVE DIAGNOSIS: Degenerative arthrosis of the right knee, primary  POST-OPERATIVE DIAGNOSIS:  Same  PROCEDURE:  Right total knee arthroplasty using computer-assisted navigation  SURGEON:  Jena Gauss. M.D.  ASSISTANT:  Gean Birchwood, PA-C (present and scrubbed throughout the case, critical for assistance with exposure, retraction, instrumentation, and closure)  ANESTHESIA: general  ESTIMATED BLOOD LOSS: 50 mL  FLUIDS REPLACED: 800 mL of crystalloid  TOURNIQUET TIME: 88 minutes  DRAINS: 2 medium Hemovac drains  SOFT TISSUE RELEASES: Anterior cruciate ligament, posterior cruciate ligament, deep medial collateral ligament, patellofemoral ligament, and posterolateral corner  IMPLANTS UTILIZED: DePuy Attune size 5 posterior stabilized femoral component (cemented), size 6 rotating platform tibial component (cemented), 35 mm medialized dome patella (cemented), and a 6 mm stabilized rotating platform polyethylene insert.  INDICATIONS FOR SURGERY: Brittany Burke is a 77 y.o. year old female with a long history of progressive knee pain. X-rays demonstrated severe degenerative changes in tricompartmental fashion. The patient had not seen any significant improvement despite conservative nonsurgical intervention. After discussion of the risks and benefits of surgical intervention, the patient expressed understanding of the risks benefits and agree with plans for total knee arthroplasty.   The risks, benefits, and alternatives were discussed at length including but not limited to the risks of infection, bleeding, nerve injury, stiffness, blood clots, the need for revision surgery, cardiopulmonary complications, among others, and they were willing to proceed.  PROCEDURE IN DETAIL: The patient was brought into the operating room and, after adequate general anesthesia was  achieved, a tourniquet was placed on the patient's upper thigh. The patient's knee and leg were cleaned and prepped with alcohol and DuraPrep and draped in the usual sterile fashion. A "timeout" was performed as per usual protocol. The lower extremity was exsanguinated using an Esmarch, and the tourniquet was inflated to 300 mmHg. An anterior longitudinal incision was made followed by a standard mid vastus approach. The deep fibers of the medial collateral ligament were elevated in a subperiosteal fashion off of the medial flare of the tibia so as to maintain a continuous soft tissue sleeve. The patella was subluxed laterally and the patellofemoral ligament was incised. Inspection of the knee demonstrated severe degenerative changes with full-thickness loss of articular cartilage. Osteophytes were debrided using a rongeur. Anterior and posterior cruciate ligaments were excised. Two 4.0 mm Schanz pins were inserted in the femur and into the tibia for attachment of the array of trackers used for computer-assisted navigation. Hip center was identified using a circumduction technique. Distal landmarks were mapped using the computer. The distal femur and proximal tibia were mapped using the computer. The distal femoral cutting guide was positioned using computer-assisted navigation so as to achieve a 5 distal valgus cut. The femur was sized and it was felt that a size 5 femoral component was appropriate. A size 5 femoral cutting guide was positioned and the anterior cut was performed and verified using the computer. This was followed by completion of the posterior and chamfer cuts. Femoral cutting guide for the central box was then positioned in the center box cut was performed.  Attention was then directed to the proximal tibia. Medial and lateral menisci were excised. The extramedullary tibial cutting guide was positioned using computer-assisted navigation so as to achieve a 0 varus-valgus alignment and 3  posterior slope. The cut was performed and verified using the computer.  The proximal tibia was sized and it was felt that a size 6 tibial tray was appropriate. Tibial and femoral trials were inserted followed by insertion of a 5 mm polyethylene insert. The knee was felt to be tight laterally.  The trial components were removed and the knee was brought into full extension and distracted using the Moreland retractors.  The posterolateral corner was carefully released using a combination of electrocautery and Metzenbaum scissors.  Trial components were reinserted followed by placement of a 6 mm polyethylene trial.  This allowed for excellent mediolateral soft tissue balancing both in flexion and in full extension. Finally, the patella was cut and prepared so as to accommodate a 35 mm medialized dome patella. A patella trial was placed and the knee was placed through a range of motion with excellent patellar tracking appreciated. The femoral trial was removed after debridement of posterior osteophytes. The central post-hole for the tibial component was reamed followed by insertion of a keel punch. Tibial trials were then removed. Cut surfaces of bone were irrigated with copious amounts of normal saline using pulsatile lavage and then suctioned dry. Polymethylmethacrylate cement with gentamicin was prepared in the usual fashion using a vacuum mixer. Cement was applied to the cut surface of the proximal tibia as well as along the undersurface of a size 6 rotating platform tibial component. Tibial component was positioned and impacted into place. Excess cement was removed using Personal assistant. Cement was then applied to the cut surfaces of the femur as well as along the posterior flanges of the size 5 femoral component. The femoral component was positioned and impacted into place. Excess cement was removed using Personal assistant. A 6 mm polyethylene trial was inserted and the knee was brought into full extension with  steady axial compression applied. Finally, cement was applied to the backside of a 35 mm medialized dome patella and the patellar component was positioned and patellar clamp applied. Excess cement was removed using Personal assistant. After adequate curing of the cement, the tourniquet was deflated after a total tourniquet time of 88 minutes. Hemostasis was achieved using electrocautery. The knee was irrigated with copious amounts of normal saline using pulsatile lavage followed by 450 ml of Surgiphor and then suctioned dry. 20 mL of 1.3% Exparel and 60 mL of 0.25% Marcaine in 40 mL of normal saline was injected along the posterior capsule, medial and lateral gutters, and along the arthrotomy site. A 6 mm stabilized rotating platform polyethylene insert was inserted and the knee was placed through a range of motion with excellent mediolateral soft tissue balancing appreciated and excellent patellar tracking noted. 2 medium drains were placed in the wound bed and brought out through separate stab incisions. The medial parapatellar portion of the incision was reapproximated using interrupted sutures of #1 Vicryl. Subcutaneous tissue was approximated in layers using first #0 Vicryl followed #2-0 Vicryl. The skin was approximated with skin staples. A sterile dressing was applied.  The patient tolerated the procedure well and was transported to the recovery room in stable condition.    Ryeleigh Santore P. Angie Fava., M.D.

## 2023-03-23 NOTE — Anesthesia Procedure Notes (Signed)
Procedure Name: Intubation Date/Time: 03/23/2023 11:41 AM  Performed by: Reece Agar, CRNAPre-anesthesia Checklist: Patient identified, Emergency Drugs available, Suction available and Patient being monitored Patient Re-evaluated:Patient Re-evaluated prior to induction Oxygen Delivery Method: Circle system utilized Preoxygenation: Pre-oxygenation with 100% oxygen Induction Type: IV induction Ventilation: Mask ventilation without difficulty Laryngoscope Size: McGraph and 3 Grade View: Grade I Tube type: Oral Tube size: 7.0 mm Number of attempts: 1 Airway Equipment and Method: Stylet Placement Confirmation: ETT inserted through vocal cords under direct vision, positive ETCO2 and breath sounds checked- equal and bilateral Secured at: 20 cm Tube secured with: Tape Dental Injury: Teeth and Oropharynx as per pre-operative assessment

## 2023-03-23 NOTE — Transfer of Care (Signed)
Immediate Anesthesia Transfer of Care Note  Patient: Brittany Burke  Procedure(s) Performed: COMPUTER ASSISTED TOTAL KNEE ARTHROPLASTY (Right: Knee)  Patient Location: PACU  Anesthesia Type:General  Level of Consciousness: drowsy  Airway & Oxygen Therapy: Patient Spontanous Breathing and Patient connected to face mask oxygen  Post-op Assessment: Report given to RN and Post -op Vital signs reviewed and stable  Post vital signs: Reviewed and stable  Last Vitals:  Vitals Value Taken Time  BP    Temp 36.4 C 03/23/23 1511  Pulse 80 03/23/23 1514  Resp 13 03/23/23 1514  SpO2 100 % 03/23/23 1514  Vitals shown include unfiled device data.  Last Pain:  Vitals:   03/23/23 0931  TempSrc: Oral  PainSc: 7          Complications: No notable events documented.

## 2023-03-23 NOTE — Anesthesia Procedure Notes (Signed)
Spinal  Patient location during procedure: OR Start time: 03/23/2023 11:18 AM End time: 03/23/2023 11:28 AM Reason for block: surgical anesthesia Staffing Performed: resident/CRNA  Anesthesiologist: Lenard Simmer, MD Resident/CRNA: Reece Agar, CRNA Performed by: Reece Agar, CRNA Authorized by: Lenard Simmer, MD   Preanesthetic Checklist Completed: patient identified, IV checked, site marked, risks and benefits discussed, surgical consent, monitors and equipment checked, pre-op evaluation and timeout performed Spinal Block Patient position: sitting Prep: DuraPrep Patient monitoring: heart rate, cardiac monitor, continuous pulse ox and blood pressure Approach: right paramedian Location: L3-4 Injection technique: single-shot Needle Needle type: Quincke  Needle gauge: 22 G Needle length: 9 cm Assessment Events: failed spinal

## 2023-03-23 NOTE — Anesthesia Preprocedure Evaluation (Signed)
Anesthesia Evaluation  Patient identified by MRN, date of birth, ID band Patient awake    Reviewed: Allergy & Precautions, H&P , NPO status , Patient's Chart, lab work & pertinent test results, reviewed documented beta blocker date and time   History of Anesthesia Complications Negative for: history of anesthetic complications  Airway Mallampati: III  TM Distance: >3 FB Neck ROM: full    Dental no notable dental hx. (+) Edentulous Upper, Edentulous Lower, Upper Dentures, Lower Dentures, Dental Advidsory Given   Pulmonary neg shortness of breath, neg sleep apnea, pneumonia, resolved, neg COPD, neg recent URI, Current Smoker and Patient abstained from smoking.   Pulmonary exam normal breath sounds clear to auscultation       Cardiovascular Exercise Tolerance: Good hypertension, On Medications (-) angina + Peripheral Vascular Disease and +CHF  (-) CAD, (-) Past MI, (-) Cardiac Stents and (-) CABG Normal cardiovascular exam(-) dysrhythmias (-) Valvular Problems/Murmurs Rhythm:regular Rate:Normal     Neuro/Psych  PSYCHIATRIC DISORDERS (Depression) Anxiety Depression    negative neurological ROS     GI/Hepatic negative GI ROS, Neg liver ROS,,,  Endo/Other  negative endocrine ROSneg diabetes    Renal/GU negative Renal ROS  negative genitourinary   Musculoskeletal   Abdominal   Peds  Hematology negative hematology ROS (+)   Anesthesia Other Findings Past Medical History:   Hypertension                                                 CHF (congestive heart failure) (HCC)                           Comment:? on office note2 /16 pcp   Pneumonia                                       12/15          Comment:hx   Depression                                                   Arthritis                                                    Reproductive/Obstetrics negative OB ROS                               Anesthesia Physical Anesthesia Plan  ASA: 3  Anesthesia Plan: Spinal   Post-op Pain Management:    Induction: Intravenous  PONV Risk Score and Plan: 1 and Propofol infusion and TIVA  Airway Management Planned: Natural Airway and Simple Face Mask  Additional Equipment:   Intra-op Plan:   Post-operative Plan:   Informed Consent: I have reviewed the patients History and Physical, chart, labs and discussed the procedure including the risks, benefits and alternatives for the proposed anesthesia with the patient or authorized representative who has indicated his/her  understanding and acceptance.     Dental Advisory Given  Plan Discussed with: Anesthesiologist, CRNA and Surgeon  Anesthesia Plan Comments:          Anesthesia Quick Evaluation

## 2023-03-24 ENCOUNTER — Encounter: Payer: Self-pay | Admitting: Orthopedic Surgery

## 2023-03-24 DIAGNOSIS — M1711 Unilateral primary osteoarthritis, right knee: Secondary | ICD-10-CM | POA: Diagnosis not present

## 2023-03-24 MED ORDER — METOCLOPRAMIDE HCL 10 MG PO TABS
ORAL_TABLET | ORAL | Status: AC
Start: 2023-03-24 — End: ?
  Filled 2023-03-24: qty 1

## 2023-03-24 MED ORDER — SERTRALINE HCL 50 MG PO TABS
ORAL_TABLET | ORAL | Status: AC
Start: 2023-03-24 — End: ?
  Filled 2023-03-24: qty 1

## 2023-03-24 MED ORDER — ASPIRIN 81 MG PO CHEW
CHEWABLE_TABLET | ORAL | Status: AC
  Filled 2023-03-24: qty 1

## 2023-03-24 MED ORDER — CELECOXIB 200 MG PO CAPS: 200.0000 mg | ORAL_CAPSULE | Freq: Two times a day (BID) | ORAL | 1 refills | Status: DC

## 2023-03-24 MED ORDER — ASPIRIN 81 MG PO CHEW: 81.0000 mg | CHEWABLE_TABLET | Freq: Two times a day (BID) | ORAL | Status: AC

## 2023-03-24 MED ORDER — OXYCODONE HCL 5 MG PO TABS
ORAL_TABLET | ORAL | Status: AC
  Filled 2023-03-24: qty 1

## 2023-03-24 MED ORDER — OXYCODONE HCL 5 MG PO TABS: 5.0000 mg | ORAL_TABLET | ORAL | 0 refills | Status: DC | PRN

## 2023-03-24 MED ORDER — ACETAMINOPHEN 10 MG/ML IV SOLN
INTRAVENOUS | Status: AC
  Filled 2023-03-24: qty 100

## 2023-03-24 MED ORDER — SENNOSIDES-DOCUSATE SODIUM 8.6-50 MG PO TABS
ORAL_TABLET | ORAL | Status: AC
Start: 2023-03-24 — End: ?
  Filled 2023-03-24: qty 1

## 2023-03-24 MED ORDER — AMLODIPINE BESYLATE 5 MG PO TABS
ORAL_TABLET | ORAL | Status: AC
  Filled 2023-03-24: qty 1

## 2023-03-24 MED ORDER — TRAMADOL HCL 50 MG PO TABS: 50.0000 mg | ORAL_TABLET | ORAL | 0 refills | Status: DC | PRN

## 2023-03-24 MED ORDER — PREGABALIN 50 MG PO CAPS
ORAL_CAPSULE | ORAL | Status: AC
Start: 2023-03-24 — End: ?
  Filled 2023-03-24: qty 3

## 2023-03-24 MED ORDER — PANTOPRAZOLE SODIUM 40 MG PO TBEC
DELAYED_RELEASE_TABLET | ORAL | Status: AC
  Filled 2023-03-24: qty 1

## 2023-03-24 MED ORDER — CELECOXIB 200 MG PO CAPS
ORAL_CAPSULE | ORAL | Status: AC
Start: 2023-03-24 — End: ?
  Filled 2023-03-24: qty 1

## 2023-03-24 MED ORDER — METOCLOPRAMIDE HCL 10 MG PO TABS
ORAL_TABLET | ORAL | Status: AC
  Filled 2023-03-24: qty 1

## 2023-03-24 MED ORDER — FERROUS SULFATE 325 (65 FE) MG PO TABS
ORAL_TABLET | ORAL | Status: AC
  Filled 2023-03-24: qty 1

## 2023-03-24 NOTE — Evaluation (Signed)
Physical Therapy Evaluation Patient Details Name: Brittany Burke MRN: [id number] DOB: [birth year] Today's Date: 03/24/2023  History of Present Illness  Patient is a 77 y.o. female who reports She reports a long history of progressive right knee pain. PMH includes: anxiety, COPD, HLD, HTN, PVD, osteoarthritis. She is now s/p R TKA.  Clinical Impression  Pt was pleasant and motivated to participate during the session and put forth good effort throughout. Pt is currently supervision for bed mobility, and CGA for transfers and ambulation. Pt educated on RLE management during transfers. Once standing, pt cued for step-to pattern, showing good sequencing, but needing cue to address NBOS and general posture. Pt was only able to perform 50 foot amb bout with RW, limited secondary to fatigue. SpO2 dipped to a low of 88% during session but HR remained WFL. She was additionally able to perform 4 steps with a step-to pattern having BUE's support on both hand rails, showing good eccentric and concentric control, noting no imbalance. Pt having slight SOB after performing stairs but per pt report the SOB is normal after she does stairs. Pt will benefit from continued PT services upon discharge to safely address deficits listed in patient problem list for decreased caregiver assistance and eventual return to PLOF.          If plan is discharge home, recommend the following: A little help with walking and/or transfers;A little help with bathing/dressing/bathroom;Assist for transportation;Help with stairs or ramp for entrance;Assistance with cooking/housework   Can travel by private Tax inspector (2 wheels)  Recommendations for Other Services       Functional Status Assessment Patient has had a recent decline in their functional status and demonstrates the ability to make significant improvements in function in a reasonable and predictable amount of time.      Precautions / Restrictions Precautions Precautions: Fall;Knee Precaution Booklet Issued: Yes (comment) Restrictions Weight Bearing Restrictions: Yes RLE Weight Bearing: Weight bearing as tolerated      Mobility  Bed Mobility Overal bed mobility: Needs Assistance Bed Mobility: Supine to Sit     Supine to sit: Supervision     General bed mobility comments: needing light cues for general sequening    Transfers Overall transfer level: Needs assistance Equipment used: Rolling walker (2 wheels) Transfers: Sit to/from Stand Sit to Stand: Contact guard assist           General transfer comment: cues for hand and RLE placement during ascent/descent for STS's    Ambulation/Gait Ambulation/Gait assistance: Contact guard assist Gait Distance (Feet): 50 Feet   Gait Pattern/deviations: Step-to pattern, Trunk flexed, Narrow base of support, Antalgic, Decreased stance time - right, Decreased step length - left, Decreased step length - right, Decreased stride length, Decreased weight shift to right Gait velocity: decreased     General Gait Details: pt only able to perform short amb bout with step tp pattern and very NBOS, intermittent cues provided for improved sequencing and posture. overall antalgic; bout was very fatiguing for pt  Stairs Stairs: Yes Stairs assistance: Contact guard assist Stair Management: Two rails, Step to pattern Number of Stairs: 4 General stair comments: good eccentric/concentric control, very fatiguing for pt to perform, she reports feeling slightly SOB, but reports that as being normal when she performs stairs.  Wheelchair Mobility     Tilt Bed    Modified Rankin (Stroke Patients Only)       Balance Overall balance assessment: Needs assistance  Sitting-balance support: Feet supported Sitting balance-Leahy Scale: Good     Standing balance support: Reliant on assistive device for balance, During functional activity Standing balance-Leahy  Scale: Fair Standing balance comment: static standing with RW                             Pertinent Vitals/Pain Pain Assessment Pain Assessment: 0-10 Pain Score: 5  Pain Location: R knee Pain Descriptors / Indicators: Sore, Aching, Guarding Pain Intervention(s): Monitored during session, Premedicated before session, Limited activity within patient's tolerance    Home Living Family/patient expects to be discharged to:: Private residence Living Arrangements: Children (daughter staying with pt) Available Help at Discharge: Family;Available 24 hours/day Type of Home: Apartment Home Access: Level entry       Home Layout: One level Home Equipment: BSC/3in1;Rollator (4 wheels);Cane - single point;Shower seat;Grab bars - tub/shower      Prior Function Prior Level of Function : Independent/Modified Independent;History of Falls (last six months)             Mobility Comments: Limited community amb with Rollator at baseline, pt endorses 1 fall where she tried to support herself on rollator and it was not locked. ADLs Comments: Mod I with showering and bathroom use     Extremity/Trunk Assessment        Lower Extremity Assessment Lower Extremity Assessment: Generalized weakness;RLE deficits/detail RLE Deficits / Details: Post op R TKA, strength, ROM, and sensation all grossly WFL. Able perform R SLR       Communication   Communication Communication: No apparent difficulties  Cognition Arousal: Alert Behavior During Therapy: WFL for tasks assessed/performed Overall Cognitive Status: Within Functional Limits for tasks assessed                                          General Comments      Exercises Total Joint Exercises Ankle Circles/Pumps: 10 reps, Both, AROM Quad Sets: Right, 10 reps, AROM Short Arc Quad: AROM, Right, 10 reps Heel Slides: AROM, 5 reps, Right Hip ABduction/ADduction: Right, 5 reps, AROM Straight Leg Raises: AROM, 5  reps, Right Long Arc Quad: 10 reps, AROM, Right Knee Flexion: 10 reps, AROM, Right Goniometric ROM: R knee AROM: 3-92 deg Marching in Standing: 5 reps, Both, AROM, Standing Other Exercises Other Exercises: pt educated on proper stairs navigation technicues Other Exercises: Pt educated on, and demonstrated HEP packet exercises   Assessment/Plan    PT Assessment Patient needs continued PT services  PT Problem List Decreased strength;Decreased coordination;Decreased range of motion;Decreased activity tolerance;Decreased balance;Decreased mobility       PT Treatment Interventions DME instruction;Balance training;Gait training;Stair training;Functional mobility training;Therapeutic activities;Therapeutic exercise    PT Goals (Current goals can be found in the Care Plan section)  Acute Rehab PT Goals Patient Stated Goal: walk wothout pain PT Goal Formulation: With patient Time For Goal Achievement: 04/06/23 Potential to Achieve Goals: Good    Frequency BID     Co-evaluation               AM-PAC PT "6 Clicks" Mobility  Outcome Measure Help needed turning from your back to your side while in a flat bed without using bedrails?: A Little Help needed moving from lying on your back to sitting on the side of a flat bed without using bedrails?: A Little Help needed moving to  and from a bed to a chair (including a wheelchair)?: A Little Help needed standing up from a chair using your arms (e.g., wheelchair or bedside chair)?: A Little Help needed to walk in hospital room?: A Little Help needed climbing 3-5 steps with a railing? : A Little 6 Click Score: 18    End of Session Equipment Utilized During Treatment: Gait belt Activity Tolerance: Patient tolerated treatment well;Patient limited by fatigue Patient left: in chair;with call bell/phone within reach Nurse Communication: Mobility status PT Visit Diagnosis: Unsteadiness on feet (R26.81);Other abnormalities of gait and mobility  (R26.89);Muscle weakness (generalized) (M62.81);Difficulty in walking, not elsewhere classified (R26.2)    Time: 4098-1191 PT Time Calculation (min) (ACUTE ONLY): 52 min   Charges:                 Cecile Sheerer, SPT 03/24/23, 12:59 PM

## 2023-03-24 NOTE — Plan of Care (Signed)
  Problem: Activity: Goal: Ability to avoid complications of mobility impairment will improve Outcome: Progressing   Problem: Pain Management: Goal: Pain level will decrease with appropriate interventions Outcome: Progressing   

## 2023-03-24 NOTE — Plan of Care (Signed)
  Problem: Pain Management: Goal: Pain level will decrease with appropriate interventions Outcome: Progressing   

## 2023-03-24 NOTE — Progress Notes (Signed)
Physical Therapy Treatment Patient Details Name: Brittany Burke MRN: [id number] DOB: 1946/04/23 Today's Date: 03/24/2023   History of Present Illness Patient is a 77 y.o. female who reports She reports a long history of progressive right knee pain. PMH includes: anxiety, COPD, HLD, HTN, PVD, osteoarthritis. She is now s/p R TKA.    PT Comments  Pt was pleasant and motivated to participate during the session and put forth good effort throughout. Pt found seated in chair at start/end of session. Pt able to perform 2x 100 foot bouts of amb with RW. Continued cues for improved step width and overall posture.Pt quickly progressed to a step through pattern, ultimately still antalgic but a  good progression from prior session. Pt SpO2 and HR remained WFL throughout session, no imbalance or dizziness noted during session. Pt will benefit from continued PT services upon discharge to safely address deficits listed in patient problem list for decreased caregiver assistance and eventual return to PLOF.    If plan is discharge home, recommend the following: A little help with walking and/or transfers;A little help with bathing/dressing/bathroom;Assist for transportation;Help with stairs or ramp for entrance;Assistance with cooking/housework   Can travel by Training and development officer (2 wheels)    Recommendations for Other Services       Precautions / Restrictions Precautions Precautions: Fall;Knee Precaution Booklet Issued: Yes (comment) Restrictions Weight Bearing Restrictions: Yes RLE Weight Bearing: Weight bearing as tolerated     Mobility  Bed Mobility     General bed mobility comments: pt in chair at start/end of session    Transfers Overall transfer level: Needs assistance Equipment used: Rolling walker (2 wheels) Transfers: Sit to/from Stand Sit to Stand: Contact guard assist           General transfer comment: continued VC's for hand  placement    Ambulation/Gait Ambulation/Gait assistance: Contact guard assist Gait Distance (Feet): 100 Feet x2 Assistive device: Rolling walker (2 wheels) Gait Pattern/deviations: Narrow base of support, Antalgic, Decreased stance time - right, Decreased step length - left, Decreased step length - right, Decreased stride length, Decreased weight shift to right, Step-through pattern Gait velocity: decreased     General Gait Details: Pt improving in overall gait quality and ambulatory endurance, quickly progressed to a Step through giat pattern although continues to be generally antalgic   Stairs    Wheelchair Mobility     Tilt Bed    Modified Rankin (Stroke Patients Only)       Balance Overall balance assessment: Needs assistance Sitting-balance support: Feet supported Sitting balance-Leahy Scale: Good     Standing balance support: Reliant on assistive device for balance, During functional activity Standing balance-Leahy Scale: Fair Standing balance comment: static standing with RW                            Cognition Arousal: Alert Behavior During Therapy: WFL for tasks assessed/performed Overall Cognitive Status: Within Functional Limits for tasks assessed                                          Exercises  Other Exercises: continued education on step through gait pattern, cues and education for decreased weight bearing through UE's     General Comments General comments (skin integrity, edema, etc.): BP 113/57 (MAP 73),  spo2 92% on RA, HR 72 bpm after mobility      Pertinent Vitals/Pain Pain Assessment Pain Assessment: 0-10 Pain Score: 3  Pain Location: R knee Pain Descriptors / Indicators: Sore, Aching, Guarding Pain Intervention(s): Monitored during session, Limited activity within patient's tolerance    Home Living Family/patient expects to be discharged to:: Private residence Living Arrangements: Children (daugther  staying with pt initially) Available Help at Discharge: Family Type of Home: Apartment Home Access: Level entry       Home Layout: One level Home Equipment: BSC/3in1;Rollator (4 wheels);Cane - single point;Shower seat;Grab bars - tub/shower;Adaptive equipment      Prior Function            PT Goals (current goals can now be found in the care plan section) Acute Rehab PT Goals Patient Stated Goal: walk wothout pain PT Goal Formulation: With patient Time For Goal Achievement: 04/06/23 Potential to Achieve Goals: Good Progress towards PT goals: Progressing toward goals    Frequency    BID      PT Plan      Co-evaluation              AM-PAC PT "6 Clicks" Mobility   Outcome Measure  Help needed turning from your back to your side while in a flat bed without using bedrails?: A Little Help needed moving from lying on your back to sitting on the side of a flat bed without using bedrails?: A Little Help needed moving to and from a bed to a chair (including a wheelchair)?: A Little Help needed standing up from a chair using your arms (e.g., wheelchair or bedside chair)?: A Little Help needed to walk in hospital room?: A Little Help needed climbing 3-5 steps with a railing? : A Little 6 Click Score: 18    End of Session Equipment Utilized During Treatment: Gait belt Activity Tolerance: Patient tolerated treatment well Patient left: in chair;with call bell/phone within reach;with nursing/sitter in room Nurse Communication: Mobility status PT Visit Diagnosis: Unsteadiness on feet (R26.81);Other abnormalities of gait and mobility (R26.89);Muscle weakness (generalized) (M62.81);Difficulty in walking, not elsewhere classified (R26.2)     Time: 9604-5409 PT Time Calculation (min) (ACUTE ONLY): 19 min  Charges:                         Cecile Sheerer, SPT 03/24/23, 3:47 PM

## 2023-03-24 NOTE — Evaluation (Signed)
Occupational Therapy Evaluation Patient Details Name: Brittany Burke MRN: [id number] DOB: 12-10-1945 Today's Date: 03/24/2023   History of Present Illness Patient is a 77 y.o. female who reports She reports a long history of progressive right knee pain. PMH includes: anxiety, COPD, HLD, HTN, PVD, osteoarthritis. She is now s/p R TKA.   Clinical Impression   Chart reviewed to date, pt greeted in room, alert and oriented x4, agreeable to OT evaluation. PTA pt reports she is generally MOD I with ADL, has assist as needed for ADL/IADLs, attends PACE program.  Pt currently requires moderate assist for LB dressing while in seated position due to limited AROM of  R knee. Pt provided education via demo and hand out re: polar care mgt, falls prevention strategies, home/routines modifications, DME/AE for LB bathing and dressing tasks, and compression stocking mgt. Pt would benefit from skilled OT services including additional instruction in dressing techniques with or without assistive devices for dressing and bathing skills to support recall and carryover prior to discharge and ultimately to maximize safety, independence, and minimize falls risk and caregiver burden. OT will follow acutely.          If plan is discharge home, recommend the following: A little help with walking and/or transfers;A little help with bathing/dressing/bathroom;Direct supervision/assist for medications management;Assist for transportation;Help with stairs or ramp for entrance;Assistance with cooking/housework    Functional Status Assessment  Patient has had a recent decline in their functional status and demonstrates the ability to make significant improvements in function in a reasonable and predictable amount of time.  Equipment Recommendations  None recommended by OT (pt has recommended equipment)    Recommendations for Other Services       Precautions / Restrictions Precautions Precautions: Fall;Knee Precaution  Booklet Issued: Yes (comment) Restrictions Weight Bearing Restrictions: Yes RLE Weight Bearing: Weight bearing as tolerated      Mobility Bed Mobility               General bed mobility comments: NT in recliner pre/post session    Transfers Overall transfer level: Needs assistance Equipment used: Rolling walker (2 wheels) Transfers: Sit to/from Stand Sit to Stand: Contact guard assist                  Balance Overall balance assessment: Needs assistance Sitting-balance support: Feet supported Sitting balance-Leahy Scale: Good     Standing balance support: Reliant on assistive device for balance, During functional activity Standing balance-Leahy Scale: Fair                             ADL either performed or assessed with clinical judgement   ADL Overall ADL's : Needs assistance/impaired Eating/Feeding: Set up;Sitting   Grooming: Set up;Sitting           Upper Body Dressing : Supervision/safety;Sitting   Lower Body Dressing: Moderate assistance Lower Body Dressing Details (indicate cue type and reason): for underwear, pants; discussed use of AE for increased indep Toilet Transfer: Contact guard assist;Rolling walker (2 wheels) Toilet Transfer Details (indicate cue type and reason): simulated Toileting- Clothing Manipulation and Hygiene: Moderate assistance               Vision Patient Visual Report: No change from baseline       Perception         Praxis         Pertinent Vitals/Pain Pain Assessment Pain Assessment: 0-10 Pain Score: 3  Pain  Location: R knee Pain Descriptors / Indicators: Sore, Aching, Guarding Pain Intervention(s): Limited activity within patient's tolerance, Monitored during session, Ice applied     Extremity/Trunk Assessment Upper Extremity Assessment Upper Extremity Assessment: Generalized weakness   Lower Extremity Assessment Lower Extremity Assessment: Generalized weakness;RLE  deficits/detail RLE Deficits / Details: s/p R TKA       Communication Communication Communication: No apparent difficulties Cueing Techniques: Verbal cues   Cognition Arousal: Alert Behavior During Therapy: WFL for tasks assessed/performed Overall Cognitive Status: No family/caregiver present to determine baseline cognitive functioning Area of Impairment: Problem solving                             Problem Solving: Slow processing       General Comments  BP 113/57 (MAP 73), spo2 92% on RA, HR 72 bpm after mobility    Exercises     Shoulder Instructions      Home Living Family/patient expects to be discharged to:: Private residence Living Arrangements: Children (daugther staying with pt initially) Available Help at Discharge: Family Type of Home: Apartment Home Access: Level entry     Home Layout: One level     Bathroom Shower/Tub: Chief Strategy Officer: Standard     Home Equipment: BSC/3in1;Rollator (4 wheels);Cane - single point;Shower seat;Grab bars - tub/shower;Adaptive equipment Adaptive Equipment: Reacher;Sock aid        Prior Functioning/Environment Prior Level of Function : Independent/Modified Independent;History of Falls (last six months)             Mobility Comments: amb with rollator limited community distances, attends PACE program ADLs Comments: generally MOD I for feeding, gromoing, dressing, bathing; assist for IADLs as needed        OT Problem List: Decreased strength;Decreased activity tolerance;Decreased knowledge of use of DME or AE;Impaired balance (sitting and/or standing);Decreased knowledge of precautions      OT Treatment/Interventions: Self-care/ADL training;Therapeutic exercise;Patient/family education;Balance training;Energy conservation;Therapeutic activities;DME and/or AE instruction    OT Goals(Current goals can be found in the care plan section) Acute Rehab OT Goals Patient Stated Goal: return  home OT Goal Formulation: With patient Time For Goal Achievement: 04/07/23 Potential to Achieve Goals: Good ADL Goals Pt Will Perform Grooming: with modified independence;sitting;standing Pt Will Perform Lower Body Dressing: with modified independence;sit to/from stand Pt Will Transfer to Toilet: with modified independence;ambulating Pt Will Perform Toileting - Clothing Manipulation and hygiene: with modified independence;sit to/from stand  OT Frequency: Min 1X/week    Co-evaluation              AM-PAC OT "6 Clicks" Daily Activity     Outcome Measure Help from another person eating meals?: None Help from another person taking care of personal grooming?: None Help from another person toileting, which includes using toliet, bedpan, or urinal?: A Little Help from another person bathing (including washing, rinsing, drying)?: A Little Help from another person to put on and taking off regular upper body clothing?: None Help from another person to put on and taking off regular lower body clothing?: A Little 6 Click Score: 21   End of Session Equipment Utilized During Treatment: Rolling walker (2 wheels) Nurse Communication: Mobility status  Activity Tolerance: Patient tolerated treatment well Patient left: in chair;with call bell/phone within reach  OT Visit Diagnosis: Other abnormalities of gait and mobility (R26.89)                Time: 1036-1100 OT Time Calculation (  min): 24 min Charges:  OT General Charges $OT Visit: 1 Visit OT Evaluation $OT Eval Low Complexity: 1 Low Oleta Mouse, OTD OTR/L  03/24/23, 11:38 AM

## 2023-03-24 NOTE — Discharge Summary (Signed)
Physician Discharge Summary  Subjective: 1 Day Post-Op Procedure(s) (LRB): COMPUTER ASSISTED TOTAL KNEE ARTHROPLASTY (Right) Patient reports pain as mild.   Patient seen in rounds with Dr. Ernest Pine. Patient is well, and has had no acute complaints or problems.  Denies any CP, SOB, N/B, fevers or chills We will continue with therapy today.  Patient is ready to go home  Physician Discharge Summary  Patient ID: Brittany Burke MRN: [id number] DOB/AGE: 08-17-1945 77 y.o.  Admit date: 03/23/2023 Discharge date: 03/24/2023  Admission Diagnoses:  Discharge Diagnoses:  Principal Problem:   Total knee replacement status   Discharged Condition: good  Hospital Course: Patient presented to the hospital on 03/23/2023 for an elective right total knee arthroplasty performed by Dr. Ernest Pine. Patient was given 1 g of TXA and 2 g of Ancef perioperatively. Patient tolerated the procedure well without any complications, lateral releases were performed. See procedural note for details below. Postoperatively, the patient did well, she was able to pass her PT protocols postop day 1. Her JP drain was removed without difficulty, intact. Her vital signs are stable, and she has been able to go to the bathroom. She denies any CP, SOB, N/B, fevers or chills. Vital signs are stable, patient is stable for discharge home.   PROCEDURE:  Right total knee arthroplasty using computer-assisted navigation   SURGEON:  Jena Gauss. M.D.   ASSISTANT:  Gean Birchwood, PA-C (present and scrubbed throughout the case, critical for assistance with exposure, retraction, instrumentation, and closure)   ANESTHESIA: general   ESTIMATED BLOOD LOSS: 50 mL   FLUIDS REPLACED: 800 mL of crystalloid   TOURNIQUET TIME: 88 minutes   DRAINS: 2 medium Hemovac drains   SOFT TISSUE RELEASES: Anterior cruciate ligament, posterior cruciate ligament, deep medial collateral ligament, patellofemoral ligament, and posterolateral corner    IMPLANTS UTILIZED: DePuy Attune size 5 posterior stabilized femoral component (cemented), size 6 rotating platform tibial component (cemented), 35 mm medialized dome patella (cemented), and a 6 mm stabilized rotating platform polyethylene insert.  Treatments: None  Discharge Exam: Blood pressure (!) 99/55, pulse 67, temperature 97.9 F (36.6 C), temperature source Temporal, resp. rate 15, height 5\' 6"  (1.676 m), weight 99.8 kg, SpO2 98%.   Disposition: Home   Allergies as of 03/24/2023   No Known Allergies      Medication List     TAKE these medications    albuterol (2.5 MG/3ML) 0.083% nebulizer solution Commonly known as: PROVENTIL Take 2.5 mg by nebulization every 4 (four) hours as needed for wheezing or shortness of breath (cough).   amLODipine 2.5 MG tablet Commonly known as: NORVASC Take 2.5 mg by mouth every morning.   aspirin 81 MG chewable tablet Chew 1 tablet (81 mg total) by mouth 2 (two) times daily.   celecoxib 200 MG capsule Commonly known as: CELEBREX Take 1 capsule (200 mg total) by mouth 2 (two) times daily. What changed:  medication strength how much to take when to take this   cyanocobalamin 1000 MCG tablet Commonly known as: VITAMIN B12 Take 1,000 mcg by mouth daily.   DULoxetine 60 MG capsule Commonly known as: CYMBALTA Take 120 mg by mouth every morning.   lidocaine 5 % Commonly known as: LIDODERM Place 1 patch onto the skin as needed. Remove & Discard patch within 12 hours or as directed by MD   nortriptyline 25 MG capsule Commonly known as: PAMELOR Take 25 mg by mouth 2 (two) times daily.   oxyCODONE 5 MG immediate release tablet  Commonly known as: Oxy IR/ROXICODONE Take 1 tablet (5 mg total) by mouth every 4 (four) hours as needed for moderate pain (pain score 4-6) (pain score 4-6).   OZEMPIC (0.25 OR 0.5 MG/DOSE) [place] Inject 0.5 mg into the skin once a week. Thursdays   Pataday 0.2 % Soln Generic drug: Olopatadine HCl Place 1  drop into both eyes daily.   pravastatin 40 MG tablet Commonly known as: PRAVACHOL Take 40 mg by mouth at bedtime.   pregabalin 150 MG capsule Commonly known as: LYRICA Take 150 mg by mouth 2 (two) times daily.   PreserVision AREDS 2 Caps Take 2 capsules by mouth daily.   sertraline 50 MG tablet Commonly known as: ZOLOFT Take 50 mg by mouth every morning.   traMADol 50 MG tablet Commonly known as: ULTRAM Take 1-2 tablets (50-100 mg total) by mouth every 4 (four) hours as needed for moderate pain (pain score 4-6).   traZODone 100 MG tablet Commonly known as: DESYREL Take 200 mg by mouth at bedtime.   Vitamin D-3 25 MCG (1000 UT) Caps Take 1 capsule by mouth daily.               Durable Medical Equipment  (From admission, onward)           Start     Ordered   03/23/23 1655  DME Walker rolling  Once       Question:  Patient needs a walker to treat with the following condition  Answer:  Total knee replacement status   03/23/23 1654   03/23/23 1655  DME Bedside commode  Once       Comments: Patient is not able to walk the distance required to go the bathroom, or he/she is unable to safely negotiate stairs required to access the bathroom.  A 3in1 BSC will alleviate this problem  Question:  Patient needs a bedside commode to treat with the following condition  Answer:  Total knee replacement status   03/23/23 1654            Follow-up Information     Rayburn Go, PA-C Follow up on 04/07/2023.   Specialty: Orthopedic Surgery Why: at 9:15am Contact information: [address] [place] [address] [phone]         Donato Heinz, MD Follow up on 05/07/2023.   Specialty: Orthopedic Surgery Why: at 2:45pm Contact information: [address] [facility] [place] [address] [phone]                 Signed: Gean Birchwood 03/24/2023, 8:32 AM   Objective: Vital signs in last 24 hours: Temp:  [97.5 F  (36.4 C)-98.5 F (36.9 C)] 97.9 F (36.6 C) (10/22 0749) Pulse Rate:  [67-79] 67 (10/22 0749) Resp:  [12-21] 15 (10/22 0749) BP: (99-125)/(48-76) 99/55 (10/22 0749) SpO2:  [93 %-100 %] 98 % (10/22 0749) Weight:  [99.8 kg] 99.8 kg (10/21 0931)  Intake/Output from previous day:  Intake/Output Summary (Last 24 hours) at 03/24/2023 0832 Last data filed at 03/24/2023 0500 Gross per 24 hour  Intake 1590.44 ml  Output 640 ml  Net 950.44 ml    Intake/Output this shift: No intake/output data recorded.  Labs: No results for input(s): "HGB" in the last 72 hours. No results for input(s): "WBC", "RBC", "HCT", "PLT" in the last 72 hours. No results for input(s): "NA", "K", "CL", "CO2", "BUN", "CREATININE", "GLUCOSE", "CALCIUM" in the last 72 hours. No results for input(s): "LABPT", "INR" in the  last 72 hours.  EXAM: General - Patient is Alert, Appropriate, and Oriented Extremity - Neurologically intact ABD soft Neurovascular intact Sensation intact distally Intact pulses distally Dorsiflexion/Plantar flexion intact No cellulitis present Compartment soft Dressing - dressing C/D/I and no drainage Motor Function - intact, moving foot and toes well on exam.  Patient able to plantar and dorsiflex with good strength and range of motion.  She is also able to straight leg raise without any difficulty or assistance from provider.  She is neurovascular intact all dermatomes on her right lower extremity.  Posterior tibial pulses appreciated, 2+. JP Drain pulled without difficulty. Intact  Assessment/Plan: 1 Day Post-Op Procedure(s) (LRB): COMPUTER ASSISTED TOTAL KNEE ARTHROPLASTY (Right) Procedure(s) (LRB): COMPUTER ASSISTED TOTAL KNEE ARTHROPLASTY (Right) Past Medical History:  Diagnosis Date   Anxiety    Arthritis    Benign essential tremor    CHF (congestive heart failure) (HCC)    ? on office note2 /16 pcp   Colon polyp    COPD (chronic obstructive pulmonary disease) (HCC)    DDD  (degenerative disc disease), lumbar    Depression    Full dentures    Gait abnormality    HLD (hyperlipidemia)    Hypertension    Insomnia    Leg pain    MRSA nasal colonization 2019   Obesity    Osteoarthritis    Pedal edema    Peripheral neuropathy    Peripheral vascular disease (HCC)    Pneumonia 05/2014   hx   Postherpetic neuralgia    Pre-diabetes    PVD (peripheral vascular disease) (HCC)    RBBB (right bundle branch block)    Spinal stenosis, lumbar region with neurogenic claudication    Tobacco use    Urge incontinence    Vitamin D deficiency    Principal Problem:   Total knee replacement status  Estimated body mass index is 35.51 kg/m as calculated from the following:   Height as of this encounter: 5\' 6"  (1.676 m).   Weight as of this encounter: 99.8 kg.  Patient has passed her PT protocols, and is stable for discharge home.  Look to transition to home health physical therapy to work on strength, ROM and gait with the right lower extremity.  Discussed with the patient continuing to utilize Polar Care   Patient will use bone foam in 20-30 minute intervals   Patient will wear TED hose bilaterally to help prevent DVT and clot formation   Discussed the Aquacel bandage.  This bandage will stay in place 7 days postoperatively.  Can be replaced with honeycomb bandages that will be sent home with the patient   Discussed sending the patient home with tramadol and oxycodone for as needed pain management.  Patient will also be sent home with Celebrex to help with swelling and inflammation.  Patient will take an 81 mg aspirin twice daily for DVT prophylaxis   JP drain removed without difficulty, intact   Weight-Bearing as tolerated to right leg   Patient will follow-up with [facility] clinic orthopedics in 2 weeks for staple removal and reevaluation Diet - Regular diet Follow up - in 2 weeks Activity - WBAT Disposition - Home Condition Upon Discharge - Good DVT  Prophylaxis - Aspirin and TED hose  Danise Edge, PA-C Orthopaedic Surgery 03/24/2023, 8:32 AM

## 2023-03-24 NOTE — Progress Notes (Signed)
Patient is not able to walk the distance required to go the bathroom, or he/she is unable to safely negotiate stairs required to access the bathroom.  A 3in1 BSC will alleviate this problem   Brittany Burke, Jr. M.D.  

## 2023-03-24 NOTE — Progress Notes (Signed)
Subjective: 1 Day Post-Op Procedure(s) (LRB): COMPUTER ASSISTED TOTAL KNEE ARTHROPLASTY (Right) Patient reports pain as mild.   Patient seen in rounds with Dr. Ernest Pine. Patient is well, and has had no acute complaints or problems.  Denies any CP, SOB, N/B, fevers or chills We will start therapy today.  Plan is to go Home after hospital stay.  Objective: Vital signs in last 24 hours: Temp:  [97.5 F (36.4 C)-98.5 F (36.9 C)] 97.9 F (36.6 C) (10/22 0749) Pulse Rate:  [67-79] 67 (10/22 0749) Resp:  [12-21] 15 (10/22 0749) BP: (99-125)/(48-76) 99/55 (10/22 0749) SpO2:  [93 %-100 %] 98 % (10/22 0749) Weight:  [99.8 kg] 99.8 kg (10/21 0931)  Intake/Output from previous day:  Intake/Output Summary (Last 24 hours) at 03/24/2023 0829 Last data filed at 03/24/2023 0500 Gross per 24 hour  Intake 1590.44 ml  Output 640 ml  Net 950.44 ml    Intake/Output this shift: No intake/output data recorded.  Labs: No results for input(s): "HGB" in the last 72 hours. No results for input(s): "WBC", "RBC", "HCT", "PLT" in the last 72 hours. No results for input(s): "NA", "K", "CL", "CO2", "BUN", "CREATININE", "GLUCOSE", "CALCIUM" in the last 72 hours. No results for input(s): "LABPT", "INR" in the last 72 hours.  EXAM General - Patient is Alert, Appropriate, and Oriented Extremity - Neurologically intact ABD soft Neurovascular intact Sensation intact distally Intact pulses distally Dorsiflexion/Plantar flexion intact No cellulitis present Compartment soft Dressing - dressing C/D/I and no drainage Motor Function - intact, moving foot and toes well on exam.  Patient able to plantar and dorsiflex with good strength and range of motion.  She is also able to straight leg raise without any difficulty or assistance from provider.  She is neurovascular intact all dermatomes on her right lower extremity.  Posterior tibial pulses appreciated, 2+. JP Drain pulled without difficulty. Intact  Past  Medical History:  Diagnosis Date   Anxiety    Arthritis    Benign essential tremor    CHF (congestive heart failure) (HCC)    ? on office note2 /16 pcp   Colon polyp    COPD (chronic obstructive pulmonary disease) (HCC)    DDD (degenerative disc disease), lumbar    Depression    Full dentures    Gait abnormality    HLD (hyperlipidemia)    Hypertension    Insomnia    Leg pain    MRSA nasal colonization 2019   Obesity    Osteoarthritis    Pedal edema    Peripheral neuropathy    Peripheral vascular disease (HCC)    Pneumonia 05/2014   hx   Postherpetic neuralgia    Pre-diabetes    PVD (peripheral vascular disease) (HCC)    RBBB (right bundle branch block)    Spinal stenosis, lumbar region with neurogenic claudication    Tobacco use    Urge incontinence    Vitamin D deficiency     Assessment/Plan: 1 Day Post-Op Procedure(s) (LRB): COMPUTER ASSISTED TOTAL KNEE ARTHROPLASTY (Right) Principal Problem:   Total knee replacement status  Estimated body mass index is 35.51 kg/m as calculated from the following:   Height as of this encounter: 5\' 6"  (1.676 m).   Weight as of this encounter: 99.8 kg. Advance diet Up with therapy  Patient will continue to work with physical therapy to pass postoperative PT protocols, ROM and strengthening  Discussed with the patient continuing to utilize Polar Care  Patient will use bone foam in 20-30 minute intervals  Patient will wear TED hose bilaterally to help prevent DVT and clot formation  Discussed the Aquacel bandage.  This bandage will stay in place 7 days postoperatively.  Can be replaced with honeycomb bandages that will be sent home with the patient  Discussed sending the patient home with tramadol and oxycodone for as needed pain management.  Patient will also be sent home with Celebrex to help with swelling and inflammation.  Patient will take an 81 mg aspirin twice daily for DVT prophylaxis  JP drain removed without  difficulty, intact  Weight-Bearing as tolerated to right leg  Patient will follow-up with [facility] orth[facility]ics in 2 weeks for staple removal and reevaluation  Rayburn Go, PA-C [facility] 03/24/2023, 8:29 AM

## 2023-03-24 NOTE — TOC Progression Note (Signed)
Transition of Care [facility]) - Progression Note    Patient Details  Name: Brittany Burke MRN: [id number] Date of Birth: 27-Jun-1945  Transition of Care [facility]) CM/SW Contact  Marlowe Sax, RN Phone Number: 03/24/2023, 8:42 AM  Clinical Narrative:    Contacted [facility], they will provide a rolling walker and 3 in 1 for the patient and also provide PT and OT, they will pick her up and take her to the [facility] facility for these     Barriers to Discharge: No Barriers Identified  Expected Discharge Plan and Services   Discharge Planning Services: CM Consult   Living arrangements for the past 2 months: Single Family Home Expected Discharge Date: 03/24/23                   Date DME Agency Contacted: 03/24/23 Time DME Agency Contacted: [phone] Representative spoke with at DME Agency: [facility] HH Arranged: PT, OT   Date HH Agency Contacted: 03/24/23 Time HH Agency Contacted: [phone] Representative spoke with at [facility] Agency: [facility]   Social Determinants of Health (SDOH) Interventions SDOH Screenings   Food Insecurity: No Food Insecurity (03/23/2023)  Housing: Low Risk  (03/23/2023)  Transportation Needs: No Transportation Needs (03/23/2023)  Utilities: Not At Risk (03/23/2023)  Tobacco Use: High Risk (03/23/2023)    Readmission Risk Interventions     No data to display

## 2023-03-24 NOTE — Anesthesia Postprocedure Evaluation (Signed)
Anesthesia Post Note  Patient: AZIZI LILE  Procedure(s) Performed: COMPUTER ASSISTED TOTAL KNEE ARTHROPLASTY (Right: Knee)  Patient location during evaluation: Nursing Unit Anesthesia Type: Spinal Level of consciousness: oriented and awake and alert Pain management: pain level controlled Vital Signs Assessment: post-procedure vital signs reviewed and stable Respiratory status: spontaneous breathing and respiratory function stable Cardiovascular status: blood pressure returned to baseline and stable Postop Assessment: no headache, no backache, no apparent nausea or vomiting and patient able to bend at knees Anesthetic complications: no   No notable events documented.   Last Vitals:  Vitals:   03/23/23 2128 03/24/23 0500  BP: (!) 110/57 (!) 108/48  Pulse: 69 76  Resp: 16 16  Temp: (!) 36.4 C 36.9 C  SpO2: 93% 94%    Last Pain:  Vitals:   03/24/23 0727  TempSrc:   PainSc: Asleep                 Karoline Caldwell

## 2023-03-24 NOTE — Progress Notes (Signed)
DISCHARGE NOTE:  Pt given discharge instructions and scripts. TED hose on both legs. 2 honeycomb dressings sent with pt. Pt wheeled to car by staff, [facility] providing transportation.

## 2023-08-25 ENCOUNTER — Ambulatory Visit
Admission: RE | Admit: 2023-08-25 | Discharge: 2023-08-25 | Disposition: A | Discharge status: Home / Self Care | Payer: Medicare (Managed Care) | Source: Ambulatory Visit | Attending: Family Medicine | Admitting: Family Medicine

## 2023-08-25 ENCOUNTER — Ambulatory Visit
Admission: RE | Admit: 2023-08-25 | Discharge: 2023-08-25 | Disposition: A | Discharge status: Home / Self Care | Payer: Medicare (Managed Care) | Source: Ambulatory Visit | Attending: Family Medicine

## 2023-08-25 DIAGNOSIS — R928 Other abnormal and inconclusive findings on diagnostic imaging of breast: Secondary | ICD-10-CM | POA: Insufficient documentation

## 2023-08-26 ENCOUNTER — Other Ambulatory Visit: Payer: Self-pay | Admitting: Family Medicine

## 2023-08-26 DIAGNOSIS — R928 Other abnormal and inconclusive findings on diagnostic imaging of breast: Secondary | ICD-10-CM

## 2023-09-03 ENCOUNTER — Ambulatory Visit
Admission: RE | Admit: 2023-09-03 | Discharge: 2023-09-03 | Disposition: A | Discharge status: Home / Self Care | Payer: Medicare (Managed Care) | Source: Ambulatory Visit | Attending: Family Medicine | Admitting: Family Medicine

## 2023-09-03 DIAGNOSIS — R928 Other abnormal and inconclusive findings on diagnostic imaging of breast: Secondary | ICD-10-CM | POA: Diagnosis present

## 2023-09-03 DIAGNOSIS — N6002 Solitary cyst of left breast: Secondary | ICD-10-CM | POA: Diagnosis not present

## 2023-09-03 MED ORDER — LIDOCAINE HCL 1 % IJ SOLN
3.0000 mL | Freq: Once | INTRAMUSCULAR | Status: AC
  Administered 2023-09-03: 3 mL
  Filled 2023-09-03: qty 3

## 2023-09-04 LAB — CYTOLOGY - NON PAP

## 2024-04-12 ENCOUNTER — Ambulatory Visit: Admitting: Student in an Organized Health Care Education/Training Program

## 2024-05-08 ENCOUNTER — Emergency Department

## 2024-05-08 ENCOUNTER — Other Ambulatory Visit: Payer: Self-pay

## 2024-05-08 ENCOUNTER — Inpatient Hospital Stay
Admission: EM | Admit: 2024-05-08 | Discharge: 2024-05-13 | DRG: 660 | Disposition: A | Discharge status: Skilled Nursing Facility | Attending: Internal Medicine | Admitting: Internal Medicine

## 2024-05-08 DIAGNOSIS — Z6832 Body mass index (BMI) 32.0-32.9, adult: Secondary | ICD-10-CM | POA: Diagnosis not present

## 2024-05-08 DIAGNOSIS — N3000 Acute cystitis without hematuria: Secondary | ICD-10-CM

## 2024-05-08 DIAGNOSIS — I10 Essential (primary) hypertension: Secondary | ICD-10-CM | POA: Diagnosis not present

## 2024-05-08 DIAGNOSIS — J449 Chronic obstructive pulmonary disease, unspecified: Secondary | ICD-10-CM | POA: Diagnosis present

## 2024-05-08 DIAGNOSIS — K5900 Constipation, unspecified: Secondary | ICD-10-CM | POA: Diagnosis not present

## 2024-05-08 DIAGNOSIS — N201 Calculus of ureter: Secondary | ICD-10-CM | POA: Diagnosis not present

## 2024-05-08 DIAGNOSIS — R748 Abnormal levels of other serum enzymes: Secondary | ICD-10-CM | POA: Diagnosis present

## 2024-05-08 DIAGNOSIS — N200 Calculus of kidney: Secondary | ICD-10-CM | POA: Diagnosis not present

## 2024-05-08 DIAGNOSIS — E66811 Obesity, class 1: Secondary | ICD-10-CM

## 2024-05-08 DIAGNOSIS — N202 Calculus of kidney with calculus of ureter: Secondary | ICD-10-CM | POA: Diagnosis present

## 2024-05-08 DIAGNOSIS — Z7985 Long-term (current) use of injectable non-insulin antidiabetic drugs: Secondary | ICD-10-CM | POA: Diagnosis not present

## 2024-05-08 DIAGNOSIS — R531 Weakness: Secondary | ICD-10-CM

## 2024-05-08 DIAGNOSIS — I110 Hypertensive heart disease with heart failure: Secondary | ICD-10-CM | POA: Diagnosis present

## 2024-05-08 DIAGNOSIS — N3090 Cystitis, unspecified without hematuria: Principal | ICD-10-CM

## 2024-05-08 DIAGNOSIS — E876 Hypokalemia: Secondary | ICD-10-CM | POA: Diagnosis present

## 2024-05-08 DIAGNOSIS — E559 Vitamin D deficiency, unspecified: Secondary | ICD-10-CM | POA: Diagnosis present

## 2024-05-08 DIAGNOSIS — Z8249 Family history of ischemic heart disease and other diseases of the circulatory system: Secondary | ICD-10-CM | POA: Diagnosis not present

## 2024-05-08 DIAGNOSIS — D696 Thrombocytopenia, unspecified: Secondary | ICD-10-CM | POA: Diagnosis not present

## 2024-05-08 DIAGNOSIS — F17210 Nicotine dependence, cigarettes, uncomplicated: Secondary | ICD-10-CM | POA: Diagnosis present

## 2024-05-08 DIAGNOSIS — F419 Anxiety disorder, unspecified: Secondary | ICD-10-CM | POA: Diagnosis present

## 2024-05-08 DIAGNOSIS — G8929 Other chronic pain: Secondary | ICD-10-CM | POA: Diagnosis present

## 2024-05-08 DIAGNOSIS — G629 Polyneuropathy, unspecified: Secondary | ICD-10-CM | POA: Diagnosis present

## 2024-05-08 DIAGNOSIS — F32A Depression, unspecified: Secondary | ICD-10-CM | POA: Diagnosis present

## 2024-05-08 DIAGNOSIS — R296 Repeated falls: Secondary | ICD-10-CM | POA: Diagnosis present

## 2024-05-08 DIAGNOSIS — N3001 Acute cystitis with hematuria: Secondary | ICD-10-CM | POA: Diagnosis not present

## 2024-05-08 DIAGNOSIS — N390 Urinary tract infection, site not specified: Secondary | ICD-10-CM | POA: Diagnosis present

## 2024-05-08 DIAGNOSIS — E782 Mixed hyperlipidemia: Secondary | ICD-10-CM | POA: Diagnosis present

## 2024-05-08 DIAGNOSIS — N83202 Unspecified ovarian cyst, left side: Secondary | ICD-10-CM | POA: Diagnosis present

## 2024-05-08 DIAGNOSIS — Z1152 Encounter for screening for COVID-19: Secondary | ICD-10-CM | POA: Diagnosis not present

## 2024-05-08 DIAGNOSIS — D329 Benign neoplasm of meninges, unspecified: Secondary | ICD-10-CM | POA: Diagnosis present

## 2024-05-08 DIAGNOSIS — N179 Acute kidney failure, unspecified: Secondary | ICD-10-CM

## 2024-05-08 LAB — CBC WITH DIFFERENTIAL/PLATELET
Abs Immature Granulocytes: 0.1 K/uL — ABNORMAL HIGH (ref 0.00–0.07)
Basophils Absolute: 0 K/uL (ref 0.0–0.1)
Basophils Relative: 0 %
Eosinophils Absolute: 0 K/uL (ref 0.0–0.5)
Eosinophils Relative: 0 %
HCT: 37.1 % (ref 36.0–46.0)
Hemoglobin: 12.3 g/dL (ref 12.0–15.0)
Immature Granulocytes: 1 %
Lymphocytes Relative: 4 %
Lymphs Abs: 0.4 K/uL — ABNORMAL LOW (ref 0.7–4.0)
MCH: 27.3 pg (ref 26.0–34.0)
MCHC: 33.2 g/dL (ref 30.0–36.0)
MCV: 82.3 fL (ref 80.0–100.0)
Monocytes Absolute: 0.5 K/uL (ref 0.1–1.0)
Monocytes Relative: 5 %
Neutro Abs: 8.7 K/uL — ABNORMAL HIGH (ref 1.7–7.7)
Neutrophils Relative %: 90 %
Platelets: 157 K/uL (ref 150–400)
RBC: 4.51 MIL/uL (ref 3.87–5.11)
RDW: 15.5 % (ref 11.5–15.5)
WBC: 9.7 K/uL (ref 4.0–10.5)
nRBC: 0 % (ref 0.0–0.2)

## 2024-05-08 LAB — URINALYSIS, W/ REFLEX TO CULTURE (INFECTION SUSPECTED)
Bilirubin Urine: NEGATIVE
Glucose, UA: NEGATIVE mg/dL
Hgb urine dipstick: NEGATIVE
Ketones, ur: 20 mg/dL — AB
Nitrite: NEGATIVE
Protein, ur: 100 mg/dL — AB
Specific Gravity, Urine: 1.024 (ref 1.005–1.030)
pH: 5 (ref 5.0–8.0)

## 2024-05-08 LAB — RESP PANEL BY RT-PCR (RSV, FLU A&B, COVID)  RVPGX2
Influenza A by PCR: NEGATIVE
Influenza B by PCR: NEGATIVE
Resp Syncytial Virus by PCR: NEGATIVE
SARS Coronavirus 2 by RT PCR: NEGATIVE

## 2024-05-08 LAB — COMPREHENSIVE METABOLIC PANEL WITH GFR
ALT: 67 U/L — ABNORMAL HIGH (ref 0–44)
AST: 149 U/L — ABNORMAL HIGH (ref 15–41)
Albumin: 3.6 g/dL (ref 3.5–5.0)
Alkaline Phosphatase: 162 U/L — ABNORMAL HIGH (ref 38–126)
Anion gap: 17 — ABNORMAL HIGH (ref 5–15)
BUN: 19 mg/dL (ref 8–23)
CO2: 21 mmol/L — ABNORMAL LOW (ref 22–32)
Calcium: 9.4 mg/dL (ref 8.9–10.3)
Chloride: 98 mmol/L (ref 98–111)
Creatinine, Ser: 1.05 mg/dL — ABNORMAL HIGH (ref 0.44–1.00)
GFR, Estimated: 54 mL/min — ABNORMAL LOW (ref >60)
Glucose, Bld: 93 mg/dL (ref 70–99)
Potassium: 3.4 mmol/L — ABNORMAL LOW (ref 3.5–5.1)
Sodium: 136 mmol/L (ref 135–145)
Total Bilirubin: 0.6 mg/dL (ref 0.0–1.2)
Total Protein: 7.1 g/dL (ref 6.5–8.1)

## 2024-05-08 LAB — LACTIC ACID, PLASMA: Lactic Acid, Venous: 0.8 mmol/L (ref 0.5–1.9)

## 2024-05-08 MED ORDER — KETOROLAC TROMETHAMINE 15 MG/ML IJ SOLN
7.5000 mg | Freq: Once | INTRAMUSCULAR | Status: AC
  Administered 2024-05-08: 7.5 mg via INTRAVENOUS
  Filled 2024-05-08: qty 1

## 2024-05-08 MED ORDER — SODIUM CHLORIDE 0.9 % IV SOLN
1.0000 g | INTRAVENOUS | Status: AC
  Administered 2024-05-08: 1 g via INTRAVENOUS
  Filled 2024-05-08: qty 10

## 2024-05-08 MED ORDER — ONDANSETRON HCL 4 MG/2ML IJ SOLN
4.0000 mg | Freq: Once | INTRAMUSCULAR | Status: AC
  Administered 2024-05-08: 4 mg via INTRAVENOUS
  Filled 2024-05-08: qty 2

## 2024-05-08 MED ORDER — SODIUM CHLORIDE 0.9 % IV BOLUS
1000.0000 mL | Freq: Once | INTRAVENOUS | Status: AC
  Administered 2024-05-08: 1000 mL via INTRAVENOUS

## 2024-05-08 MED ORDER — TAMSULOSIN HCL 0.4 MG PO CAPS
0.4000 mg | ORAL_CAPSULE | ORAL | Status: AC
  Administered 2024-05-08: 0.4 mg via ORAL
  Filled 2024-05-08: qty 1

## 2024-05-08 NOTE — ED Provider Notes (Signed)
 [facility] Provider Note    Event Date/Time   First MD Initiated Contact with Patient 05/08/24 1722     (approximate)   History   Chief Complaint: Fall and Urinary Frequency   HPI  Brittany Burke is a 78 y.o. female with a history of hypertension, anxiety, COPD, CHF who is brought to the ED due to frequent falls, and noted to be confused today.  Also reported to have urinary frequency and malodorous urine.  Patient reports increase in her back pain.        Past Medical History:  Diagnosis Date   Anxiety    Arthritis    Benign essential tremor    CHF (congestive heart failure) (HCC)    ? on office note2 /16 pcp   Colon polyp    COPD (chronic obstructive pulmonary disease) (HCC)    DDD (degenerative disc disease), lumbar    Depression    Full dentures    Gait abnormality    HLD (hyperlipidemia)    Hypertension    Insomnia    Leg pain    MRSA nasal colonization 2019   Obesity    Osteoarthritis    Pedal edema    Peripheral neuropathy    Peripheral vascular disease    Pneumonia 05/2014   hx   Postherpetic neuralgia    Pre-diabetes    PVD (peripheral vascular disease)    RBBB (right bundle branch block)    Spinal stenosis, lumbar region with neurogenic claudication    Tobacco use    Urge incontinence    Vitamin D deficiency     Current Outpatient Rx   Order #: [id number] Class: Historical Med   Order #: [id number] Class: Historical Med   Order #: [id number] Class: No Print   Order #: [id number] Class: Print   Order #: [id number] Class: Historical Med   Order #: [id number] Class: Historical Med   Order #: [id number] Class: Historical Med   Order #: [id number] Class: Historical Med   Order #: [id number] Class: Historical Med   Order #: [id number] Class: Historical Med   Order #: [id number] Class: Historical Med   Order #: [id number] Class: Print   Order #: [id number] Class: Historical Med   Order #: [id number] Class: Historical Med   Order #:  [id number] Class: Historical Med   Order #: [id number] Class: Historical Med   Order #: [id number] Class: Print   Order #: [id number] Class: Historical Med    Past Surgical History:  Procedure Laterality Date   ABDOMINAL HYSTERECTOMY  1972   BREAST BIOPSY Right 2011   benign   BREAST BIOPSY Left 04/13/2019   Affirm bx-X clip-benign   CATARACT EXTRACTION W/PHACO Right 11/08/2019   Procedure: 7.30  00:51.4;  Surgeon: Jaye Fallow, MD;  [facility];  Service: Ophthalmology;  Laterality: Right;   CATARACT EXTRACTION W/PHACO Left 12/06/2019   Procedure: CATARACT EXTRACTION PHACO AND INTRAOCULAR LENS PLACEMENT (IOC) LEFT DIABETIC 4.62  00:35.1;  Surgeon: Jaye Fallow, MD;  Location: [facility] SURGERY [facility]TR;  Service: Ophthalmology;  Laterality: Left;   COLONOSCOPY WITH PROPOFOL  N/A 04/19/2015   Procedure: COLONOSCOPY WITH PROPOFOL ;  Surgeon: Lamar ONEIDA Holmes, MD;  Location: [facility] ENDOSCOPY;  Service: Endoscopy;  Laterality: N/A;   COLONOSCOPY WITH PROPOFOL  N/A 04/22/2019   Procedure: COLONOSCOPY WITH PROPOFOL ;  Surgeon: Dessa Reyes ORN, MD;  Location: [facility] ENDOSCOPY;  Service: Endoscopy;  Laterality: N/A;   KNEE ARTHROPLASTY Right 03/23/2023   Procedure: COMPUTER ASSISTED TOTAL KNEE ARTHROPLASTY;  Surgeon: Mardee Lynwood SQUIBB, MD;  Location: [facility] ORS;  Service: Orthopedics;  Laterality: Right;  LUMBAR LAMINECTOMY/DECOMPRESSION MICRODISCECTOMY N/A 08/23/2014   Procedure: LUMBAR THREE-FOUR, LUMBAR FOUR-FIVE LUMBAR LAMINECTOMY/DECOMPRESSION MICRODISCECTOMY 2 LEVELS;  Surgeon: Reyes Budge, MD;  Location: [facility] NEURO ORS;  Service: Neurosurgery;  Laterality: N/A;  L34 L45 Laminectomies   TOTAL HIP ARTHROPLASTY Right 09/28/2017   Procedure: TOTAL HIP ARTHROPLASTY;  Surgeon: Mardee Lynwood SQUIBB, MD;  Location: [facility] ORS;  Service: Orthopedics;  Laterality: Right;    Physical Exam   Triage Vital Signs: ED Triage Vitals  Encounter Vitals Group     BP 05/08/24 1331 (!) 126/51     Girls  Systolic BP Percentile --      Girls Diastolic BP Percentile --      Boys Systolic BP Percentile --      Boys Diastolic BP Percentile --      Pulse Rate 05/08/24 1331 86     Resp 05/08/24 1331 20     Temp 05/08/24 1331 99.4 F (37.4 C)     Temp Source 05/08/24 1331 Oral     SpO2 05/08/24 1331 93 %     Weight 05/08/24 1334 200 lb (90.7 kg)     Height 05/08/24 1334 5' 6 (1.676 m)     Head Circumference --      Peak Flow --      Pain Score 05/08/24 1332 6     Pain Loc --      Pain Education --      Exclude from Growth Chart --     Most recent vital signs: Vitals:   05/08/24 2201 05/08/24 2202  BP: 119/60   Pulse: 75   Resp: 18   Temp:  97.9 F (36.6 C)  SpO2: 98%     General: Awake, no distress.  CV:  Good peripheral perfusion.  Regular rate rhythm Resp:  Normal effort.  Clear lungs Abd:  No distention.  Soft with suprapubic tenderness.  No CVA tenderness. Other:  Moist oral mucosa   ED Results / Procedures / Treatments   Labs (all labs ordered are listed, but only abnormal results are displayed) Labs Reviewed  COMPREHENSIVE METABOLIC PANEL WITH GFR - Abnormal; Notable for the following components:      Result Value   Potassium 3.4 (*)    CO2 21 (*)    Creatinine, Ser 1.05 (*)    AST 149 (*)    ALT 67 (*)    Alkaline Phosphatase 162 (*)    GFR, Estimated 54 (*)    Anion gap 17 (*)    All other components within normal limits  CBC WITH DIFFERENTIAL/PLATELET - Abnormal; Notable for the following components:   Neutro Abs 8.7 (*)    Lymphs Abs 0.4 (*)    Abs Immature Granulocytes 0.10 (*)    All other components within normal limits  URINALYSIS, W/ REFLEX TO CULTURE (INFECTION SUSPECTED) - Abnormal; Notable for the following components:   Color, Urine AMBER (*)    APPearance CLOUDY (*)    Ketones, ur 20 (*)    Protein, ur 100 (*)    Leukocytes,Ua SMALL (*)    Bacteria, UA MANY (*)    All other components within normal limits  RESP PANEL BY RT-PCR (RSV, FLU  A&B, COVID)  RVPGX2  URINE CULTURE  LACTIC ACID, PLASMA     EKG    RADIOLOGY Chest x-ray interpreted by me, unremarkable.  Radiology report reviewed  CT head and C-spine unremarkable  CT renal stone study shows 5 mm stone at the right UPJ with signs of obstruction.  PROCEDURES:  Procedures   MEDICATIONS ORDERED IN ED: Medications  tamsulosin  (FLOMAX ) capsule 0.4 mg (has no administration in time range)  cefTRIAXone  (ROCEPHIN ) 1 g in sodium chloride  0.9 % 100 mL IVPB (0 g Intravenous Stopped 05/08/24 2106)  sodium chloride  0.9 % bolus 1,000 mL (1,000 mLs Intravenous New Bag/Given 05/08/24 2004)  ketorolac  (TORADOL ) 15 MG/ML injection 7.5 mg (7.5 mg Intravenous Given 05/08/24 2105)  ondansetron  (ZOFRAN ) injection 4 mg (4 mg Intravenous Given 05/08/24 2105)     IMPRESSION / MDM / ASSESSMENT AND PLAN / ED COURSE  I reviewed the triage vital signs and the nursing notes.  DDx: UTI, AKI, electrolyte derangement, anemia, COVID, influenza  Patient's presentation is most consistent with acute presentation with potential threat to life or bodily function.  Patient presents with generalized weakness, malodorous urine.  Labs reveal UTI, mild AKI compared to baseline creatinine of 0.5.  Vital signs are normal, no leukocytosis.  CT abdomen pelvis obtained which is positive for a 5 mm obstructing stone in the right UPJ.  No evidence of pyelonephritis or sepsis currently.  Discussed with urology Dr. Watt who agrees with overnight hospitalist observation, IV fluids, antibiotic coverage, urology evaluation in the morning.  Discussed with hospitalist.       FINAL CLINICAL IMPRESSION(S) / ED DIAGNOSES   Final diagnoses:  Cystitis  AKI (acute kidney injury)  General weakness  Ureterolithiasis     Rx / DC Orders   ED Discharge Orders     None        Note:  This document was prepared using Dragon voice recognition software and may include unintentional dictation errors.    Viviann Pastor, MD 05/08/24 2235

## 2024-05-08 NOTE — H&P (Signed)
 History and Physical    Patient: Brittany Burke FMW:[id number] DOB: 1945-06-16 DOA: 05/08/2024 DOS: the patient was seen and examined on 05/08/2024 PCP: Lionell Sonny JULIANNA, NP  Patient coming from: {Point_of_Origin:[address]}  Chief Complaint:  Chief Complaint  Patient presents with   Fall   Urinary Frequency   HPI: Brittany Burke is a 78 y.o. female with medical history significant of ***  Review of Systems: {ROS_Text:26778} Past Medical History:  Diagnosis Date   Anxiety    Arthritis    Benign essential tremor    CHF (congestive heart failure) (HCC)    ? on office note2 /16 pcp   Colon polyp    COPD (chronic obstructive pulmonary disease) (HCC)    DDD (degenerative disc disease), lumbar    Depression    Full dentures    Gait abnormality    HLD (hyperlipidemia)    Hypertension    Insomnia    Leg pain    MRSA nasal colonization 2019   Obesity    Osteoarthritis    Pedal edema    Peripheral neuropathy    Peripheral vascular disease    Pneumonia 05/2014   hx   Postherpetic neuralgia    Pre-diabetes    PVD (peripheral vascular disease)    RBBB (right bundle branch block)    Spinal stenosis, lumbar region with neurogenic claudication    Tobacco use    Urge incontinence    Vitamin D deficiency    Past Surgical History:  Procedure Laterality Date   ABDOMINAL HYSTERECTOMY  1972   BREAST BIOPSY Right 2011   benign   BREAST BIOPSY Left 04/13/2019   Affirm bx-X clip-benign   CATARACT EXTRACTION W/PHACO Right 11/08/2019   Procedure: 7.30  00:51.4;  Surgeon: Jaye Fallow, MD;  Location: [facility];  Service: Ophthalmology;  Laterality: Right;   CATARACT EXTRACTION W/PHACO Left 12/06/2019   Procedure: CATARACT EXTRACTION PHACO AND INTRAOCULAR LENS PLACEMENT (IOC) LEFT DIABETIC 4.62  00:35.1;  Surgeon: Jaye Fallow, MD;  Location: [facility] SURGERY [facility];  Service: Ophthalmology;  Laterality: Left;   COLONOSCOPY WITH PROPOFOL  N/A 04/19/2015   Procedure:  COLONOSCOPY WITH PROPOFOL ;  Surgeon: Lamar ONEIDA Holmes, MD;  Location: [facility] ENDOSCOPY;  Service: Endoscopy;  Laterality: N/A;   COLONOSCOPY WITH PROPOFOL  N/A 04/22/2019   Procedure: COLONOSCOPY WITH PROPOFOL ;  Surgeon: Dessa Reyes ORN, MD;  Location: [facility] ENDOSCOPY;  Service: Endoscopy;  Laterality: N/A;   KNEE ARTHROPLASTY Right 03/23/2023   Procedure: COMPUTER ASSISTED TOTAL KNEE ARTHROPLASTY;  Surgeon: Mardee Lynwood SQUIBB, MD;  Location: [facility] ORS;  Service: Orthopedics;  Laterality: Right;   LUMBAR LAMINECTOMY/DECOMPRESSION MICRODISCECTOMY N/A 08/23/2014   Procedure: LUMBAR THREE-FOUR, LUMBAR FOUR-FIVE LUMBAR LAMINECTOMY/DECOMPRESSION MICRODISCECTOMY 2 LEVELS;  Surgeon: Reyes Budge, MD;  Location: [facility] NEURO ORS;  Service: Neurosurgery;  Laterality: N/A;  L34 L45 Laminectomies   TOTAL HIP ARTHROPLASTY Right 09/28/2017   Procedure: TOTAL HIP ARTHROPLASTY;  Surgeon: Mardee Lynwood SQUIBB, MD;  Location: [facility] ORS;  Service: Orthopedics;  Laterality: Right;   Social History:  reports that she has been smoking cigarettes. She has a 22 pack-year smoking history. She has never used smokeless tobacco. She reports that she does not drink alcohol  and does not use drugs.  No Known Allergies  Family History  Problem Relation Age of Onset   Breast cancer Cousin 94   Hypertension Mother    Hypertension Daughter    Diabetes Maternal Grandmother     Prior to Admission medications   Medication Sig Start Date End Date Taking? Authorizing  Provider  albuterol  (PROVENTIL ) (2.5 MG/3ML) 0.083% nebulizer solution Take 2.5 mg by nebulization every 4 (four) hours as needed for wheezing or shortness of breath (cough).    [provider]  amLODipine  (NORVASC ) 2.5 MG tablet Take 2.5 mg by mouth every morning.    [provider]  aspirin  81 MG chewable tablet Chew 1 tablet (81 mg total) by mouth 2 (two) times daily. 03/24/23   Drake Chew, PA-C  celecoxib  (CELEBREX ) 200 MG capsule Take 1 capsule  (200 mg total) by mouth 2 (two) times daily. 03/24/23   Drake Chew, PA-C  Cholecalciferol (VITAMIN D-3) 25 MCG (1000 UT) CAPS Take 1 capsule by mouth daily.    [provider]  cyanocobalamin (VITAMIN B12) 1000 MCG tablet Take 1,000 mcg by mouth daily.    [provider]  DULoxetine  (CYMBALTA ) 60 MG capsule Take 120 mg by mouth every morning.    [provider]  lidocaine  (LIDODERM ) 5 % Place 1 patch onto the skin as needed. Remove & Discard patch within 12 hours or as directed by MD    [provider]  Multiple Vitamins-Minerals (PRESERVISION AREDS 2) CAPS Take 2 capsules by mouth daily.    [provider]  nortriptyline  (PAMELOR ) 25 MG capsule Take 25 mg by mouth 2 (two) times daily.    [provider]  Olopatadine  HCl (PATADAY ) 0.2 % SOLN Place 1 drop into both eyes daily.    [provider]  oxyCODONE  (OXY IR/ROXICODONE ) 5 MG immediate release tablet Take 1 tablet (5 mg total) by mouth every 4 (four) hours as needed for moderate pain (pain score 4-6) (pain score 4-6). 03/24/23   Drake Chew, PA-C  pravastatin  (PRAVACHOL ) 40 MG tablet Take 40 mg by mouth at bedtime.    [provider]  pregabalin  (LYRICA ) 150 MG capsule Take 150 mg by mouth 2 (two) times daily.    [provider]  Semaglutide (OZEMPIC, 0.25 OR 0.5 MG/DOSE, [place]) Inject 0.5 mg into the skin once a week. Thursdays    [provider]  sertraline  (ZOLOFT ) 50 MG tablet Take 50 mg by mouth every morning.    [provider]  traMADol  (ULTRAM ) 50 MG tablet Take 1-2 tablets (50-100 mg total) by mouth every 4 (four) hours as needed for moderate pain (pain score 4-6). 03/24/23   Drake Chew, PA-C  traZODone  (DESYREL ) 100 MG tablet Take 200 mg by mouth at bedtime.    [provider]    Physical Exam: Vitals:   05/08/24 1926 05/08/24 2100 05/08/24 2201 05/08/24 2202  BP:  125/67 119/60   Pulse:  76 75   Resp:   18    Temp:    97.9 F (36.6 C)  TempSrc:    Oral  SpO2: 97% 100% 98%   Weight:      Height:       *** Data Reviewed: {Tip this will not be part of the note when signed- Document your independent interpretation of telemetry tracing, EKG, lab, Radiology test or any other diagnostic tests. Add any new diagnostic test ordered today. (Optional):26781} {Results:26384}  Assessment and Plan: No notes have been filed under this hospital service. Service: Hospitalist     Advance Care Planning:   Code Status: Prior ***  Consults: ***  Family Communication: ***  Severity of Illness: {Observation/Inpatient:21159}  Author: Posey Maier, DO 05/08/2024 10:17 PM  For on call review [email or web address].

## 2024-05-08 NOTE — ED Notes (Signed)
 Notified EDP Stafford of pt request for pain and nausea medication.

## 2024-05-08 NOTE — ED Notes (Signed)
 1 set cultures and blue top sent at this time.

## 2024-05-08 NOTE — ED Triage Notes (Signed)
 Pt to ED from home [facility] for frequent falls and possible sepsis. Chronic back pain, takes 3 pain meds. Pt is confused per EMS. Pt removed glasses with EMS then didn't know where they were. Usually fully independent. Home health nurse told EMS pt is altered compared to baseline.   EMS VS: ETCO2 was <25, 99.8 temp, CBG 109, HR 95, BP 118/86.   Pt reports urinary frequency and odor. Pt states did fall but unsure when. States always has back pain. No obvious injuries.   Pt is oriented X4 at this time.

## 2024-05-09 LAB — CBC
HCT: 33.2 % — ABNORMAL LOW (ref 36.0–46.0)
Hemoglobin: 10.8 g/dL — ABNORMAL LOW (ref 12.0–15.0)
MCH: 27.3 pg (ref 26.0–34.0)
MCHC: 32.5 g/dL (ref 30.0–36.0)
MCV: 84.1 fL (ref 80.0–100.0)
Platelets: 137 K/uL — ABNORMAL LOW (ref 150–400)
RBC: 3.95 MIL/uL (ref 3.87–5.11)
RDW: 15.9 % — ABNORMAL HIGH (ref 11.5–15.5)
WBC: 9.5 K/uL (ref 4.0–10.5)
nRBC: 0 % (ref 0.0–0.2)

## 2024-05-09 LAB — COMPREHENSIVE METABOLIC PANEL WITH GFR
ALT: 57 U/L — ABNORMAL HIGH (ref 0–44)
AST: 103 U/L — ABNORMAL HIGH (ref 15–41)
Albumin: 3.1 g/dL — ABNORMAL LOW (ref 3.5–5.0)
Alkaline Phosphatase: 133 U/L — ABNORMAL HIGH (ref 38–126)
Anion gap: 17 — ABNORMAL HIGH (ref 5–15)
BUN: 21 mg/dL (ref 8–23)
CO2: 19 mmol/L — ABNORMAL LOW (ref 22–32)
Calcium: 8.7 mg/dL — ABNORMAL LOW (ref 8.9–10.3)
Chloride: 101 mmol/L (ref 98–111)
Creatinine, Ser: 1.04 mg/dL — ABNORMAL HIGH (ref 0.44–1.00)
GFR, Estimated: 55 mL/min — ABNORMAL LOW (ref >60)
Glucose, Bld: 82 mg/dL (ref 70–99)
Potassium: 3.6 mmol/L (ref 3.5–5.1)
Sodium: 138 mmol/L (ref 135–145)
Total Bilirubin: 0.4 mg/dL (ref 0.0–1.2)
Total Protein: 6.1 g/dL — ABNORMAL LOW (ref 6.5–8.1)

## 2024-05-09 LAB — PHOSPHORUS: Phosphorus: 2.4 mg/dL — ABNORMAL LOW (ref 2.5–4.6)

## 2024-05-09 LAB — MAGNESIUM: Magnesium: 2 mg/dL (ref 1.7–2.4)

## 2024-05-09 MED ORDER — OXYCODONE HCL 5 MG PO TABS
5.0000 mg | ORAL_TABLET | ORAL | Status: DC | PRN
  Administered 2024-05-09 (×2): 5 mg via ORAL
  Filled 2024-05-09 (×2): qty 1

## 2024-05-09 MED ORDER — SODIUM CHLORIDE 0.9 % IV SOLN
1.0000 g | INTRAVENOUS | Status: DC
  Administered 2024-05-09 – 2024-05-12 (×4): 1 g via INTRAVENOUS
  Filled 2024-05-09 (×5): qty 10

## 2024-05-09 MED ORDER — ACETAMINOPHEN 325 MG PO TABS: 650.0000 mg | ORAL_TABLET | Freq: Four times a day (QID) | ORAL | Status: DC | PRN

## 2024-05-09 MED ORDER — LACTATED RINGERS IV SOLN: INTRAVENOUS | Status: AC

## 2024-05-09 MED ORDER — ONDANSETRON HCL 4 MG PO TABS: 4.0000 mg | ORAL_TABLET | Freq: Four times a day (QID) | ORAL | Status: DC | PRN

## 2024-05-09 MED ORDER — PRAVASTATIN SODIUM 20 MG PO TABS
40.0000 mg | ORAL_TABLET | Freq: Every day | ORAL | Status: DC
  Administered 2024-05-09 – 2024-05-13 (×5): 40 mg via ORAL
  Filled 2024-05-09 (×5): qty 2

## 2024-05-09 MED ORDER — SERTRALINE HCL 50 MG PO TABS
50.0000 mg | ORAL_TABLET | ORAL | Status: DC
  Administered 2024-05-09 – 2024-05-13 (×5): 50 mg via ORAL
  Filled 2024-05-09 (×5): qty 1

## 2024-05-09 MED ORDER — AMLODIPINE BESYLATE 5 MG PO TABS
2.5000 mg | ORAL_TABLET | ORAL | Status: DC
  Filled 2024-05-09 (×2): qty 1

## 2024-05-09 MED ORDER — ALBUTEROL SULFATE (2.5 MG/3ML) 0.083% IN NEBU: 2.5000 mg | INHALATION_SOLUTION | RESPIRATORY_TRACT | Status: DC | PRN

## 2024-05-09 MED ORDER — TAMSULOSIN HCL 0.4 MG PO CAPS
0.4000 mg | ORAL_CAPSULE | Freq: Every day | ORAL | Status: DC
  Administered 2024-05-09 – 2024-05-13 (×5): 0.4 mg via ORAL
  Filled 2024-05-09 (×5): qty 1

## 2024-05-09 MED ORDER — ACETAMINOPHEN 650 MG RE SUPP: 650.0000 mg | Freq: Four times a day (QID) | RECTAL | Status: DC | PRN

## 2024-05-09 MED ORDER — ONDANSETRON HCL 4 MG/2ML IJ SOLN: 4.0000 mg | Freq: Four times a day (QID) | INTRAMUSCULAR | Status: DC | PRN

## 2024-05-09 MED ORDER — ENOXAPARIN SODIUM 60 MG/0.6ML IJ SOSY
0.5000 mg/kg | PREFILLED_SYRINGE | INTRAMUSCULAR | Status: DC
  Administered 2024-05-09 – 2024-05-13 (×5): 45 mg via SUBCUTANEOUS
  Filled 2024-05-09 (×5): qty 0.6

## 2024-05-09 MED ORDER — ORAL CARE MOUTH RINSE: 15.0000 mL | OROMUCOSAL | Status: DC | PRN

## 2024-05-09 MED ORDER — OXYCODONE-ACETAMINOPHEN 5-325 MG PO TABS
1.0000 | ORAL_TABLET | ORAL | Status: DC | PRN
  Administered 2024-05-09 – 2024-05-13 (×7): 1 via ORAL
  Filled 2024-05-09 (×8): qty 1

## 2024-05-09 NOTE — H&P (View-Only) (Signed)
 Urology Consult  I have been asked to see the patient by Dr. Viviann, for evaluation and management of left UPJ stone plus UTI.  Chief Complaint: AMS, falls  History of Present Illness: Brittany Burke is a 78 y.o. year old female with PMH chronic back pain, COPD, CHF, and urge incontinence who presented to the ED yesterday with reports of frequent falls and confusion with concern for sepsis.  CT stone study on presentation notable for an obstructing 5 mm left UPJ stone.  No other urolithiasis.  Admission labs notable for lactate 0.8; creatinine 1.05 (baseline 0.53); white count 9.7; and UA with 21-50 WBC/hpf, 6-10 RBCs/hpf, many bacteria, and calcium oxalate crystals.  She has been afebrile, VSS.  Urine culture pending, on antibiotics as below.  This morning, white count is stable at 9.5 and creatinine is stable at 1.04.  She reports ongoing 10/10 left flank pain.  This is her first stone episode.  She is falling asleep during our visit.  Anti-infectives (From admission, onward)    Start     Dose/Rate Route Frequency Ordered Stop   05/09/24 1000  cefTRIAXone  (ROCEPHIN ) 1 g in sodium chloride  0.9 % 100 mL IVPB        1 g 200 mL/hr over 30 Minutes Intravenous Every 24 hours 05/09/24 0223     05/08/24 2000  cefTRIAXone  (ROCEPHIN ) 1 g in sodium chloride  0.9 % 100 mL IVPB        1 g 200 mL/hr over 30 Minutes Intravenous STAT 05/08/24 1947 05/08/24 2106        Past Medical History:  Diagnosis Date   Anxiety    Arthritis    Benign essential tremor    CHF (congestive heart failure) (HCC)    ? on office note2 /16 pcp   Colon polyp    COPD (chronic obstructive pulmonary disease) (HCC)    DDD (degenerative disc disease), lumbar    Depression    Full dentures    Gait abnormality    HLD (hyperlipidemia)    Hypertension    Insomnia    Leg pain    MRSA nasal colonization 2019   Obesity    Osteoarthritis    Pedal edema    Peripheral neuropathy    Peripheral vascular disease     Pneumonia 05/2014   hx   Postherpetic neuralgia    Pre-diabetes    PVD (peripheral vascular disease)    RBBB (right bundle branch block)    Spinal stenosis, lumbar region with neurogenic claudication    Tobacco use    Urge incontinence    Vitamin D deficiency     Past Surgical History:  Procedure Laterality Date   ABDOMINAL HYSTERECTOMY  1972   BREAST BIOPSY Right 2011   benign   BREAST BIOPSY Left 04/13/2019   Affirm bx-X clip-benign   CATARACT EXTRACTION W/PHACO Right 11/08/2019   Procedure: 7.30  00:51.4;  Surgeon: Jaye Fallow, MD;  Location: [facility];  Service: Ophthalmology;  Laterality: Right;   CATARACT EXTRACTION W/PHACO Left 12/06/2019   Procedure: CATARACT EXTRACTION PHACO AND INTRAOCULAR LENS PLACEMENT (IOC) LEFT DIABETIC 4.62  00:35.1;  Surgeon: Jaye Fallow, MD;  Location: [facility];  Service: Ophthalmology;  Laterality: Left;   COLONOSCOPY WITH PROPOFOL  N/A 04/19/2015   Procedure: COLONOSCOPY WITH PROPOFOL ;  Surgeon: Lamar ONEIDA Holmes, MD;  Location: [facility];  Service: Endoscopy;  Laterality: N/A;   COLONOSCOPY WITH PROPOFOL  N/A 04/22/2019   Procedure: COLONOSCOPY WITH PROPOFOL ;  Surgeon: Dessa Reyes ORN,  MD;  Location: [facility] ENDOSCOPY;  Service: Endoscopy;  Laterality: N/A;   KNEE ARTHROPLASTY Right 03/23/2023   Procedure: COMPUTER ASSISTED TOTAL KNEE ARTHROPLASTY;  Surgeon: Mardee Lynwood SQUIBB, MD;  Location: [facility] OR[facility];  Service: Orthopedics;  Laterality: Right;   LUMBAR LAMINECTOMY/DECOMPRESSION MICRODISCECTOMY N/A 08/23/2014   Procedure: LUMBAR THREE-FOUR, LUMBAR FOUR-FIVE LUMBAR LAMINECTOMY/DECOMPRESSION MICRODISCECTOMY 2 LEVELS;  Surgeon: Reyes Budge, MD;  Location: [facility];  Service: Neurosurgery;  Laterality: N/A;  L34 L45 Laminectomies   TOTAL HIP ARTHROPLASTY Right 09/28/2017   Procedure: TOTAL HIP ARTHROPLASTY;  Surgeon: Mardee Lynwood SQUIBB, MD;  Location: [facility] OR[facility];  Service: Orthopedics;  Laterality: Right;     Home Medications:  No outpatient medications have been marked as taking for the 05/08/24 encounter [facility] Encounter).    Allergies: No Known Allergies  Family History  Problem Relation Age of Onset   Breast cancer Cousin 79   Hypertension Mother    Hypertension Daughter    Diabetes Maternal Grandmother     Social History:  reports that she has been smoking cigarettes. She has a 22 pack-year smoking history. She has never used smokeless tobacco. She reports that she does not drink alcohol  and does not use drugs.  ROS: A complete review of systems was performed.  All systems are negative except for pertinent findings as noted.  Physical Exam:  Vital signs in last 24 hours: Temp:  [97.9 F (36.6 C)-99.4 F (37.4 C)] 99 F (37.2 C) (12/08 0534) Pulse Rate:  [75-86] 83 (12/08 0700) Resp:  [16-20] 17 (12/08 0700) BP: (113-142)/(51-70) 113/51 (12/08 0700) SpO2:  [93 %-100 %] 96 % (12/08 0700) Weight:  [90.7 kg] 90.7 kg (12/07 1334) Constitutional: Drowsy, no acute distress HEENT: [place] AT, moist mucus membranes Cardiovascular: No clubbing, cyanosis, or edema Respiratory: Normal respiratory effort Skin: No rashes, bruises or suspicious lesions Neurologic: Grossly intact, no focal deficits, moving all 4 extremities Psychiatric: Normal mood and affect  Laboratory Data:  Recent Labs    05/08/24 1339 05/09/24 0351  WBC 9.7 9.5  HGB 12.3 10.8*  HCT 37.1 33.2*   Recent Labs    05/08/24 1339 05/09/24 0351  NA 136 138  K 3.4* 3.6  CL 98 101  CO2 21* 19*  GLUCOSE 93 82  BUN 19 21  CREATININE 1.05* 1.04*  CALCIUM  9.4 8.7*   Urinalysis    Component Value Date/Time   COLORURINE AMBER (A) 05/08/2024 1807   APPEARANCEUR CLOUDY (A) 05/08/2024 1807   APPEARANCEUR Clear 09/26/2014 1654   LABSPEC 1.024 05/08/2024 1807   LABSPEC 1.024 09/26/2014 1654   PHURINE 5.0 05/08/2024 1807   GLUCOSEU NEGATIVE 05/08/2024 1807   GLUCOSEU Negative 09/26/2014 1654   HGBUR NEGATIVE  05/08/2024 1807   BILIRUBINUR NEGATIVE 05/08/2024 1807   BILIRUBINUR Negative 09/26/2014 1654   KETONESUR 20 (A) 05/08/2024 1807   PROTEINUR 100 (A) 05/08/2024 1807   NITRITE NEGATIVE 05/08/2024 1807   LEUKOCYTESUR SMALL (A) 05/08/2024 1807   LEUKOCYTESUR Negative 09/26/2014 1654   Results for orders placed or performed during the hospital encounter of 05/08/24  Resp panel by RT-PCR (RSV, Flu A&B, Covid) Anterior Nasal Swab     Status: None   Collection Time: 05/08/24  6:00 PM   Specimen: Anterior Nasal Swab  Result Value Ref Range Status   SARS Coronavirus 2 by RT PCR NEGATIVE NEGATIVE Final    Comment: (NOTE) SARS-CoV-2 target nucleic acids are NOT DETECTED.  The SARS-CoV-2 RNA is generally detectable in upper respiratory specimens during the acute phase of  infection. The lowest concentration of SARS-CoV-2 viral copies this assay can detect is 138 copies/mL. A negative result does not preclude SARS-Cov-2 infection and should not be used as the sole basis for treatment or other patient management decisions. A negative result may occur with  improper specimen collection/handling, submission of specimen other than nasopharyngeal swab, presence of viral mutation(s) within the areas targeted by this assay, and inadequate number of viral copies(<138 copies/mL). A negative result must be combined with clinical observations, patient history, and epidemiological information. The expected result is Negative.  Fact Sheet for Patients:  bloggercourse.com  Fact Sheet for Healthcare Providers:  seriousbroker.it  This test is no t yet approved or cleared by the [place]  FDA and  has been authorized for detection and/or diagnosis of SARS-CoV-2 by FDA under an Emergency Use Authorization (EUA). This EUA will remain  in effect (meaning this test can be used) for the duration of the COVID-19 declaration under Section 564(b)(1) of the Act,  21 U.S.C.section 360bbb-3(b)(1), unless the authorization is terminated  or revoked sooner.       Influenza A by PCR NEGATIVE NEGATIVE Final   Influenza B by PCR NEGATIVE NEGATIVE Final    Comment: (NOTE) The Xpert Xpress SARS-CoV-2/FLU/RSV plus assay is intended as an aid in the diagnosis of influenza from Nasopharyngeal swab specimens and should not be used as a sole basis for treatment. Nasal washings and aspirates are unacceptable for Xpert Xpress SARS-CoV-2/FLU/RSV testing.  Fact Sheet for Patients: bloggercourse.com  Fact Sheet for Healthcare Providers: seriousbroker.it  This test is not yet approved or cleared by the [place]  FDA and has been authorized for detection and/or diagnosis of SARS-CoV-2 by FDA under an Emergency Use Authorization (EUA). This EUA will remain in effect (meaning this test can be used) for the duration of the COVID-19 declaration under Section 564(b)(1) of the Act, 21 U.S.C. section 360bbb-3(b)(1), unless the authorization is terminated or revoked.     Resp Syncytial Virus by PCR NEGATIVE NEGATIVE Final    Comment: (NOTE) Fact Sheet for Patients: bloggercourse.com  Fact Sheet for Healthcare Providers: seriousbroker.it  This test is not yet approved or cleared by the [place]  FDA and has been authorized for detection and/or diagnosis of SARS-CoV-2 by FDA under an Emergency Use Authorization (EUA). This EUA will remain in effect (meaning this test can be used) for the duration of the COVID-19 declaration under Section 564(b)(1) of the Act, 21 U.S.C. section 360bbb-3(b)(1), unless the authorization is terminated or revoked.  Performed at [facility], [address] [place], [place] [address]     Radiologic Imaging: CT Renal Stone Study Result Date: 05/08/2024 EXAM: CT UROGRAM 05/08/2024 09:26:00 PM TECHNIQUE: CT of the  abdomen and pelvis was performed before and after the administration of intravenous contrast as per CT urogram protocol. Multiplanar reformatted images as well as MIP urogram images are provided for review. Automated exposure control, iterative reconstruction, and/or weight based adjustment of the mA/kV was utilized to reduce the radiation dose to as low as reasonably achievable. COMPARISON: None available. CLINICAL HISTORY: Abdominal/flank pain, stone suspected. FINDINGS: LOWER CHEST: Right middle lobe ground-glass airspace opacities (4:1). LIVER: The liver is unremarkable. GALLBLADDER AND BILE DUCTS: Gallbladder is unremarkable. No biliary ductal dilatation. SPLEEN: No acute abnormality. PANCREAS: No acute abnormality. ADRENAL GLANDS: No acute abnormality. KIDNEYS, URETERS AND BLADDER: Left hydronephrosis with associated 5 mm calculus at the left ureteropelvic junction. No right nephrolithiasis. No right hydronephrosis. No ureterolithiasis on the right. No hydroureter bilaterally. A  fluid density lesion of the right kidney likely represents an infrarenal cyst. Simple renal cysts do not require additional follow-up unless clinically indicated due to signs/symptoms. Urinary bladder is unremarkable. No perinephric or periureteral stranding. GI AND BOWEL: Stomach demonstrates no acute abnormality. No small or large bowel thickening or dilatation. The appendix is not definitely identified with no inflammatory changes in the right lower quadrant to suggest acute appendicitis. Colonic diverticulosis. There is no bowel obstruction. PERITONEUM AND RETROPERITONEUM: No ascites. No free air. VASCULATURE: Aorta is normal in caliber. Severe atherosclerotic plaque of the aorta. LYMPH NODES: No lymphadenopathy. REPRODUCTIVE ORGANS: Thinly septated 7.2 x 4.9 cm cystic lesion of the left ovary. BONES AND SOFT TISSUES: Total right hip arthroplasty. Severe degenerative change of the spine. No focal soft tissue abnormality.  IMPRESSION: 1. Obstructive 5 mm left ureteropelvic junction stone. 2. Thinly septated 7.2 x 4.9 cm simple-appearing cyst of the left ovary, recommend follow-up ultrasound in 36 months. 3. Right middle lobe ground-glass airspace opacities. Finding may represent infection/inflammation. Recommend repeat CT at 3 months to evaluate for complete resolution. Electronically signed by: Morgane Naveau MD 05/08/2024 09:35 PM EST RP Workstation: [id number]   CT Cervical Spine Wo Contrast Result Date: 05/08/2024 CLINICAL DATA:  Neck trauma, confusion, multiple falls EXAM: CT CERVICAL SPINE WITHOUT CONTRAST TECHNIQUE: Multidetector CT imaging of the cervical spine was performed without intravenous contrast. Multiplanar CT image reconstructions were also generated. RADIATION DOSE REDUCTION: This exam was performed according to the departmental dose-optimization program which includes automated exposure control, adjustment of the mA and/or kV according to patient size and/or use of iterative reconstruction technique. COMPARISON:  None Available. FINDINGS: Alignment: Alignment is anatomic. Skull base and vertebrae: No acute fracture. No primary bone lesion or focal pathologic process. Soft tissues and spinal canal: No prevertebral fluid or swelling. No visible canal hematoma. Disc levels: Moderate C5-6 and C6-7 spondylosis. Mild diffuse facet hypertrophy greatest from C2-3 through C4-5. Upper chest: Airway is patent.  Lung apices are clear. Other: Reconstructed images demonstrate no additional findings. IMPRESSION: 1. No acute cervical spine fracture. Electronically Signed   By: Ozell Daring M.D.   On: 05/08/2024 14:45   CT Head Wo Contrast Result Date: 05/08/2024 EXAM: CT HEAD WITHOUT CONTRAST 05/08/2024 02:28:00 PM TECHNIQUE: CT of the head was performed without the administration of intravenous contrast. Automated exposure control, iterative reconstruction, and/or weight based adjustment of the mA/kV was utilized to  reduce the radiation dose to as low as reasonably achievable. COMPARISON: CT head without contrast 10/08/2012. CLINICAL HISTORY: Head trauma, GCS=15, no focal neuro findings (low risk) (Ped 0-17y). Increased confusion. FINDINGS: BRAIN AND VENTRICLES: No acute hemorrhage. No evidence of acute infarct. No hydrocephalus. No extra-axial collection. No mass effect or midline shift. Patchy white matter hypodensities, compatible with chronic microvascular ischemic disease. Probable small calcified meningioma in right middle cranial fossa measures 9 mm. ORBITS: Bilateral lens replacements are noted. The globes and orbits are otherwise within normal limits. SINUSES: No acute abnormality. SOFT TISSUES AND SKULL: No acute soft tissue abnormality. No skull fracture. IMPRESSION: 1. No acute intracranial abnormality related to head trauma. 2. Probable small calcified meningioma in the right middle cranial fossa measuring 9 mm. 3. Patchy white matter hypodensities compatible with chronic microvascular ischemic disease. Electronically signed by: Lonni Necessary MD 05/08/2024 02:43 PM EST RP Workstation: [id number]   DG Chest 2 View Result Date: 05/08/2024 CLINICAL DATA:  Chronic back pain, fell, confusion EXAM: CHEST - 2 VIEW COMPARISON:  06/02/2013 FINDINGS: Frontal and lateral views of  the chest demonstrate an unremarkable cardiac silhouette. No acute airspace disease, effusion, or pneumothorax. There are no acute displaced fractures. Severe degenerative changes of the shoulders, left greater than right. IMPRESSION: 1. No acute intrathoracic process. Electronically Signed   By: Ozell Daring M.D.   On: 05/08/2024 14:15   Assessment & Plan:  78 y.o. female with PMH chronic back pain, COPD, CHF, and urge incontinence now admitted with renal colic due to a 5 mm obstructing left UPJ stone with AKI and UTI.  No evidence of sepsis.  Agree with IV antibiotics for management of complicated UTI in the setting of obstructing  stone.  Assuming we can keep her pain reasonably well-controlled, I offered her left ureteroscopy with laser lithotripsy and stent placement versus left stent placement alone this Thursday with Dr. Twylla.  She is in agreement with this plan.  We discussed that if she starts to show signs of sepsis or if her pain becomes uncontrollable, we can proceed with left ureteral stent placement sooner on an urgent basis.  Recommendations: - Antibiotics and pain control per primary team - Follow cultures - Left ureteroscopy with possible laser lithotripsy and stent placement versus stent placement alone this Thursday with Dr. Twylla; please make her n.p.o. at midnight overnight Wednesday/Thursday morning - Please contact urology in the meantime if she develops signs and symptoms of sepsis or if her pain becomes uncontrollable.  Thank you for involving me in this patient's care, I will continue to follow along.  Abdur Hoglund, PA-C 05/09/2024 9:21 AM

## 2024-05-09 NOTE — ED Notes (Signed)
 Pt continues siting in recliner due to back pain being positionally affected.

## 2024-05-09 NOTE — Progress Notes (Signed)
 PROGRESS NOTE Brittany Burke    DOB: [birth year], 78 y.o.  FMW:[id number]    Code Status: Full Code   DOA: 05/08/2024   LOS: 1  Brief hospital course  Brittany Burke is a 78 y.o. female with a PMH significant for  hypertension, hyperlipidemia, depression, COPD who presents to the emergency department due to frequent falls.  Patient states that she fell off her malfunctioning recliner hurting her upper back.  She was reported to have increased confusion today.  Patient denies burning sensation on urination or increased urinary frequency.   ED course: hemodynamically stable.  CBC was normal and BMP was significant for potassium 3.4, bicarb 21, creatinine 1.05 (baseline creatinine at 0.5).  Urinalysis was suspicious for UTI. CT urogram showed obstructive 5 millimeter left ureteropelvic junction stone. Urologist (Dr. Watt) consulted.   Patient was admitted to medicine service for further workup and management of acute cystitis, AKI, nephrolithiasis as outlined in detail below.  05/09/24 -in considerable pain with movement  Assessment & Plan  Principal Problem:   UTI (urinary tract infection)  Acute cystitis Continue IV ceftriaxone  Urine culture pending   Ureterolithiasis- CT renal stone: Obstructive 5 mm left ureteropelvic junction stone. 2. Thinly septated 7.2 x 4.9 cm simple-appearing cyst of the left ovary, recommend follow-up ultrasound in 3-6 months. Continue IV hydration and monitor for passage of stone - urology following, appreciate your care  - infectious treatment continue to optimize prior to procedure tentatively scheduled for Thursday.  - follow up renal imaging in 3-6 months to monitor the cyst - PT/OT   Acute kidney injury- Creatinine 1.05 (baseline creatinine at 0.5). unchanged today Continue gentle hydration Renally adjust medications, avoid nephrotoxic agents/dehydration/hypotension  Elevated liver enzymes- improving. Dehydration?    Obesity class I (BMI  32.28) Diet and lifestyle modification   Chronic back pain Continue oxycodone  as needed   Essential hypertension Continue amlodipine    Mixed hyperlipidemia Continue atorvastatin   COPD (not in acute exacerbation) Continue albuterol  as needed   Depression Continue Zoloft   Body mass index is 32.28 kg/m.  VTE ppx: SCDs Start: 05/09/24 0303  Diet:     Diet   Diet Heart Room service appropriate? Yes; Fluid consistency: Thin   Consultants: Urology   Subjective 05/09/24    Pt reports continued pain of flank when moving which is improved with sitting still. Has no other concerns or questions at this time.    Objective  Blood pressure (!) 113/51, pulse 83, temperature 99 F (37.2 C), resp. rate 17, height 5' 6 (1.676 m), weight 90.7 kg, SpO2 96%. No intake or output data in the 24 hours ending 05/09/24 0818 Filed Weights   05/08/24 1334  Weight: 90.7 kg    Physical Exam:  General: awake, alert, NAD while resting. Significant pain when moving HEENT: atraumatic, clear conjunctiva, anicteric sclera, MMM, hearing grossly normal Respiratory: normal respiratory effort. Cardiovascular: extremities well perfused, quick capillary refill, normal S1/S2, RRR, no JVD, murmurs Gastrointestinal: soft, NT, ND Nervous: A&O x3. no gross focal neurologic deficits, normal speech Extremities: moves all equally, no edema, normal tone Skin: dry, intact, normal temperature, normal color. No rashes, lesions or ulcers on exposed skin  Labs   I have personally reviewed the following labs and imaging studies CBC    Component Value Date/Time   WBC 9.5 05/09/2024 0351   RBC 3.95 05/09/2024 0351   HGB 10.8 (L) 05/09/2024 0351   HGB 13.0 09/26/2014 1436   HCT 33.2 (L) 05/09/2024 0351   HCT  39.4 09/26/2014 1436   PLT 137 (L) 05/09/2024 0351   PLT 283 09/26/2014 1436   MCV 84.1 05/09/2024 0351   MCV 87 09/26/2014 1436   MCH 27.3 05/09/2024 0351   MCHC 32.5 05/09/2024 0351   RDW 15.9 (H)  05/09/2024 0351   RDW 15.0 (H) 09/26/2014 1436   LYMPHSABS 0.4 (L) 05/08/2024 1339   LYMPHSABS 0.4 (L) 08/12/2011 0043   MONOABS 0.5 05/08/2024 1339   MONOABS 0.1 08/12/2011 0043   EOSABS 0.0 05/08/2024 1339   EOSABS 0.0 08/12/2011 0043   BASOSABS 0.0 05/08/2024 1339   BASOSABS 0.0 08/12/2011 0043      Latest Ref Rng & Units 05/09/2024    3:51 AM 05/08/2024    1:39 PM 03/16/2023   10:04 AM  BMP  Glucose 70 - 99 mg/dL 82  93  89   BUN 8 - 23 mg/dL 21  19  15    Creatinine 0.44 - 1.00 mg/dL 8.95  8.94  9.46   Sodium 135 - 145 mmol/L 138  136  139   Potassium 3.5 - 5.1 mmol/L 3.6  3.4  4.0   Chloride 98 - 111 mmol/L 101  98  101   CO2 22 - 32 mmol/L 19  21  28    Calcium 8.9 - 10.3 mg/dL 8.7  9.4  9.2     CT Renal Stone Study Result Date: 05/08/2024 EXAM: CT UROGRAM 05/08/2024 09:26:00 PM TECHNIQUE: CT of the abdomen and pelvis was performed before and after the administration of intravenous contrast as per CT urogram protocol. Multiplanar reformatted images as well as MIP urogram images are provided for review. Automated exposure control, iterative reconstruction, and/or weight based adjustment of the mA/kV was utilized to reduce the radiation dose to as low as reasonably achievable. COMPARISON: None available. CLINICAL HISTORY: Abdominal/flank pain, stone suspected. FINDINGS: LOWER CHEST: Right middle lobe ground-glass airspace opacities (4:1). LIVER: The liver is unremarkable. GALLBLADDER AND BILE DUCTS: Gallbladder is unremarkable. No biliary ductal dilatation. SPLEEN: No acute abnormality. PANCREAS: No acute abnormality. ADRENAL GLANDS: No acute abnormality. KIDNEYS, URETERS AND BLADDER: Left hydronephrosis with associated 5 mm calculus at the left ureteropelvic junction. No right nephrolithiasis. No right hydronephrosis. No ureterolithiasis on the right. No hydroureter bilaterally. A fluid density lesion of the right kidney likely represents an infrarenal cyst. Simple renal cysts do not  require additional follow-up unless clinically indicated due to signs/symptoms. Urinary bladder is unremarkable. No perinephric or periureteral stranding. GI AND BOWEL: Stomach demonstrates no acute abnormality. No small or large bowel thickening or dilatation. The appendix is not definitely identified with no inflammatory changes in the right lower quadrant to suggest acute appendicitis. Colonic diverticulosis. There is no bowel obstruction. PERITONEUM AND RETROPERITONEUM: No ascites. No free air. VASCULATURE: Aorta is normal in caliber. Severe atherosclerotic plaque of the aorta. LYMPH NODES: No lymphadenopathy. REPRODUCTIVE ORGANS: Thinly septated 7.2 x 4.9 cm cystic lesion of the left ovary. BONES AND SOFT TISSUES: Total right hip arthroplasty. Severe degenerative change of the spine. No focal soft tissue abnormality. IMPRESSION: 1. Obstructive 5 mm left ureteropelvic junction stone. 2. Thinly septated 7.2 x 4.9 cm simple-appearing cyst of the left ovary, recommend follow-up ultrasound in 36 months. 3. Right middle lobe ground-glass airspace opacities. Finding may represent infection/inflammation. Recommend repeat CT at 3 months to evaluate for complete resolution. Electronically signed by: Morgane Naveau MD 05/08/2024 09:35 PM EST RP Workstation: [id number]   CT Cervical Spine Wo Contrast Result Date: 05/08/2024 CLINICAL DATA:  Neck trauma, confusion, multiple  falls EXAM: CT CERVICAL SPINE WITHOUT CONTRAST TECHNIQUE: Multidetector CT imaging of the cervical spine was performed without intravenous contrast. Multiplanar CT image reconstructions were also generated. RADIATION DOSE REDUCTION: This exam was performed according to the departmental dose-optimization program which includes automated exposure control, adjustment of the mA and/or kV according to patient size and/or use of iterative reconstruction technique. COMPARISON:  None Available. FINDINGS: Alignment: Alignment is anatomic. Skull base and  vertebrae: No acute fracture. No primary bone lesion or focal pathologic process. Soft tissues and spinal canal: No prevertebral fluid or swelling. No visible canal hematoma. Disc levels: Moderate C5-6 and C6-7 spondylosis. Mild diffuse facet hypertrophy greatest from C2-3 through C4-5. Upper chest: Airway is patent.  Lung apices are clear. Other: Reconstructed images demonstrate no additional findings. IMPRESSION: 1. No acute cervical spine fracture. Electronically Signed   By: Ozell Daring M.D.   On: 05/08/2024 14:45   CT Head Wo Contrast Result Date: 05/08/2024 EXAM: CT HEAD WITHOUT CONTRAST 05/08/2024 02:28:00 PM TECHNIQUE: CT of the head was performed without the administration of intravenous contrast. Automated exposure control, iterative reconstruction, and/or weight based adjustment of the mA/kV was utilized to reduce the radiation dose to as low as reasonably achievable. COMPARISON: CT head without contrast 10/08/2012. CLINICAL HISTORY: Head trauma, GCS=15, no focal neuro findings (low risk) (Ped 0-17y). Increased confusion. FINDINGS: BRAIN AND VENTRICLES: No acute hemorrhage. No evidence of acute infarct. No hydrocephalus. No extra-axial collection. No mass effect or midline shift. Patchy white matter hypodensities, compatible with chronic microvascular ischemic disease. Probable small calcified meningioma in right middle cranial fossa measures 9 mm. ORBITS: Bilateral lens replacements are noted. The globes and orbits are otherwise within normal limits. SINUSES: No acute abnormality. SOFT TISSUES AND SKULL: No acute soft tissue abnormality. No skull fracture. IMPRESSION: 1. No acute intracranial abnormality related to head trauma. 2. Probable small calcified meningioma in the right middle cranial fossa measuring 9 mm. 3. Patchy white matter hypodensities compatible with chronic microvascular ischemic disease. Electronically signed by: Lonni Necessary MD 05/08/2024 02:43 PM EST RP Workstation:  [id number]   DG Chest 2 View Result Date: 05/08/2024 CLINICAL DATA:  Chronic back pain, fell, confusion EXAM: CHEST - 2 VIEW COMPARISON:  06/02/2013 FINDINGS: Frontal and lateral views of the chest demonstrate an unremarkable cardiac silhouette. No acute airspace disease, effusion, or pneumothorax. There are no acute displaced fractures. Severe degenerative changes of the shoulders, left greater than right. IMPRESSION: 1. No acute intrathoracic process. Electronically Signed   By: Ozell Daring M.D.   On: 05/08/2024 14:15    Disposition Plan & Communication  Patient status: Inpatient  Admitted From: Home Planned disposition location: Home Anticipated discharge date: 12/11 pending urology procedure   Family Communication: none at bedside    Author: Marien LITTIE Piety, DO [facility] 05/09/2024, 8:18 AM   Available by Epic secure chat 7AM-7PM. If 7PM-7AM, please contact night-coverage.  [facility] contact information found on [email or web address].

## 2024-05-09 NOTE — Consult Note (Signed)
 Urology Consult  I have been asked to see the patient by Dr. Viviann, for evaluation and management of left UPJ stone plus UTI.  Chief Complaint: AMS, falls  History of Present Illness: Brittany Burke is a 78 y.o. year old female with PMH chronic back pain, COPD, CHF, and urge incontinence who presented to the ED yesterday with reports of frequent falls and confusion with concern for sepsis.  CT stone study on presentation notable for an obstructing 5 mm left UPJ stone.  No other urolithiasis.  Admission labs notable for lactate 0.8; creatinine 1.05 (baseline 0.53); white count 9.7; and UA with 21-50 WBC/hpf, 6-10 RBCs/hpf, many bacteria, and calcium  oxalate crystals.  She has been afebrile, VSS.  Urine culture pending, on antibiotics as below.  This morning, white count is stable at 9.5 and creatinine is stable at 1.04.  She reports ongoing 10/10 left flank pain.  This is her first stone episode.  She is falling asleep during our visit.  Anti-infectives (From admission, onward)    Start     Dose/Rate Route Frequency Ordered Stop   05/09/24 1000  cefTRIAXone  (ROCEPHIN ) 1 g in sodium chloride  0.9 % 100 mL IVPB        1 g 200 mL/hr over 30 Minutes Intravenous Every 24 hours 05/09/24 0223     05/08/24 2000  cefTRIAXone  (ROCEPHIN ) 1 g in sodium chloride  0.9 % 100 mL IVPB        1 g 200 mL/hr over 30 Minutes Intravenous STAT 05/08/24 1947 05/08/24 2106        Past Medical History:  Diagnosis Date   Anxiety    Arthritis    Benign essential tremor    CHF (congestive heart failure) (HCC)    ? on office note2 /16 pcp   Colon polyp    COPD (chronic obstructive pulmonary disease) (HCC)    DDD (degenerative disc disease), lumbar    Depression    Full dentures    Gait abnormality    HLD (hyperlipidemia)    Hypertension    Insomnia    Leg pain    MRSA nasal colonization 2019   Obesity    Osteoarthritis    Pedal edema    Peripheral neuropathy    Peripheral vascular disease     Pneumonia 05/2014   hx   Postherpetic neuralgia    Pre-diabetes    PVD (peripheral vascular disease)    RBBB (right bundle branch block)    Spinal stenosis, lumbar region with neurogenic claudication    Tobacco use    Urge incontinence    Vitamin D deficiency     Past Surgical History:  Procedure Laterality Date   ABDOMINAL HYSTERECTOMY  1972   BREAST BIOPSY Right 2011   benign   BREAST BIOPSY Left 04/13/2019   Affirm bx-X clip-benign   CATARACT EXTRACTION W/PHACO Right 11/08/2019   Procedure: 7.30  00:51.4;  Surgeon: Jaye Fallow, MD;  Location[facility];  Service: Ophthalmology;  Laterality: Right;   CATARACT EXTRACTION W/PHACO Left 12/06/2019   Procedure: CATARACT EXTRACTION PHACO AND INTRAOCULAR LENS PLACEMENT (IOC) LEFT DIABETIC 4.62  00:35.1;  Surgeon: Jaye Fallow, MD;  Location: [facility];  Service: Ophthalmology;  Laterality: Left;   COLONOSCOPY WITH PROPOFOL  N/A 04/19/2015   Procedure: COLONOSCOPY WITH PROPOFOL ;  Surgeon: Lamar ONEIDA Holmes, MD;  Location: [facility];  Service: Endoscopy;  Laterality: N/A;   COLONOSCOPY WITH PROPOFOL  N/A 04/22/2019   Procedure: COLONOSCOPY WITH PROPOFOL ;  Surgeon: Dessa Reyes ORN,  MD;  Location: [facility] END[facility]COPY;  Service: Endoscopy;  Laterality: N/A;   KNEE ARTHROPLASTY Right 03/23/2023   Procedure: COMPUTER ASSISTED TOTAL KNEE ARTHROPLASTY;  Surgeon: Mardee Lynwood SQUIBB, MD;  Location: [facility] OR[facility];  Service: Orthopedics;  Laterality: Right;   LUMBAR LAMINECTOMY/DECOMPRESSION MICRODISCECTOMY N/A 08/23/2014   Procedure: LUMBAR THREE-FOUR, LUMBAR FOUR-FIVE LUMBAR LAMINECTOMY/DECOMPRESSION MICRODISCECTOMY 2 LEVELS;  Surgeon: Reyes Budge, MD;  Location: [facility];  Service: Neurosurgery;  Laterality: N/A;  L34 L45 Laminectomies   TOTAL HIP ARTHROPLASTY Right 09/28/2017   Procedure: TOTAL HIP ARTHROPLASTY;  Surgeon: Mardee Lynwood SQUIBB, MD;  Location: [facility];  Service: Orthopedics;  Laterality: Right;     Home Medications:  No outpatient medications have been marked as taking for the 05/08/24 encounter [facility] Encounter).    Allergies: No Known Allergies  Family History  Problem Relation Age of Onset   Breast cancer Cousin 79   Hypertension Mother    Hypertension Daughter    Diabetes Maternal Grandmother     Social History:  reports that she has been smoking cigarettes. She has a 22 pack-year smoking history. She has never used smokeless tobacco. She reports that she does not drink alcohol  and does not use drugs.  ROS: A complete review of systems was performed.  All systems are negative except for pertinent findings as noted.  Physical Exam:  Vital signs in last 24 hours: Temp:  [97.9 F (36.6 C)-99.4 F (37.4 C)] 99 F (37.2 C) (12/08 0534) Pulse Rate:  [75-86] 83 (12/08 0700) Resp:  [16-20] 17 (12/08 0700) BP: (113-142)/(51-70) 113/51 (12/08 0700) SpO2:  [93 %-100 %] 96 % (12/08 0700) Weight:  [90.7 kg] 90.7 kg (12/07 1334) Constitutional: Drowsy, no acute distress HEENT: [place] AT, moist mucus membranes Cardiovascular: No clubbing, cyanosis, or edema Respiratory: Normal respiratory effort Skin: No rashes, bruises or suspicious lesions Neurologic: Grossly intact, no focal deficits, moving all 4 extremities Psychiatric: Normal mood and affect  Laboratory Data:  Recent Labs    05/08/24 1339 05/09/24 0351  WBC 9.7 9.5  HGB 12.3 10.8*  HCT 37.1 33.2*   Recent Labs    05/08/24 1339 05/09/24 0351  NA 136 138  K 3.4* 3.6  CL 98 101  CO2 21* 19*  GLUCOSE 93 82  BUN 19 21  CREATININE 1.05* 1.04*  CALCIUM  9.4 8.7*   Urinalysis    Component Value Date/Time   COLORURINE AMBER (A) 05/08/2024 1807   APPEARANCEUR CLOUDY (A) 05/08/2024 1807   APPEARANCEUR Clear 09/26/2014 1654   LABSPEC 1.024 05/08/2024 1807   LABSPEC 1.024 09/26/2014 1654   PHURINE 5.0 05/08/2024 1807   GLUCOSEU NEGATIVE 05/08/2024 1807   GLUCOSEU Negative 09/26/2014 1654   HGBUR NEGATIVE  05/08/2024 1807   BILIRUBINUR NEGATIVE 05/08/2024 1807   BILIRUBINUR Negative 09/26/2014 1654   KETONESUR 20 (A) 05/08/2024 1807   PROTEINUR 100 (A) 05/08/2024 1807   NITRITE NEGATIVE 05/08/2024 1807   LEUKOCYTESUR SMALL (A) 05/08/2024 1807   LEUKOCYTESUR Negative 09/26/2014 1654   Results for orders placed or performed during the hospital encounter of 05/08/24  Resp panel by RT-PCR (RSV, Flu A&B, Covid) Anterior Nasal Swab     Status: None   Collection Time: 05/08/24  6:00 PM   Specimen: Anterior Nasal Swab  Result Value Ref Range Status   SARS Coronavirus 2 by RT PCR NEGATIVE NEGATIVE Final    Comment: (NOTE) SARS-CoV-2 target nucleic acids are NOT DETECTED.  The SARS-CoV-2 RNA is generally detectable in upper respiratory specimens during the acute phase of  infection. The lowest concentration of SARS-CoV-2 viral copies this assay can detect is 138 copies/mL. A negative result does not preclude SARS-Cov-2 infection and should not be used as the sole basis for treatment or other patient management decisions. A negative result may occur with  improper specimen collection/handling, submission of specimen other than nasopharyngeal swab, presence of viral mutation(s) within the areas targeted by this assay, and inadequate number of viral copies(<138 copies/mL). A negative result must be combined with clinical observations, patient history, and epidemiological information. The expected result is Negative.  Fact Sheet for Patients:  bloggercourse.com  Fact Sheet for Healthcare Providers:  seriousbroker.it  This test is no t yet approved or cleared by the [place]  FDA and  has been authorized for detection and/or diagnosis of SARS-CoV-2 by FDA under an Emergency Use Authorization (EUA). This EUA will remain  in effect (meaning this test can be used) for the duration of the COVID-19 declaration under Section 564(b)(1) of the Act,  21 U.S.C.section 360bbb-3(b)(1), unless the authorization is terminated  or revoked sooner.       Influenza A by PCR NEGATIVE NEGATIVE Final   Influenza B by PCR NEGATIVE NEGATIVE Final    Comment: (NOTE) The Xpert Xpress SARS-CoV-2/FLU/RSV plus assay is intended as an aid in the diagnosis of influenza from Nasopharyngeal swab specimens and should not be used as a sole basis for treatment. Nasal washings and aspirates are unacceptable for Xpert Xpress SARS-CoV-2/FLU/RSV testing.  Fact Sheet for Patients: bloggercourse.com  Fact Sheet for Healthcare Providers: seriousbroker.it  This test is not yet approved or cleared by the [place]  FDA and has been authorized for detection and/or diagnosis of SARS-CoV-2 by FDA under an Emergency Use Authorization (EUA). This EUA will remain in effect (meaning this test can be used) for the duration of the COVID-19 declaration under Section 564(b)(1) of the Act, 21 U.S.C. section 360bbb-3(b)(1), unless the authorization is terminated or revoked.     Resp Syncytial Virus by PCR NEGATIVE NEGATIVE Final    Comment: (NOTE) Fact Sheet for Patients: bloggercourse.com  Fact Sheet for Healthcare Providers: seriousbroker.it  This test is not yet approved or cleared by the [place]  FDA and has been authorized for detection and/or diagnosis of SARS-CoV-2 by FDA under an Emergency Use Authorization (EUA). This EUA will remain in effect (meaning this test can be used) for the duration of the COVID-19 declaration under Section 564(b)(1) of the Act, 21 U.S.C. section 360bbb-3(b)(1), unless the authorization is terminated or revoked.  Performed at [facility], [address] [place], [place] [address]     Radiologic Imaging: CT Renal Stone Study Result Date: 05/08/2024 EXAM: CT UROGRAM 05/08/2024 09:26:00 PM TECHNIQUE: CT of the  abdomen and pelvis was performed before and after the administration of intravenous contrast as per CT urogram protocol. Multiplanar reformatted images as well as MIP urogram images are provided for review. Automated exposure control, iterative reconstruction, and/or weight based adjustment of the mA/kV was utilized to reduce the radiation dose to as low as reasonably achievable. COMPARISON: None available. CLINICAL HISTORY: Abdominal/flank pain, stone suspected. FINDINGS: LOWER CHEST: Right middle lobe ground-glass airspace opacities (4:1). LIVER: The liver is unremarkable. GALLBLADDER AND BILE DUCTS: Gallbladder is unremarkable. No biliary ductal dilatation. SPLEEN: No acute abnormality. PANCREAS: No acute abnormality. ADRENAL GLANDS: No acute abnormality. KIDNEYS, URETERS AND BLADDER: Left hydronephrosis with associated 5 mm calculus at the left ureteropelvic junction. No right nephrolithiasis. No right hydronephrosis. No ureterolithiasis on the right. No hydroureter bilaterally. A  fluid density lesion of the right kidney likely represents an infrarenal cyst. Simple renal cysts do not require additional follow-up unless clinically indicated due to signs/symptoms. Urinary bladder is unremarkable. No perinephric or periureteral stranding. GI AND BOWEL: Stomach demonstrates no acute abnormality. No small or large bowel thickening or dilatation. The appendix is not definitely identified with no inflammatory changes in the right lower quadrant to suggest acute appendicitis. Colonic diverticulosis. There is no bowel obstruction. PERITONEUM AND RETROPERITONEUM: No ascites. No free air. VASCULATURE: Aorta is normal in caliber. Severe atherosclerotic plaque of the aorta. LYMPH NODES: No lymphadenopathy. REPRODUCTIVE ORGANS: Thinly septated 7.2 x 4.9 cm cystic lesion of the left ovary. BONES AND SOFT TISSUES: Total right hip arthroplasty. Severe degenerative change of the spine. No focal soft tissue abnormality.  IMPRESSION: 1. Obstructive 5 mm left ureteropelvic junction stone. 2. Thinly septated 7.2 x 4.9 cm simple-appearing cyst of the left ovary, recommend follow-up ultrasound in 36 months. 3. Right middle lobe ground-glass airspace opacities. Finding may represent infection/inflammation. Recommend repeat CT at 3 months to evaluate for complete resolution. Electronically signed by: Morgane Naveau MD 05/08/2024 09:35 PM EST RP Workstation: [id number]   CT Cervical Spine Wo Contrast Result Date: 05/08/2024 CLINICAL DATA:  Neck trauma, confusion, multiple falls EXAM: CT CERVICAL SPINE WITHOUT CONTRAST TECHNIQUE: Multidetector CT imaging of the cervical spine was performed without intravenous contrast. Multiplanar CT image reconstructions were also generated. RADIATION DOSE REDUCTION: This exam was performed according to the departmental dose-optimization program which includes automated exposure control, adjustment of the mA and/or kV according to patient size and/or use of iterative reconstruction technique. COMPARISON:  None Available. FINDINGS: Alignment: Alignment is anatomic. Skull base and vertebrae: No acute fracture. No primary bone lesion or focal pathologic process. Soft tissues and spinal canal: No prevertebral fluid or swelling. No visible canal hematoma. Disc levels: Moderate C5-6 and C6-7 spondylosis. Mild diffuse facet hypertrophy greatest from C2-3 through C4-5. Upper chest: Airway is patent.  Lung apices are clear. Other: Reconstructed images demonstrate no additional findings. IMPRESSION: 1. No acute cervical spine fracture. Electronically Signed   By: Ozell Daring M.D.   On: 05/08/2024 14:45   CT Head Wo Contrast Result Date: 05/08/2024 EXAM: CT HEAD WITHOUT CONTRAST 05/08/2024 02:28:00 PM TECHNIQUE: CT of the head was performed without the administration of intravenous contrast. Automated exposure control, iterative reconstruction, and/or weight based adjustment of the mA/kV was utilized to  reduce the radiation dose to as low as reasonably achievable. COMPARISON: CT head without contrast 10/08/2012. CLINICAL HISTORY: Head trauma, GCS=15, no focal neuro findings (low risk) (Ped 0-17y). Increased confusion. FINDINGS: BRAIN AND VENTRICLES: No acute hemorrhage. No evidence of acute infarct. No hydrocephalus. No extra-axial collection. No mass effect or midline shift. Patchy white matter hypodensities, compatible with chronic microvascular ischemic disease. Probable small calcified meningioma in right middle cranial fossa measures 9 mm. ORBITS: Bilateral lens replacements are noted. The globes and orbits are otherwise within normal limits. SINUSES: No acute abnormality. SOFT TISSUES AND SKULL: No acute soft tissue abnormality. No skull fracture. IMPRESSION: 1. No acute intracranial abnormality related to head trauma. 2. Probable small calcified meningioma in the right middle cranial fossa measuring 9 mm. 3. Patchy white matter hypodensities compatible with chronic microvascular ischemic disease. Electronically signed by: Lonni Necessary MD 05/08/2024 02:43 PM EST RP Workstation: [id number]   DG Chest 2 View Result Date: 05/08/2024 CLINICAL DATA:  Chronic back pain, fell, confusion EXAM: CHEST - 2 VIEW COMPARISON:  06/02/2013 FINDINGS: Frontal and lateral views of  the chest demonstrate an unremarkable cardiac silhouette. No acute airspace disease, effusion, or pneumothorax. There are no acute displaced fractures. Severe degenerative changes of the shoulders, left greater than right. IMPRESSION: 1. No acute intrathoracic process. Electronically Signed   By: Ozell Daring M.D.   On: 05/08/2024 14:15   Assessment & Plan:  78 y.o. female with PMH chronic back pain, COPD, CHF, and urge incontinence now admitted with renal colic due to a 5 mm obstructing left UPJ stone with AKI and UTI.  No evidence of sepsis.  Agree with IV antibiotics for management of complicated UTI in the setting of obstructing  stone.  Assuming we can keep her pain reasonably well-controlled, I offered her left ureteroscopy with laser lithotripsy and stent placement versus left stent placement alone this Thursday with Dr. Twylla.  She is in agreement with this plan.  We discussed that if she starts to show signs of sepsis or if her pain becomes uncontrollable, we can proceed with left ureteral stent placement sooner on an urgent basis.  Recommendations: - Antibiotics and pain control per primary team - Follow cultures - Left ureteroscopy with possible laser lithotripsy and stent placement versus stent placement alone this Thursday with Dr. Twylla; please make her n.p.o. at midnight overnight Wednesday/Thursday morning - Please contact urology in the meantime if she develops signs and symptoms of sepsis or if her pain becomes uncontrollable.  Thank you for involving me in this patient's care, I will continue to follow along.  Abdur Hoglund, PA-C 05/09/2024 9:21 AM

## 2024-05-09 NOTE — ED Notes (Signed)
 Transportation home to be provided by [facility]. Contact is Harlene [phone].

## 2024-05-09 NOTE — ED Notes (Signed)
 Pt unable to tolerate laying in bed. Requests to be moved to the recliner. Pt assisted to the recliner. Tray table in front, pt with fall socks on and call bell within reach. Verbalizes understanding to call before trying to get up

## 2024-05-09 NOTE — Evaluation (Signed)
 Occupational Therapy Evaluation Patient Details Name: Brittany Burke MRN: [id number] DOB: 15-Nov-1945 Today's Date: 05/09/2024   History of Present Illness   Brittany Burke is a 78 y.o. female with medical history significant of hypertension, hyperlipidemia, depression, COPD who presents to the emergency department due to frequent falls.  Patient states that she fell off her malfunctioning recliner hurting her upper back.  She was reported to have increased confusion today.  Patient denies burning sensation on urination or increased urinary frequency.     Clinical Impressions Pt up in chair upon OT arrival, just having completed PT eval.  Pt agreeable to OT session.  Pt presents with severe pain levels, impacting UB/LB ADL performance and functional mobility.  See flow sheet below for ADL details.  Pt slightly reclined in chair and did make a good attempt to sit upright in prep for STS, but pain levels were too high to complete.  Per PT, pt was ambulatory to doorway with RW and min A.  Pt previously modified indep with BADLs, and had PCA assist for IADLs, as well as attending PACE program.  Pt intermittently confused, but 0x4.  Inconsistent to answer questions and attend to tasks.  Limited family support and trying to get additional coverage on Fridays from PCA, per PT report.  Pt will benefit from continued skilled OT in the acute setting to maximize indep with ADLs and functional mobility in order to work towards PLOF and reduce risk of rehospitalization.       If plan is discharge home, recommend the following:   A little help with walking and/or transfers;A lot of help with bathing/dressing/bathroom;Assistance with cooking/housework;Supervision due to cognitive status;Assist for transportation;Direct supervision/assist for medications management;Help with stairs or ramp for entrance     Functional Status Assessment   Patient has had a recent decline in their functional status and  demonstrates the ability to make significant improvements in function in a reasonable and predictable amount of time.     Equipment Recommendations   Other (comment) (Defer to next venue of care)     Recommendations for Other Services         Precautions/Restrictions   Precautions Precautions: Fall Restrictions Weight Bearing Restrictions Per Provider Order: No     Mobility Bed Mobility               General bed mobility comments: NT d/t pt received and left sitting up in recliner Patient Response: Cooperative  Transfers                   General transfer comment: NT d/t pt received and left sitting up in recliner      Balance Overall balance assessment: Needs assistance Sitting-balance support: Bilateral upper extremity supported Sitting balance-Leahy Scale: Poor Sitting balance - Comments: Pt required BUE support from arm rests of recliner to move from slightly reclined in chair to attempt at sitting upright, though unable to fully transition to upright position d/t severity of back pain       Standing balance comment: NT                           ADL either performed or assessed with clinical judgement   ADL Overall ADL's : Needs assistance/impaired                     Lower Body Dressing: Maximal assistance Lower Body Dressing Details (indicate cue type and reason): d/t  pain with crossing legs and sitting upright Toilet Transfer: Minimal assistance Toilet Transfer Details (indicate cue type and reason): per mobility assessment from PT, assume at least min A d/t pain, though NT during OT session Toileting- Clothing Manipulation and Hygiene: Total assistance Toileting - Clothing Manipulation Details (indicate cue type and reason): Purewick and pull up in place       General ADL Comments: functional mobility assessed by PT just prior to OT session (min A with RW per PT); pt in chair upon OT arrival and did attempt to sit up  but limited by pain.  Functional mobility NT this date by OT.     Vision Patient Visual Report: No change from baseline                         Pertinent Vitals/Pain Pain Assessment Pain Assessment: 0-10 Pain Score: 10-Worst pain ever Pain Location: back; pt reports it takes my breath away. Pain Descriptors / Indicators: Constant, Sharp, Grimacing Pain Intervention(s): Limited activity within patient's tolerance, Monitored during session, Patient requesting pain meds-RN notified     Extremity/Trunk Assessment Upper Extremity Assessment Upper Extremity Assessment: Generalized weakness;Defer to OT evaluation RUE Deficits / Details: Bilat active shoulder flexion limited to ~80-90* d/t back pain.   Lower Extremity Assessment Lower Extremity Assessment: Generalized weakness   Cervical / Trunk Assessment Cervical / Trunk Assessment: Normal   Communication Communication Communication: Impaired Factors Affecting Communication: Reduced clarity of speech (d/t lethargy/confusion)   Cognition Arousal: Lethargic Behavior During Therapy: WFL for tasks assessed/performed Cognition: Cognition impaired   Orientation impairments: Person, Place, Time, Situation     Attention impairment (select first level of impairment): Focused attention, Alternating attention, Sustained attention, Selective attention, Divided attention   OT - Cognition Comments: High pain levels throughout session impacting attention to tasks. Intermittently confused.                 Following commands: Intact       Cueing  General Comments   Cueing Techniques: Verbal cues  SPo2 > 90% on RA   Exercises           Home Living Family/patient expects to be discharged to:: Private residence Living Arrangements: Alone Available Help at Discharge: Family;Available PRN/intermittently;Personal care attendant (minimal family assistance.) Type of Home: Apartment Home Access: Level entry     Home  Layout: One level     Bathroom Shower/Tub: Chief Strategy Officer: Standard     Home Equipment: BSC/3in1;Rollator (4 wheels);Grab bars - tub/shower;Shower seat          Prior Functioning/Environment Prior Level of Function : Needs assist             Mobility Comments: rollator; attends PACE program and receiving PT/OT services ADLs Comments: modified I with BADLs and PCA assist for IADLs, including housecleaning/chores and running errands    OT Problem List:     OT Treatment/Interventions:        OT Goals(Current goals can be found in the care plan section)   Acute Rehab OT Goals Patient Stated Goal: Improve pain. OT Goal Formulation: With patient Time For Goal Achievement: 05/23/24 Potential to Achieve Goals: Good   OT Frequency:                     AM-PAC OT 6 Clicks Daily Activity     Outcome Measure Help from another person eating meals?: A Little Help from another person taking care  of personal grooming?: A Little Help from another person toileting, which includes using toliet, bedpan, or urinal?: A Lot Help from another person bathing (including washing, rinsing, drying)?: A Lot Help from another person to put on and taking off regular upper body clothing?: A Lot Help from another person to put on and taking off regular lower body clothing?: A Lot 6 Click Score: 14   End of Session Nurse Communication: Patient requests pain meds;Other (comment) (notified RN of pt up in chair)  Activity Tolerance: Patient limited by lethargy;Patient limited by pain Patient left: in chair;with call bell/phone within reach                   Time: 0855-0909 OT Time Calculation (min): 14 min Charges:  OT General Charges $OT Visit: 1 Visit OT Evaluation $OT Eval Moderate Complexity: 1 Mod  Inocente Blazing, MS, OTR/L   Inocente MARLA Blazing 05/09/2024, 9:39 AM

## 2024-05-09 NOTE — Evaluation (Signed)
 Physical Therapy Evaluation Patient Details Name: Brittany Burke MRN: [id number] DOB: September 07, 1945 Today's Date: 05/09/2024  History of Present Illness  Brittany Burke is a 78 y.o. female with medical history significant of hypertension, hyperlipidemia, depression, COPD who presents to the emergency department due to frequent falls.  Patient states that she fell off her malfunctioning recliner hurting her upper back.  She was reported to have increased confusion today.  Patient denies burning sensation on urination or increased urinary frequency.   Clinical Impression  Pt admitted with above diagnosis. Pt currently with functional limitations due to the deficits listed below (see PT Problem List). Pt received upright in recliner agreeable to PT services. Pt alert and oriented to person, place, and situation. Pt very verbose and tangential. Hard to stay on task. Pt reports falling but unable to elaborate how at home leading to admission. Reports PTA living alone being mod-I with 4WW at household level. Requires intermittent assist with ADL's/IADL's via PCA on Mondays and Fridays.   To date, pt with significant LBP per pt limiting eval. Pt requires increased time, bouts of momentum, and minA to stand from recliner to RW. Pt with poor foot clearance bilat and step to pattern and heavy BUE support on RW with chair follow only tolerating 12'. Pt lets go of RW during gait bout leading to Anterior tipping of RW due to IV box needing to be placed on RW leading to pt screaming becoming fearful of falls needing seated rest. Anticipate poor insight into current deficits and author with concerns of living alone with recent fall and current functional capacity without 24/7 supervision. Pt back in recliner for OT hand off. Pt will benefit from skilled PT services <3 hours/day to address acute weakness, falls and pain to maximize return to PLOF.           If plan is discharge home, recommend the following: A little  help with walking and/or transfers;A little help with bathing/dressing/bathroom;Assist for transportation;Help with stairs or ramp for entrance   Can travel by private vehicle   Yes    Equipment Recommendations Other (comment) (TBD by next venue of care)  Recommendations for Other Services       Functional Status Assessment Patient has had a recent decline in their functional status and demonstrates the ability to make significant improvements in function in a reasonable and predictable amount of time.     Precautions / Restrictions Precautions Precautions: Fall Recall of Precautions/Restrictions: Impaired Restrictions Weight Bearing Restrictions Per Provider Order: No      Mobility  Bed Mobility               General bed mobility comments: NT d/t pt received and left sitting up in recliner Patient Response: Cooperative  Transfers Overall transfer level: Needs assistance Equipment used: Rolling walker (2 wheels) Transfers: Sit to/from Stand Sit to Stand: Min assist           General transfer comment: VC's for hand placement.    Ambulation/Gait Ambulation/Gait assistance: Contact guard assist Gait Distance (Feet): 12 Feet Assistive device: Rolling walker (2 wheels) Gait Pattern/deviations: Step-to pattern, Trunk flexed       General Gait Details: requires chair follow. Minimal foot clearance bilaterally and poor endurance due reported severe LBP.  Stairs            Wheelchair Mobility     Tilt Bed Tilt Bed Patient Response: Cooperative  Modified Rankin (Stroke Patients Only)       Balance Overall  balance assessment: Needs assistance                                           Pertinent Vitals/Pain Pain Assessment Pain Assessment: 0-10 Pain Score: 10-Worst pain ever Pain Location: back; pt reports it takes my breath away. Pain Descriptors / Indicators: Constant, Sharp, Grimacing, Crying Pain Intervention(s): Limited  activity within patient's tolerance, Monitored during session, Patient requesting pain meds-RN notified    Home Living Family/patient expects to be discharged to:: Private residence Living Arrangements: Alone Available Help at Discharge: Family;Available PRN/intermittently;Personal care attendant (minimal family assistance.) Type of Home: Apartment Home Access: Level entry       Home Layout: One level Home Equipment: BSC/3in1;Rollator (4 wheels);Grab bars - tub/shower;Shower seat      Prior Function Prior Level of Function : Needs assist             Mobility Comments: rollator; attends PACE program and receiving PT/OT services ADLs Comments: modified I with BADLs and PCA assist for IADLs, including housecleaning/chores and running errands     Extremity/Trunk Assessment   Upper Extremity Assessment Upper Extremity Assessment: Generalized weakness;Defer to OT evaluation RUE Deficits / Details: Bilat active shoulder flexion limited to ~80-90* d/t back pain.    Lower Extremity Assessment Lower Extremity Assessment: Generalized weakness    Cervical / Trunk Assessment Cervical / Trunk Assessment: Normal  Communication   Communication Communication: Impaired Factors Affecting Communication: Reduced clarity of speech (d/t lethargy/confusion)    Cognition Arousal: Alert Behavior During Therapy: WFL for tasks assessed/performed   PT - Cognitive impairments: No apparent impairments                       PT - Cognition Comments: talkative, limited insight into deficits. Following commands: Intact       Cueing Cueing Techniques: Verbal cues     General Comments General comments (skin integrity, edema, etc.): SPo2 > 90% on RA    Exercises Other Exercises Other Exercises: benefits of RW to reduce falls risk, assist in less painful mobility due to severe LBP   Assessment/Plan    PT Assessment Patient needs continued PT services  PT Problem List Decreased  strength;Decreased activity tolerance;Decreased balance       PT Treatment Interventions DME instruction;Neuromuscular re-education;Cognitive remediation;Gait training;Patient/family education;Functional mobility training;Therapeutic activities;Therapeutic exercise;Balance training    PT Goals (Current goals can be found in the Care Plan section)  Acute Rehab PT Goals Patient Stated Goal: to improve her lower back pain PT Goal Formulation: With patient Time For Goal Achievement: 05/23/24 Potential to Achieve Goals: Good    Frequency Min 2X/week     Co-evaluation               AM-PAC PT 6 Clicks Mobility  Outcome Measure Help needed turning from your back to your side while in a flat bed without using bedrails?: A Lot Help needed moving from lying on your back to sitting on the side of a flat bed without using bedrails?: A Lot Help needed moving to and from a bed to a chair (including a wheelchair)?: A Lot Help needed standing up from a chair using your arms (e.g., wheelchair or bedside chair)?: A Lot Help needed to walk in hospital room?: A Lot Help needed climbing 3-5 steps with a railing? : A Lot 6 Click Score: 12    End  of Session Equipment Utilized During Treatment: Gait belt Activity Tolerance: Patient limited by pain Patient left: in chair;with call bell/phone within reach Nurse Communication: Mobility status PT Visit Diagnosis: Muscle weakness (generalized) (M62.81);History of falling (Z91.81);Pain Pain - part of body:  (lower back)    Time: 9168-9145 PT Time Calculation (min) (ACUTE ONLY): 23 min   Charges:   PT Evaluation $PT Eval Moderate Complexity: 1 Mod PT Treatments $Therapeutic Activity: 8-22 mins PT General Charges $$ ACUTE PT VISIT: 1 Visit         Dorina HERO. Fairly IV, PT, DPT Physical Therapist- [place]  [facility] 05/09/2024, 9:38 AM

## 2024-05-09 NOTE — Progress Notes (Signed)
 Anticoagulation monitoring(Lovenox ):  78 yo female ordered Lovenox  40 mg Q24h    Filed Weights   05/08/24 1334  Weight: 90.7 kg (200 lb)   BMI 32.3    Lab Results  Component Value Date   CREATININE 1.05 (H) 05/08/2024   CREATININE 0.53 03/16/2023   CREATININE 0.62 09/16/2017   Estimated Creatinine Clearance: 50.1 mL/min (A) (by C-G formula based on SCr of 1.05 mg/dL (H)). Hemoglobin & Hematocrit     Component Value Date/Time   HGB 12.3 05/08/2024 1339   HGB 13.0 09/26/2014 1436   HCT 37.1 05/08/2024 1339   HCT 39.4 09/26/2014 1436     Per Protocol for Patient with estCrcl > 30 ml/min and BMI > 30, will transition to Lovenox  45 mg Q24h.

## 2024-05-10 LAB — URINE CULTURE

## 2024-05-10 NOTE — Progress Notes (Signed)
 Occupational Therapy Treatment Patient Details Name: Brittany Burke MRN: [id number] DOB: 03/29/1946 Today's Date: 05/10/2024   History of present illness Brittany Burke is a 78 y.o. female with medical history significant of hypertension, hyperlipidemia, depression, COPD who presents to the emergency department due to frequent falls.  Patient states that she fell off her malfunctioning recliner hurting her upper back.  She was reported to have increased confusion today.  Patient denies burning sensation on urination or increased urinary frequency.   OT comments  Patient seen for OT treatment on this date. Upon arrival to room patient resting in bed, reports she just took something to make her sleep but is agreeable to treatment but reports I can't get out of bed, I've been hurting so bad all day and I just got comfortable. OT educated on low level/bed level HEP for UB mobiltiy to improve independence with ADLs and bed mobility (see below for exercises) patient began to fall asleep during therex, OT ended tx as patient not able to continue to participate Patient ended treatment in bed with bed/chair alarm on and all needs within reach. Patient making fair progress toward goals, will continue to follow POC. Discharge recommendation remains appropriate.        If plan is discharge home, recommend the following:  A little help with walking and/or transfers;A lot of help with bathing/dressing/bathroom;Assistance with cooking/housework;Supervision due to cognitive status;Assist for transportation;Direct supervision/assist for medications management;Help with stairs or ramp for entrance   Equipment Recommendations  Other (comment)    Recommendations for Other Services      Precautions / Restrictions Precautions Precautions: Fall Recall of Precautions/Restrictions: Impaired Restrictions Weight Bearing Restrictions Per Provider Order: No       Mobility Bed Mobility                General bed mobility comments: refused    Transfers                   General transfer comment: refused     Balance                                           ADL either performed or assessed with clinical judgement   ADL                                         General ADL Comments: refused all parts of ADLs, likely will need max A due to pain    Extremity/Trunk Assessment Upper Extremity Assessment Upper Extremity Assessment: Generalized weakness   Lower Extremity Assessment Lower Extremity Assessment: Defer to PT evaluation        Vision       Perception     Praxis     Communication Communication Communication: Impaired   Cognition Arousal: Lethargic Behavior During Therapy: WFL for tasks assessed/performed Cognition: History of cognitive impairments   Orientation impairments: Situation                           Following commands: Intact        Cueing   Cueing Techniques: Verbal cues  Exercises Exercises: Other exercises Other Exercises Other Exercises: bed level UB mobilitty: 5 reps of neck flexion/extension, head rotation, shoulder shurgs, shoulder  circles, levator scap/anterior trap stretch x 20 second hold, AAROM horizontal abduction    Shoulder Instructions       General Comments      Pertinent Vitals/ Pain       Pain Assessment Pain Assessment: 0-10 Pain Score: 9  Pain Location: back Pain Descriptors / Indicators: Constant, Sharp, Grimacing, Crying Pain Intervention(s): Premedicated before session, Relaxation  Home Living                                          Prior Functioning/Environment              Frequency  Min 2X/week        Progress Toward Goals  OT Goals(current goals can now be found in the care plan section)  Progress towards OT goals: Not progressing toward goals - comment (limited participation this date)  Acute Rehab OT  Goals Patient Stated Goal: to go home OT Goal Formulation: With patient Time For Goal Achievement: 05/23/24 Potential to Achieve Goals: Good ADL Goals Pt Will Perform Lower Body Dressing: with supervision Pt Will Transfer to Toilet: with supervision;grab bars;regular height toilet Pt Will Perform Toileting - Clothing Manipulation and hygiene: with supervision;sit to/from stand  Plan      Co-evaluation                 AM-PAC OT 6 Clicks Daily Activity     Outcome Measure   Help from another person eating meals?: A Little Help from another person taking care of personal grooming?: A Little Help from another person toileting, which includes using toliet, bedpan, or urinal?: A Lot Help from another person bathing (including washing, rinsing, drying)?: A Lot Help from another person to put on and taking off regular upper body clothing?: A Lot Help from another person to put on and taking off regular lower body clothing?: A Lot 6 Click Score: 14    End of Session    OT Visit Diagnosis: Unsteadiness on feet (R26.81);Other abnormalities of gait and mobility (R26.89);Repeated falls (R29.6);Muscle weakness (generalized) (M62.81);History of falling (Z91.81)   Activity Tolerance Patient limited by pain;Patient limited by lethargy   Patient Left in bed;with call bell/phone within reach;with bed alarm set   Nurse Communication          Time: 8667-8655 OT Time Calculation (min): 12 min  Charges: OT General Charges $OT Visit: 1 Visit OT Treatments $Therapeutic Exercise: 8-22 mins  Rogers Clause, OT/L MSOT, 05/10/2024

## 2024-05-10 NOTE — Progress Notes (Signed)
 PROGRESS NOTE KECIA SWOBODA    DOB: 03-26-1946, 78 y.o.  FMW:[id number]    Code Status: Full Code   DOA: 05/08/2024   LOS: 2  Brief hospital course  Brittany Burke is a 78 y.o. female with a PMH significant for  hypertension, hyperlipidemia, depression, COPD who presents to the emergency department due to frequent falls.    ED course: hemodynamically stable.  CBC was normal and BMP was significant for potassium 3.4, bicarb 21, creatinine 1.05 (baseline creatinine at 0.5).  Urinalysis was suspicious for UTI. CT urogram showed obstructive 5 millimeter left ureteropelvic junction stone. Urologist (Dr. Watt) consulted.   Patient was admitted to medicine service for further workup and management of acute cystitis, AKI, nephrolithiasis as outlined in detail below.  05/10/24 -pain is constant and maybe mildly improved. afebrile  Assessment & Plan  Principal Problem:   UTI (urinary tract infection)  Acute cystitis- Urine culture unfortunately shows multiple species Continue IV ceftriaxone    Ureterolithiasis- CT renal stone: Obstructive 5 mm left ureteropelvic junction stone. 2. Thinly septated 7.2 x 4.9 cm simple-appearing cyst of the left ovary, recommend follow-up ultrasound in 3-6 months. Continue IV hydration and monitor for passage of stone - urology following, appreciate your care  - infectious treatment continue to optimize prior to procedure tentatively scheduled for Thursday.  - follow up renal imaging in 3-6 months to monitor the cyst - PT/OT   Acute kidney injury- Creatinine 1.05 (baseline creatinine at 0.5). unchanged today Continue gentle hydration Renally adjust medications, avoid nephrotoxic agents/dehydration/hypotension - CMP am  Elevated liver enzymes- improving. Dehydration?    Obesity class I (BMI 32.28) Diet and lifestyle modification   Chronic back pain Continue oxycodone  as needed   Essential hypertension Continue amlodipine    Mixed  hyperlipidemia Continue atorvastatin   COPD (not in acute exacerbation) Continue albuterol  as needed   Depression Continue Zoloft   Body mass index is 32.28 kg/m.  VTE ppx: SCDs Start: 05/09/24 0303  Diet:     Diet   Diet NPO time specified Except for: Sips with Meds   Diet regular Room service appropriate? Yes; Fluid consistency: Thin   Consultants: Urology   Subjective 05/10/24    Pt reports continued pain of flank which is mildly improved.    Objective  Blood pressure (!) 113/51, pulse 83, temperature 99 F (37.2 C), resp. rate 17, height 5' 6 (1.676 m), weight 90.7 kg, SpO2 96%.  Intake/Output Summary (Last 24 hours) at 05/10/2024 0804 Last data filed at 05/10/2024 0535 Gross per 24 hour  Intake 100 ml  Output 550 ml  Net -450 ml   Filed Weights   05/08/24 1334  Weight: 90.7 kg    Physical Exam:  General: awake, alert, NAD while resting. Significant pain when moving HEENT: atraumatic, clear conjunctiva, anicteric sclera, MMM, hearing grossly normal Respiratory: normal respiratory effort. Cardiovascular: extremities well perfused, quick capillary refill, normal S1/S2, RRR, no JVD, murmurs Gastrointestinal: soft, NT, ND Nervous: A&O x3. no gross focal neurologic deficits, normal speech Extremities: moves all equally, no edema, normal tone Skin: dry, intact, normal temperature, normal color. No rashes, lesions or ulcers on exposed skin  Labs   I have personally reviewed the following labs and imaging studies CBC    Component Value Date/Time   WBC 9.5 05/09/2024 0351   RBC 3.95 05/09/2024 0351   HGB 10.8 (L) 05/09/2024 0351   HGB 13.0 09/26/2014 1436   HCT 33.2 (L) 05/09/2024 0351   HCT 39.4 09/26/2014 1436  PLT 137 (L) 05/09/2024 0351   PLT 283 09/26/2014 1436   MCV 84.1 05/09/2024 0351   MCV 87 09/26/2014 1436   MCH 27.3 05/09/2024 0351   MCHC 32.5 05/09/2024 0351   RDW 15.9 (H) 05/09/2024 0351   RDW 15.0 (H) 09/26/2014 1436   LYMPHSABS 0.4 (L)  05/08/2024 1339   LYMPHSABS 0.4 (L) 08/12/2011 0043   MONOABS 0.5 05/08/2024 1339   MONOABS 0.1 08/12/2011 0043   EOSABS 0.0 05/08/2024 1339   EOSABS 0.0 08/12/2011 0043   BASOSABS 0.0 05/08/2024 1339   BASOSABS 0.0 08/12/2011 0043      Latest Ref Rng & Units 05/09/2024    3:51 AM 05/08/2024    1:39 PM 03/16/2023   10:04 AM  BMP  Glucose 70 - 99 mg/dL 82  93  89   BUN 8 - 23 mg/dL 21  19  15    Creatinine 0.44 - 1.00 mg/dL 8.95  8.94  9.46   Sodium 135 - 145 mmol/L 138  136  139   Potassium 3.5 - 5.1 mmol/L 3.6  3.4  4.0   Chloride 98 - 111 mmol/L 101  98  101   CO2 22 - 32 mmol/L 19  21  28    Calcium 8.9 - 10.3 mg/dL 8.7  9.4  9.2     CT Renal Stone Study Result Date: 05/08/2024 EXAM: CT UROGRAM 05/08/2024 09:26:00 PM TECHNIQUE: CT of the abdomen and pelvis was performed before and after the administration of intravenous contrast as per CT urogram protocol. Multiplanar reformatted images as well as MIP urogram images are provided for review. Automated exposure control, iterative reconstruction, and/or weight based adjustment of the mA/kV was utilized to reduce the radiation dose to as low as reasonably achievable. COMPARISON: None available. CLINICAL HISTORY: Abdominal/flank pain, stone suspected. FINDINGS: LOWER CHEST: Right middle lobe ground-glass airspace opacities (4:1). LIVER: The liver is unremarkable. GALLBLADDER AND BILE DUCTS: Gallbladder is unremarkable. No biliary ductal dilatation. SPLEEN: No acute abnormality. PANCREAS: No acute abnormality. ADRENAL GLANDS: No acute abnormality. KIDNEYS, URETERS AND BLADDER: Left hydronephrosis with associated 5 mm calculus at the left ureteropelvic junction. No right nephrolithiasis. No right hydronephrosis. No ureterolithiasis on the right. No hydroureter bilaterally. A fluid density lesion of the right kidney likely represents an infrarenal cyst. Simple renal cysts do not require additional follow-up unless clinically indicated due to  signs/symptoms. Urinary bladder is unremarkable. No perinephric or periureteral stranding. GI AND BOWEL: Stomach demonstrates no acute abnormality. No small or large bowel thickening or dilatation. The appendix is not definitely identified with no inflammatory changes in the right lower quadrant to suggest acute appendicitis. Colonic diverticulosis. There is no bowel obstruction. PERITONEUM AND RETROPERITONEUM: No ascites. No free air. VASCULATURE: Aorta is normal in caliber. Severe atherosclerotic plaque of the aorta. LYMPH NODES: No lymphadenopathy. REPRODUCTIVE ORGANS: Thinly septated 7.2 x 4.9 cm cystic lesion of the left ovary. BONES AND SOFT TISSUES: Total right hip arthroplasty. Severe degenerative change of the spine. No focal soft tissue abnormality. IMPRESSION: 1. Obstructive 5 mm left ureteropelvic junction stone. 2. Thinly septated 7.2 x 4.9 cm simple-appearing cyst of the left ovary, recommend follow-up ultrasound in 36 months. 3. Right middle lobe ground-glass airspace opacities. Finding may represent infection/inflammation. Recommend repeat CT at 3 months to evaluate for complete resolution. Electronically signed by: Morgane Naveau MD 05/08/2024 09:35 PM EST RP Workstation: [id number]   CT Cervical Spine Wo Contrast Result Date: 05/08/2024 CLINICAL DATA:  Neck trauma, confusion, multiple falls EXAM: CT CERVICAL SPINE  WITHOUT CONTRAST TECHNIQUE: Multidetector CT imaging of the cervical spine was performed without intravenous contrast. Multiplanar CT image reconstructions were also generated. RADIATION DOSE REDUCTION: This exam was performed according to the departmental dose-optimization program which includes automated exposure control, adjustment of the mA and/or kV according to patient size and/or use of iterative reconstruction technique. COMPARISON:  None Available. FINDINGS: Alignment: Alignment is anatomic. Skull base and vertebrae: No acute fracture. No primary bone lesion or focal  pathologic process. Soft tissues and spinal canal: No prevertebral fluid or swelling. No visible canal hematoma. Disc levels: Moderate C5-6 and C6-7 spondylosis. Mild diffuse facet hypertrophy greatest from C2-3 through C4-5. Upper chest: Airway is patent.  Lung apices are clear. Other: Reconstructed images demonstrate no additional findings. IMPRESSION: 1. No acute cervical spine fracture. Electronically Signed   By: Ozell Daring M.D.   On: 05/08/2024 14:45   CT Head Wo Contrast Result Date: 05/08/2024 EXAM: CT HEAD WITHOUT CONTRAST 05/08/2024 02:28:00 PM TECHNIQUE: CT of the head was performed without the administration of intravenous contrast. Automated exposure control, iterative reconstruction, and/or weight based adjustment of the mA/kV was utilized to reduce the radiation dose to as low as reasonably achievable. COMPARISON: CT head without contrast 10/08/2012. CLINICAL HISTORY: Head trauma, GCS=15, no focal neuro findings (low risk) (Ped 0-17y). Increased confusion. FINDINGS: BRAIN AND VENTRICLES: No acute hemorrhage. No evidence of acute infarct. No hydrocephalus. No extra-axial collection. No mass effect or midline shift. Patchy white matter hypodensities, compatible with chronic microvascular ischemic disease. Probable small calcified meningioma in right middle cranial fossa measures 9 mm. ORBITS: Bilateral lens replacements are noted. The globes and orbits are otherwise within normal limits. SINUSES: No acute abnormality. SOFT TISSUES AND SKULL: No acute soft tissue abnormality. No skull fracture. IMPRESSION: 1. No acute intracranial abnormality related to head trauma. 2. Probable small calcified meningioma in the right middle cranial fossa measuring 9 mm. 3. Patchy white matter hypodensities compatible with chronic microvascular ischemic disease. Electronically signed by: Lonni Necessary MD 05/08/2024 02:43 PM EST RP Workstation: [id number]   DG Chest 2 View Result Date: 05/08/2024 CLINICAL  DATA:  Chronic back pain, fell, confusion EXAM: CHEST - 2 VIEW COMPARISON:  06/02/2013 FINDINGS: Frontal and lateral views of the chest demonstrate an unremarkable cardiac silhouette. No acute airspace disease, effusion, or pneumothorax. There are no acute displaced fractures. Severe degenerative changes of the shoulders, left greater than right. IMPRESSION: 1. No acute intrathoracic process. Electronically Signed   By: Ozell Daring M.D.   On: 05/08/2024 14:15    Disposition Plan & Communication  Patient status: Inpatient  Admitted From: Home Planned disposition location: Home Anticipated discharge date: 12/11 pending urology procedure   Family Communication: none at bedside    Author: Marien LITTIE Piety, DO [facility] 05/10/2024, 8:04 AM   Available by Epic secure chat 7AM-7PM. If 7PM-7AM, please contact night-coverage.  [facility] contact information found on [email or web address].

## 2024-05-10 NOTE — Plan of Care (Signed)
  Problem: Clinical Measurements: Goal: Ability to maintain clinical measurements within normal limits will improve Outcome: Progressing   Problem: Activity: Goal: Risk for activity intolerance will decrease Outcome: Progressing   Problem: Pain Managment: Goal: General experience of comfort will improve and/or be controlled Outcome: Progressing   Problem: Safety: Goal: Ability to remain free from injury will improve Outcome: Progressing    Problem: Urinary Elimination: Goal: Signs and symptoms of infection will decrease Outcome: Progressing

## 2024-05-10 NOTE — Plan of Care (Signed)

## 2024-05-11 LAB — BASIC METABOLIC PANEL WITH GFR
Anion gap: 13 (ref 5–15)
BUN: 15 mg/dL (ref 8–23)
CO2: 24 mmol/L (ref 22–32)
Calcium: 8.4 mg/dL — ABNORMAL LOW (ref 8.9–10.3)
Chloride: 98 mmol/L (ref 98–111)
Creatinine, Ser: 0.89 mg/dL (ref 0.44–1.00)
GFR, Estimated: >60 mL/min (ref >60)
Glucose, Bld: 89 mg/dL (ref 70–99)
Potassium: 3.1 mmol/L — ABNORMAL LOW (ref 3.5–5.1)
Sodium: 134 mmol/L — ABNORMAL LOW (ref 135–145)

## 2024-05-11 LAB — MAGNESIUM: Magnesium: 2 mg/dL (ref 1.7–2.4)

## 2024-05-11 MED ORDER — DULOXETINE HCL 30 MG PO CPEP
120.0000 mg | ORAL_CAPSULE | ORAL | Status: DC
  Administered 2024-05-12 – 2024-05-13 (×2): 120 mg via ORAL
  Filled 2024-05-11 (×2): qty 4

## 2024-05-11 MED ORDER — POTASSIUM CHLORIDE CRYS ER 20 MEQ PO TBCR
40.0000 meq | EXTENDED_RELEASE_TABLET | ORAL | Status: AC
  Administered 2024-05-11 (×2): 40 meq via ORAL
  Filled 2024-05-11 (×2): qty 2

## 2024-05-11 MED ORDER — HYDROMORPHONE HCL 1 MG/ML IJ SOLN
0.5000 mg | INTRAMUSCULAR | Status: DC | PRN
  Administered 2024-05-11 – 2024-05-13 (×11): 0.5 mg via INTRAVENOUS
  Filled 2024-05-11 (×11): qty 0.5

## 2024-05-11 MED ORDER — PREGABALIN 75 MG PO CAPS
150.0000 mg | ORAL_CAPSULE | Freq: Two times a day (BID) | ORAL | Status: DC
  Administered 2024-05-11 – 2024-05-13 (×4): 150 mg via ORAL
  Filled 2024-05-11 (×4): qty 2

## 2024-05-11 MED ORDER — POLYETHYLENE GLYCOL 3350 17 G PO PACK
17.0000 g | PACK | Freq: Every day | ORAL | Status: DC
  Administered 2024-05-11 – 2024-05-13 (×3): 17 g via ORAL
  Filled 2024-05-11 (×3): qty 1

## 2024-05-11 MED ORDER — SENNOSIDES-DOCUSATE SODIUM 8.6-50 MG PO TABS: 1.0000 | ORAL_TABLET | Freq: Every evening | ORAL | Status: DC | PRN

## 2024-05-11 NOTE — Plan of Care (Signed)
  Problem: Education: Goal: Knowledge of General Education information will improve Description: Including pain rating scale, medication(s)/side effects and non-pharmacologic comfort measures Outcome: Progressing   Problem: Clinical Measurements: Goal: Ability to maintain clinical measurements within normal limits will improve Outcome: Progressing Goal: Will remain free from infection Outcome: Progressing Goal: Diagnostic test results will improve Outcome: Progressing Goal: Respiratory complications will improve Outcome: Progressing Goal: Cardiovascular complication will be avoided Outcome: Progressing   Problem: Activity: Goal: Risk for activity intolerance will decrease Outcome: Progressing   Problem: Elimination: Goal: Will not experience complications related to bowel motility Outcome: Progressing Goal: Will not experience complications related to urinary retention Outcome: Progressing   Problem: Pain Managment: Goal: General experience of comfort will improve and/or be controlled Outcome: Progressing   Problem: Safety: Goal: Ability to remain free from injury will improve Outcome: Progressing   Problem: Skin Integrity: Goal: Risk for impaired skin integrity will decrease Outcome: Progressing

## 2024-05-11 NOTE — Progress Notes (Addendum)
 PROGRESS NOTE Brittany Burke    DOB: 03-12-1946, 78 y.o.  FMW:[id number]    Code Status: Full Code   DOA: 05/08/2024   LOS: 3  Brief hospital course  Brittany Burke is a 78 y.o. female with a PMH significant for  hypertension, hyperlipidemia, depression, COPD who presents to the emergency department due to frequent falls.    ED course: hemodynamically stable.  CBC was normal and BMP was significant for potassium 3.4, bicarb 21, creatinine 1.05 (baseline creatinine at 0.5).  Urinalysis was suspicious for UTI. CT urogram showed obstructive 5 millimeter left ureteropelvic junction stone. Urologist (Dr. Watt) consulted.   Patient was admitted to medicine service for further workup and management of acute cystitis, AKI, nephrolithiasis as outlined in detail below.  05/11/24 -pain is constant and maybe mildly improved. afebrile  Assessment & Plan  Principal Problem:   UTI (urinary tract infection)  Acute cystitis- Urine culture unfortunately shows multiple species Continue IV ceftriaxone    Ureterolithiasis- CT renal stone: Obstructive 5 mm left ureteropelvic junction stone. - urology following, appreciate your care - Left ureteroscopy with possible laser lithotripsy and stent placement vs stent placement alone on Thursday 12/11 - NPO PM   Acute kidney injury- Creatinine downtrending Continue gentle hydration Renally adjust medications, avoid nephrotoxic agents/dehydration/hypotension - CMP am  Elevated liver enzymes- improving. Dehydration?  Thrombocytopenia - Monitor platelet count  Hypokalemia - Mag normal - Monitor and replete as needed    Obesity class I (BMI 32.28) Diet and lifestyle modification   Chronic back pain Continue oxycodone  as needed   Essential hypertension Continue amlodipine    Mixed hyperlipidemia Continue atorvastatin   COPD (not in acute exacerbation) Continue albuterol  as needed   Depression Continue Zoloft   Constipation Add bowel  regimen  Incidental findings - Simple appearing cyst of the left ovary, follow-up ultrasound in 3-6 months - Probable small calcified meningioma in the right middle cranial fossa  -- PT/OT eval rec SNF   VTE ppx: SCDs Start: 05/09/24 0303  Diet:     Diet   Diet NPO time specified Except for: Sips with Meds   Diet regular Room service appropriate? Yes; Fluid consistency: Thin   Consultants: Urology   Subjective 05/11/24    Pt reports continued pain of flank which is mildly improved.  Plan for urology intervention tomorrow   Objective  Blood pressure (!) 113/51, pulse 83, temperature 99 F (37.2 C), resp. rate 17, height 5' 6 (1.676 m), weight 90.7 kg, SpO2 96%.  Intake/Output Summary (Last 24 hours) at 05/11/2024 0850 Last data filed at 05/10/2024 1018 Gross per 24 hour  Intake 120 ml  Output --  Net 120 ml   Filed Weights   05/08/24 1334  Weight: 90.7 kg    Physical Exam:  General: awake, alert, NAD while resting. Significant pain when moving HEENT: atraumatic, clear conjunctiva, anicteric sclera, MMM, hearing grossly normal Respiratory: normal respiratory effort. Cardiovascular: extremities well perfused, quick capillary refill, normal S1/S2, RRR, no JVD, murmurs Gastrointestinal: soft, NT, ND Nervous: A&O x3. no gross focal neurologic deficits, normal speech Extremities: moves all equally, no edema, normal tone Skin: dry, intact, normal temperature, normal color. No rashes, lesions or ulcers on exposed skin  Labs   I have personally reviewed the following labs and imaging studies CBC    Component Value Date/Time   WBC 9.5 05/09/2024 0351   RBC 3.95 05/09/2024 0351   HGB 10.8 (L) 05/09/2024 0351   HGB 13.0 09/26/2014 1436   HCT 33.2 (L)  05/09/2024 0351   HCT 39.4 09/26/2014 1436   PLT 137 (L) 05/09/2024 0351   PLT 283 09/26/2014 1436   MCV 84.1 05/09/2024 0351   MCV 87 09/26/2014 1436   MCH 27.3 05/09/2024 0351   MCHC 32.5 05/09/2024 0351   RDW 15.9  (H) 05/09/2024 0351   RDW 15.0 (H) 09/26/2014 1436   LYMPHSABS 0.4 (L) 05/08/2024 1339   LYMPHSABS 0.4 (L) 08/12/2011 0043   MONOABS 0.5 05/08/2024 1339   MONOABS 0.1 08/12/2011 0043   EOSABS 0.0 05/08/2024 1339   EOSABS 0.0 08/12/2011 0043   BASOSABS 0.0 05/08/2024 1339   BASOSABS 0.0 08/12/2011 0043      Latest Ref Rng & Units 05/11/2024    4:17 AM 05/09/2024    3:51 AM 05/08/2024    1:39 PM  BMP  Glucose 70 - 99 mg/dL 89  82  93   BUN 8 - 23 mg/dL 15  21  19    Creatinine 0.44 - 1.00 mg/dL 9.10  8.95  8.94   Sodium 135 - 145 mmol/L 134  138  136   Potassium 3.5 - 5.1 mmol/L 3.1  3.6  3.4   Chloride 98 - 111 mmol/L 98  101  98   CO2 22 - 32 mmol/L 24  19  21    Calcium 8.9 - 10.3 mg/dL 8.4  8.7  9.4     No results found.   Disposition Plan & Communication  Patient status: Inpatient  Admitted From: Home Planned disposition location: Home Anticipated discharge date: 12/11 pending urology procedure   Family Communication: none at bedside    Author: Laree Lock, MD [facility] 05/11/2024, 8:50 AM   Available by Epic secure chat 7AM-7PM. If 7PM-7AM, please contact night-coverage.  [facility] contact information found on [email or web address].

## 2024-05-11 NOTE — TOC Initial Note (Signed)
 Transition of Care [facility]) - Initial/Assessment Note    Patient Details  Name: Brittany Burke MRN: [id number] Date of Birth: 1945/10/28  [facility]) CM/SW Contact:    Corean ONEIDA Haddock, RN Phone Number: 05/11/2024, 11:08 AM  Clinical Narrative:                    Admitted for: Cystitis  Admitted from: home alone.  PACE participant  PCP: Sonny Lindau NP at [facility] [phone]) Current home health/prior home health/DME: RW, tube bench, [facility]  Therapy recommends SNF.  Sonny and patient have decided on a discharge plan.  Patient states that she will return home at discharge.  [facility]ACE will come to the home in the morning and evening, and during the day they will take her to the PACE day program.  Sonny states on day of discharge, they would like to pick patient up and take her to the clinic before returning home       Patient Goals and CMS Choice            Expected Discharge Plan and Services                                              Prior Living Arrangements/Services                       Activities of Daily Living   ADL Screening (condition at time of admission) Independently performs ADLs?: No Does the patient have a NEW difficulty with bathing/dressing/toileting/self-feeding that is expected to last >3 days?: No Does the patient have a NEW difficulty with getting in/out of bed, walking, or climbing stairs that is expected to last >3 days?: No Does the patient have a NEW difficulty with communication that is expected to last >3 days?: No Is the patient deaf or have difficulty hearing?: Yes Does the patient have difficulty seeing, even when wearing glasses/contacts?: Yes Does the patient have difficulty concentrating, remembering, or making decisions?: Yes  Permission Sought/Granted                  Emotional Assessment              Admission diagnosis:  Ureterolithiasis [N20.1] UTI (urinary tract infection)  [N39.0] General weakness [R53.1] AKI (acute kidney injury) [N17.9] Cystitis [N30.90] Patient Active Problem List   Diagnosis Date Noted   UTI (urinary tract infection) 05/08/2024   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   Depression 09/28/2017   Hyperlipemia 09/28/2017   Prediabetes 09/28/2017   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   Chronic obstructive pulmonary disease (HCC) 07/25/2016   Allergic rhinitis 04/18/2016   Benign essential tremor 09/13/2015   Polyneuropathy 09/13/2015   Anxiety disorder, unspecified 10/20/2014   Abnormality of gait and mobility 10/20/2014   Postherpetic neuralgia 10/20/2014   Vitamin D deficiency 10/20/2014   Insomnia 10/20/2014   Nicotine dependence, uncomplicated 10/20/2014   Recurrent major depressive 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 [place] ([address]) [place] [address] Phone: [phone] Fax: [phone]  [facility] [place], [place] - [address] [place] [address] Phone: [phone] Fax: [phone]     Social Drivers of Health (SDOH) Social History: SDOH Screenings   Food Insecurity: Patient Unable To Answer (05/09/2024)  Housing: Unknown (05/09/2024)  Transportation Needs: Patient Unable To Answer (05/09/2024)  Utilities: Patient Unable To Answer (05/09/2024)  Social Connections: Patient Unable To Answer (05/09/2024)  Tobacco Use: High Risk (05/08/2024)   SDOH Interventions:      Readmission Risk Interventions     No data to display

## 2024-05-11 NOTE — Progress Notes (Signed)
 Mobility Specialist - Progress Note   05/11/24 1151  Mobility  Activity Pivoted/transferred to/from [facility]  Level of Assistance Moderate assist, patient does 50-74%  Assistive Device None (+2 HHA)  Distance Ambulated (ft) 2 ft  Activity Response Tolerated well  Mobility visit 1 Mobility  Mobility Specialist Start Time (ACUTE ONLY) 1142  Mobility Specialist Stop Time (ACUTE ONLY) 1150  Mobility Specialist Time Calculation (min) (ACUTE ONLY) 8 min   Pt required Mod-MinA +2 to exit bed and transfer to the [facility] via SPT--- tolerated well. Pt left seated with instructions to utilize the call bell when finished.  Brittany Burke Mobility Specialist 05/11/24 11:54 AM

## 2024-05-11 NOTE — Progress Notes (Signed)
 OT Cancellation Note  Patient Details Name: Brittany Burke MRN: [id number] DOB: 30-Sep-1945   Cancelled Treatment:    Reason Eval/Treat Not Completed: Patient declined, no reason specified;Other (comment) (patient reports continued 9/10 pain, does not want to participate with therapy, very adamant about this. OT to follow up after scheduled sx tomorrow 12/11)  [name] Clause 05/11/2024, 3:16 PM

## 2024-05-11 NOTE — Anesthesia Preprocedure Evaluation (Addendum)
 Anesthesia Evaluation  Patient identified by MRN, date of birth, ID band Patient awake    Reviewed: Allergy & Precautions, H&P , NPO status , Patient's Chart, lab work & pertinent test results, reviewed documented beta blocker date and time   History of Anesthesia Complications Negative for: history of anesthetic complications  Airway Mallampati: III  TM Distance: >3 FB Neck ROM: full    Dental  (+) Edentulous Upper, Upper Dentures, Lower Dentures, Dental Advidsory Given, Edentulous Lower   Pulmonary neg shortness of breath, neg sleep apnea, COPD, neg recent URI, Current Smoker and Patient abstained from smoking.   Pulmonary exam normal breath sounds clear to auscultation       Cardiovascular Exercise Tolerance: Poor hypertension, On Medications (-) angina + Peripheral Vascular Disease  (-) CAD, (-) Past MI, (-) Cardiac Stents and (-) CABG Normal cardiovascular exam+ dysrhythmias (RBBB) (-) Valvular Problems/Murmurs Rhythm:regular Rate:Normal     Neuro/Psych  PSYCHIATRIC DISORDERS (Depression) Anxiety Depression    negative neurological ROS     GI/Hepatic negative GI ROS, Neg liver ROS,,,  Endo/Other  negative endocrine ROSneg diabetes    Renal/GU negative Renal ROS  negative genitourinary   Musculoskeletal   Abdominal  (+) + obese  Peds  Hematology  (+) Blood dyscrasia, anemia thrombocytopenia   Anesthesia Other Findings Past Medical History:   Hypertension                                                 CHF (congestive heart failure) (HCC)                           Comment:? on office note2 /16 pcp   Pneumonia                                       12/15          Comment:hx   Depression                                                   Arthritis                                                    Reproductive/Obstetrics negative OB ROS                              Anesthesia  Physical Anesthesia Plan  ASA: 3  Anesthesia Plan: General   Post-op Pain Management: Toradol  IV (intra-op)* and Ofirmev  IV (intra-op)*   Induction: Intravenous  PONV Risk Score and Plan: 1  Airway Management Planned: LMA  Additional Equipment:   Intra-op Plan:   Post-operative Plan: Extubation in OR  Informed Consent: I have reviewed the patients History and Physical, chart, labs and discussed the procedure including the risks, benefits and alternatives for the proposed anesthesia with the patient or authorized representative who has indicated his/her understanding and acceptance.  Dental Advisory Given  Plan Discussed with: Anesthesiologist, CRNA and Surgeon  Anesthesia Plan Comments:          Anesthesia Quick Evaluation

## 2024-05-12 ENCOUNTER — Inpatient Hospital Stay: Admitting: Anesthesiology

## 2024-05-12 ENCOUNTER — Inpatient Hospital Stay

## 2024-05-12 ENCOUNTER — Encounter: Admission: EM | Disposition: A | Discharge status: Skilled Nursing Facility | Payer: Self-pay | Source: Home / Self Care

## 2024-05-12 DIAGNOSIS — N200 Calculus of kidney: Secondary | ICD-10-CM | POA: Diagnosis not present

## 2024-05-12 HISTORY — PX: CYSTOSCOPY/URETEROSCOPY/HOLMIUM LASER/STENT PLACEMENT: SHX6546

## 2024-05-12 LAB — CBC
HCT: 32 % — ABNORMAL LOW (ref 36.0–46.0)
Hemoglobin: 10.8 g/dL — ABNORMAL LOW (ref 12.0–15.0)
MCH: 27.5 pg (ref 26.0–34.0)
MCHC: 33.8 g/dL (ref 30.0–36.0)
MCV: 81.4 fL (ref 80.0–100.0)
Platelets: 201 K/uL (ref 150–400)
RBC: 3.93 MIL/uL (ref 3.87–5.11)
RDW: 16.5 % — ABNORMAL HIGH (ref 11.5–15.5)
WBC: 7.7 K/uL (ref 4.0–10.5)
nRBC: 0 % (ref 0.0–0.2)

## 2024-05-12 LAB — COMPREHENSIVE METABOLIC PANEL WITH GFR
ALT: 35 U/L (ref 0–44)
AST: 33 U/L (ref 15–41)
Albumin: 2.8 g/dL — ABNORMAL LOW (ref 3.5–5.0)
Alkaline Phosphatase: 133 U/L — ABNORMAL HIGH (ref 38–126)
Anion gap: 14 (ref 5–15)
BUN: 11 mg/dL (ref 8–23)
CO2: 22 mmol/L (ref 22–32)
Calcium: 8.7 mg/dL — ABNORMAL LOW (ref 8.9–10.3)
Chloride: 101 mmol/L (ref 98–111)
Creatinine, Ser: 0.7 mg/dL (ref 0.44–1.00)
GFR, Estimated: >60 mL/min (ref >60)
Glucose, Bld: 92 mg/dL (ref 70–99)
Potassium: 4.1 mmol/L (ref 3.5–5.1)
Sodium: 137 mmol/L (ref 135–145)
Total Bilirubin: 0.4 mg/dL (ref 0.0–1.2)
Total Protein: 6 g/dL — ABNORMAL LOW (ref 6.5–8.1)

## 2024-05-12 LAB — MAGNESIUM: Magnesium: 2.1 mg/dL (ref 1.7–2.4)

## 2024-05-12 SURGERY — CYSTOSCOPY/URETEROSCOPY/HOLMIUM LASER/STENT PLACEMENT
Anesthesia: General | Site: Ureter | Laterality: Left

## 2024-05-12 MED ORDER — DEXAMETHASONE SOD PHOSPHATE PF 10 MG/ML IJ SOLN
INTRAMUSCULAR | Status: DC | PRN
  Administered 2024-05-12: 5 mg via INTRAVENOUS

## 2024-05-12 MED ORDER — ACETAMINOPHEN 10 MG/ML IV SOLN: 1000.0000 mg | Freq: Once | INTRAVENOUS | Status: DC | PRN

## 2024-05-12 MED ORDER — LIDOCAINE HCL (PF) 2 % IJ SOLN
INTRAMUSCULAR | Status: AC
  Filled 2024-05-12: qty 5

## 2024-05-12 MED ORDER — PANTOPRAZOLE SODIUM 40 MG PO TBEC
40.0000 mg | DELAYED_RELEASE_TABLET | Freq: Every day | ORAL | Status: DC
  Administered 2024-05-12 – 2024-05-13 (×2): 40 mg via ORAL
  Filled 2024-05-12 (×2): qty 1

## 2024-05-12 MED ORDER — FENTANYL CITRATE (PF) 100 MCG/2ML IJ SOLN
INTRAMUSCULAR | Status: AC
  Filled 2024-05-12: qty 2

## 2024-05-12 MED ORDER — OXYCODONE HCL 5 MG PO TABS: 5.0000 mg | ORAL_TABLET | Freq: Once | ORAL | Status: DC | PRN

## 2024-05-12 MED ORDER — ACETAMINOPHEN 10 MG/ML IV SOLN
INTRAVENOUS | Status: DC | PRN
  Administered 2024-05-12: 1000 mg via INTRAVENOUS

## 2024-05-12 MED ORDER — SODIUM CHLORIDE 0.9 % IR SOLN
Status: DC | PRN
  Administered 2024-05-12: 3000 mL

## 2024-05-12 MED ORDER — PHENYLEPHRINE 80 MCG/ML (10ML) SYRINGE FOR IV PUSH (FOR BLOOD PRESSURE SUPPORT)
PREFILLED_SYRINGE | INTRAVENOUS | Status: AC
  Filled 2024-05-12: qty 10

## 2024-05-12 MED ORDER — CALCIUM CARBONATE ANTACID 500 MG PO CHEW
1.0000 | CHEWABLE_TABLET | Freq: Every day | ORAL | Status: DC | PRN
  Administered 2024-05-12: 200 mg via ORAL
  Filled 2024-05-12: qty 1

## 2024-05-12 MED ORDER — IOHEXOL 180 MG/ML  SOLN
INTRAMUSCULAR | Status: DC | PRN
  Administered 2024-05-12 (×2): 10 mL

## 2024-05-12 MED ORDER — PROPOFOL 10 MG/ML IV BOLUS
INTRAVENOUS | Status: AC
  Filled 2024-05-12: qty 20

## 2024-05-12 MED ORDER — ONDANSETRON HCL 4 MG/2ML IJ SOLN
INTRAMUSCULAR | Status: AC
  Filled 2024-05-12: qty 2

## 2024-05-12 MED ORDER — EPHEDRINE 5 MG/ML INJ
INTRAVENOUS | Status: AC
  Filled 2024-05-12: qty 5

## 2024-05-12 MED ORDER — OXYCODONE HCL 5 MG/5ML PO SOLN: 5.0000 mg | Freq: Once | ORAL | Status: DC | PRN

## 2024-05-12 MED ORDER — FENTANYL CITRATE (PF) 100 MCG/2ML IJ SOLN: 25.0000 ug | INTRAMUSCULAR | Status: DC | PRN

## 2024-05-12 MED ORDER — LACTATED RINGERS IV SOLN: INTRAVENOUS | Status: DC

## 2024-05-12 MED ORDER — PHENYLEPHRINE 80 MCG/ML (10ML) SYRINGE FOR IV PUSH (FOR BLOOD PRESSURE SUPPORT)
PREFILLED_SYRINGE | INTRAVENOUS | Status: DC | PRN
  Administered 2024-05-12 (×2): 160 ug via INTRAVENOUS
  Administered 2024-05-12: 240 ug via INTRAVENOUS
  Administered 2024-05-12: 80 ug via INTRAVENOUS

## 2024-05-12 MED ORDER — STERILE WATER FOR IRRIGATION IR SOLN
Status: DC | PRN
  Administered 2024-05-12: 500 mL

## 2024-05-12 MED ORDER — PROPOFOL 10 MG/ML IV BOLUS
INTRAVENOUS | Status: DC | PRN
  Administered 2024-05-12: 100 mg via INTRAVENOUS

## 2024-05-12 MED ORDER — EPHEDRINE SULFATE-NACL 50-0.9 MG/10ML-% IV SOSY
PREFILLED_SYRINGE | INTRAVENOUS | Status: DC | PRN
  Administered 2024-05-12 (×4): 5 mg via INTRAVENOUS

## 2024-05-12 MED ORDER — DROPERIDOL 2.5 MG/ML IJ SOLN: 0.6250 mg | Freq: Once | INTRAMUSCULAR | Status: DC | PRN

## 2024-05-12 MED ORDER — FENTANYL CITRATE (PF) 100 MCG/2ML IJ SOLN
INTRAMUSCULAR | Status: DC | PRN
  Administered 2024-05-12 (×2): 50 ug via INTRAVENOUS

## 2024-05-12 MED ORDER — LIDOCAINE HCL (CARDIAC) PF 100 MG/5ML IV SOSY
PREFILLED_SYRINGE | INTRAVENOUS | Status: DC | PRN
  Administered 2024-05-12: 100 mg via INTRAVENOUS

## 2024-05-12 MED ORDER — ACETAMINOPHEN 10 MG/ML IV SOLN
INTRAVENOUS | Status: AC
  Filled 2024-05-12: qty 100

## 2024-05-12 MED ORDER — ONDANSETRON HCL 4 MG/2ML IJ SOLN
INTRAMUSCULAR | Status: DC | PRN
  Administered 2024-05-12: 4 mg via INTRAVENOUS

## 2024-05-12 SURGICAL SUPPLY — 23 items
BAG DRAIN SIEMENS DORNER NS (MISCELLANEOUS) ×1 IMPLANT
BASKET ZERO TIP 1.9FR (BASKET) IMPLANT
BRUSH SCRUB EZ 4% CHG (MISCELLANEOUS) ×1 IMPLANT
CATH URET FLEX-TIP 2 LUMEN 10F (CATHETERS) IMPLANT
CATH URETL OPEN END 6X70 (CATHETERS) IMPLANT
DRAPE UTILITY 15X26 TOWEL STRL (DRAPES) ×1 IMPLANT
FIBER LASER MOSES 200 DFL (Laser) IMPLANT
GLOVE BIOGEL PI IND STRL 7.5 (GLOVE) ×1 IMPLANT
GOWN STRL REUS W/ TWL LRG LVL3 (GOWN DISPOSABLE) ×1 IMPLANT
GOWN STRL REUS W/ TWL XL LVL3 (GOWN DISPOSABLE) ×1 IMPLANT
GUIDEWIRE GREEN .038 145CM (MISCELLANEOUS) IMPLANT
GUIDEWIRE STR DUAL SENSOR (WIRE) ×1 IMPLANT
KIT TURNOVER CYSTO (KITS) ×1 IMPLANT
PACK CYSTO AR (MISCELLANEOUS) ×1 IMPLANT
SHEATH NAVIGATOR HD 12/14X36 (SHEATH) IMPLANT
SOL .9 NS 3000ML IRR UROMATIC (IV SOLUTION) ×1 IMPLANT
SOLN STERILE WATER 500 ML (IV SOLUTION) ×1 IMPLANT
STENT URET 6FRX24 CONTOUR (STENTS) IMPLANT
STENT URET 6FRX26 CONTOUR (STENTS) IMPLANT
SURGILUBE 2OZ TUBE FLIPTOP (MISCELLANEOUS) ×1 IMPLANT
SYR 20ML LL LF (SYRINGE) IMPLANT
TUBING THERMACLEAR UROLOGY (TUBING) ×1 IMPLANT
VALVE UROSEAL ADJ ENDO (VALVE) IMPLANT

## 2024-05-12 NOTE — Anesthesia Postprocedure Evaluation (Signed)
 Anesthesia Post Note  Patient: Brittany Burke  Procedure(s) Performed: CYSTOSCOPY/URETEROSCOPY/HOLMIUM LASER/STENT PLACEMENT (Left: Ureter)  Patient location during evaluation: PACU Anesthesia Type: General Level of consciousness: awake and alert Pain management: pain level controlled Vital Signs Assessment: post-procedure vital signs reviewed and stable Respiratory status: spontaneous breathing, nonlabored ventilation and respiratory function stable Cardiovascular status: blood pressure returned to baseline and stable Postop Assessment: no apparent nausea or vomiting Anesthetic complications: no   No notable events documented.   Last Vitals:  Vitals:   05/12/24 1400 05/12/24 1446  BP: 123/71 132/68  Pulse: 77 74  Resp: 16 16  Temp: 36.9 C   SpO2: 96% 95%    Last Pain:  Vitals:   05/12/24 1400  TempSrc:   PainSc: 0-No pain                 Camellia Merilee Louder

## 2024-05-12 NOTE — Progress Notes (Signed)
 Patient gone down for her procedure at this time. No distress.

## 2024-05-12 NOTE — Progress Notes (Signed)
 PROGRESS NOTE Brittany MCCLARY    DOB: 08-Jan-1946, 78 y.o.  FMW:[id number]    Code Status: Full Code   DOA: 05/08/2024   LOS: 4  Brief hospital course  Brittany Burke is a 78 y.o. female with a PMH significant for  hypertension, hyperlipidemia, depression, COPD who presents to the emergency department due to frequent falls.    ED course: hemodynamically stable.  CBC was normal and BMP was significant for potassium 3.4, bicarb 21, creatinine 1.05 (baseline creatinine at 0.5).  Urinalysis was suspicious for UTI. CT urogram showed obstructive 5 millimeter left ureteropelvic junction stone. Urologist (Dr. Watt) consulted.   Patient was admitted to medicine service for further workup and management of acute cystitis, AKI, nephrolithiasis as outlined in detail below.  05/12/2024 -underwent cystoscopy/ureteroscopy/ Holmium laser and stent placement on the left side  Assessment & Plan  Principal Problem:   UTI (urinary tract infection)  Acute cystitis- Urine culture unfortunately shows multiple species Continue IV ceftriaxone    Ureterolithiasis- CT renal stone: Obstructive 5 mm left ureteropelvic junction stone. - urology following, appreciate your care - 12/11 - underwent cystoscopy, Left ureteroscopy with lithotripsy and stent placement on the left side 12/11   Acute kidney injury- resolved S/p IV fluids  Elevated liver enzymes- improving. Dehydration?  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  Underwent urology procedure   Objective  Blood pressure (!) 113/51, pulse 83, temperature 99 F (37.2 C), resp. rate 17, height 5' 6 (1.676 m), weight 90.7 kg, SpO2 96%.  Intake/Output Summary (Last 24 hours) at 05/12/2024 0828 Last data filed at 05/12/2024 0543 Gross per 24 hour  Intake --  Output 1150 ml  Net -1150 ml   Filed Weights   05/08/24 1334  Weight: 90.7 kg    Physical Exam:  General: awake, alert, NAD while resting. Significant pain when moving HEENT: atraumatic, clear conjunctiva, anicteric sclera, MMM, hearing grossly normal Respiratory: normal respiratory effort. Cardiovascular: extremities well perfused, quick capillary refill, normal S1/S2, RRR, no JVD, murmurs Gastrointestinal: soft, NT, ND Nervous: A&O x3. no gross focal neurologic deficits, normal speech Extremities: moves all equally, no edema, normal tone Skin: dry, intact, normal temperature, normal color. No rashes, lesions or ulcers on exposed skin  Labs   I have personally reviewed the following labs and imaging studies CBC    Component Value Date/Time   WBC 7.7 05/12/2024 0440   RBC 3.93 05/12/2024 0440   HGB 10.8 (L) 05/12/2024 0440   HGB 13.0 09/26/2014 1436   HCT 32.0 (L) 05/12/2024 0440   HCT 39.4 09/26/2014 1436   PLT 201 05/12/2024 0440   PLT 283 09/26/2014 1436   MCV 81.4 05/12/2024 0440  MCV 87 09/26/2014 1436   MCH 27.5 05/12/2024 0440   MCHC 33.8 05/12/2024 0440   RDW 16.5 (H) 05/12/2024 0440   RDW 15.0 (H) 09/26/2014 1436   LYMPHSABS 0.4 (L) 05/08/2024 1339   LYMPHSABS 0.4 (L) 08/12/2011 0043   MONOABS 0.5 05/08/2024 1339   MONOABS 0.1 08/12/2011 0043   EOSABS 0.0 05/08/2024 1339    EOSABS 0.0 08/12/2011 0043   BASOSABS 0.0 05/08/2024 1339   BASOSABS 0.0 08/12/2011 0043      Latest Ref Rng & Units 05/12/2024    4:40 AM 05/11/2024    4:17 AM 05/09/2024    3:51 AM  BMP  Glucose 70 - 99 mg/dL 92  89  82   BUN 8 - 23 mg/dL 11  15  21    Creatinine 0.44 - 1.00 mg/dL 9.29  9.10  8.95   Sodium 135 - 145 mmol/L 137  134  138   Potassium 3.5 - 5.1 mmol/L 4.1  3.1  3.6   Chloride 98 - 111 mmol/L 101  98  101   CO2 22 - 32 mmol/L 22  24  19    Calcium 8.9 - 10.3 mg/dL 8.7  8.4  8.7     No results found.   Disposition Plan & Communication  Patient status: Inpatient  Admitted From: Home Planned disposition location: Home Anticipated discharge date: 12/12   Family Communication: none at bedside    Author: Laree Lock, MD [facility] 05/12/2024, 8:28 AM   Available by Epic secure chat 7AM-7PM. If 7PM-7AM, please contact night-coverage.  [facility] contact information found on [email or web address].

## 2024-05-12 NOTE — Op Note (Signed)
 Preoperative diagnosis:  Left nephrolithiasis   Postoperative diagnosis:  Left nephrolithiasis  Procedure:  Cystoscopy Left ureteroscopy and stone removal Ureteroscopic laser lithotripsy Left ureteral stent placement (67F/24 cm)  Left retrograde pyelography with interpretation  Surgeon: Glendia C. Reuben Knoblock, M.D.  Anesthesia: General  Complications: None  Intraoperative findings:  Cystoscopy: Bladder mucosa without solid or papillary lesions.  Ureteropyeloscopy: Calculus noted in the renal pelvis.  2 old appearing blood clots which were removed with a 1.9 F nitinol basket Left retrograde pyelography post procedure showed no filling defects, stone fragments or contrast extravasation  EBL: Minimal  Specimens: Calculus fragments for analysis   Indication: Brittany Burke is a 78 y.o. female admitted with left renal colic secondary to a 5 mm UPJ calculus and a possible UTI.  Urine culture subsequently grew multiple species and she has been afebrile during her hospitalization.  After reviewing the management options for treatment, the patient elected to proceed with the above surgical procedure(s). We have discussed the potential benefits and risks of the procedure, side effects of the proposed treatment, the likelihood of the patient achieving the goals of the procedure, and any potential problems that might occur during the procedure or recuperation. Informed consent has been obtained.  Description of procedure:  The patient was taken to the operating room and general anesthesia was induced.  The patient was placed in the dorsal lithotomy position, prepped and draped in the usual sterile fashion, and preoperative antibiotics were administered. A preoperative time-out was performed.   A 21 Fr cystoscope was lubricated, placed per urethra.  Panendoscopy was performed with findings described above.    Attention was directed to the left ureteral orifice and a 0.038 Sensor wire was then  advanced up the  ureter into the renal pelvis under fluoroscopic guidance.  The cystoscope was removed and a dual-lumen catheter was placed over the sensor wire and a second sensor wire was placed in a similar fashion.  A single channel digital flexible ureteroscope was placed over the working wire and was advanced into the ureter without difficulty.  Once in the proximal ureter the guidewire is removed and the ureteroscope was advanced into the renal pelvis without difficulty.  The stone had been dislodged from the UPJ with a guidewire and was identified in the renal pelvis.  Contrast was instilled through the ureteroscope and all calyces were examined.  2 old blood clots were identified and lower pole calyces.  They were able to be placed in a 1.9 left nitinol basket and deposited in the lower proximal ureter.  No other urinary calculi were identified.  The 5 mm calculus was placed and 1.9 French nitinol basket and deposited in a midpole calyx.  A 200 m Moses holmium laser fiber was placed through the ureteroscope and the stone was dusted at a setting of 0.3 J/80 hz.  Once dusting was completed no fragments larger than the tip of the laser fiber were identified.  Retrograde pyelogram was performed and each calyx was sequentially examined under fluoroscopic guidance and no significant size fragments were identified.  The ureteroscope was removed under direct vision.  No ureteral stone fragments were identified.  A 6 F/24 cm Contour ureteral stent was placed under fluoroscopic guidance.  The wire was then removed with an adequate stent curl noted in the renal pelvis as well as in the bladder.  The bladder was then emptied and the procedure ended.  The patient appeared to tolerate the procedure well and without complications.  After  anesthetic reversal the patient was transported to the PACU in stable condition.   Plan: She will be scheduled for stent removal in office 7 to 10 days   Glendia Barba,  MD

## 2024-05-12 NOTE — Anesthesia Procedure Notes (Signed)
 Procedure Name: LMA Insertion Date/Time: 05/12/2024 12:09 PM  Performed by: Jackye Spanner, CRNAPre-anesthesia Checklist: Patient identified, Patient being monitored, Timeout performed, Emergency Drugs available and Suction available Patient Re-evaluated:Patient Re-evaluated prior to induction Oxygen Delivery Method: Circle system utilized Preoxygenation: Pre-oxygenation with 100% oxygen Induction Type: IV induction Ventilation: Mask ventilation without difficulty LMA: LMA inserted LMA Size: 4.0 Tube type: Oral Number of attempts: 1 Placement Confirmation: positive ETCO2 and breath sounds checked- equal and bilateral Tube secured with: Tape Dental Injury: Teeth and Oropharynx as per pre-operative assessment  Comments: Smooth atraumatic Igel LMA placement, no complications noted.

## 2024-05-12 NOTE — Interval H&P Note (Signed)
 History and Physical Interval Note:  05/12/2024 11:48 AM  Brittany Burke  has presented today for surgery, with the diagnosis of left ureteral calculus.  The various methods of treatment have been discussed with the patient and family. After consideration of risks, benefits and other options for treatment, the patient has consented to  Procedures: CYSTOSCOPY/URETEROSCOPY/HOLMIUM LASER/STENT PLACEMENT (Left) as a surgical intervention.  We discussed potential risk including bleeding, infection/sepsis and ureteral injury.  In a small percentage of cases we discussed the stone cannot be treated due to inability to access the collecting system with the ureteroscope.  If this were to occur a stent would be placed and she would need follow-up ureteroscopy.  The patient's history has been reviewed, patient examined, no change in status, stable for surgery.  I have reviewed the patient's chart and labs.  Questions were answered to the patient's satisfaction.    CV:RRR Lungs: Clear  Avriel Kandel C Antoine Fiallos

## 2024-05-12 NOTE — Plan of Care (Signed)

## 2024-05-12 NOTE — Progress Notes (Signed)
 PT Cancellation Note  Patient Details Name: Brittany Burke MRN: [id number] DOB: 1945-11-26   Cancelled Treatment:    Reason Eval/Treat Not Completed: Patient at procedure or test/unavailable. Pt off floor for procedure this morning. PT to re-attempt another time/date when medically appropriate.   Dorina HERO. Fairly IV, PT, DPT Physical Therapist- [place]  [facility] 05/12/2024, 11:27 AM

## 2024-05-12 NOTE — Transfer of Care (Signed)
 Immediate Anesthesia Transfer of Care Note  Patient: Brittany Burke  Procedure(s) Performed: CYSTOSCOPY/URETEROSCOPY/HOLMIUM LASER/STENT PLACEMENT (Left: Ureter)  Patient Location: PACU  Anesthesia Type:General  Level of Consciousness: drowsy  Airway & Oxygen Therapy: Patient Spontanous Breathing and Patient connected to face mask oxygen  Post-op Assessment: Report given to RN and Post -op Vital signs reviewed and stable  Post vital signs: Reviewed and stable  Last Vitals:  Vitals Value Taken Time  BP 116/47 05/12/24 13:19  Temp 36.4 C 05/12/24 13:19  Pulse 78 05/12/24 13:22  Resp 20 05/12/24 13:22  SpO2 100 % 05/12/24 13:22  Vitals shown include unfiled device data.  Last Pain:  Vitals:   05/12/24 1319  TempSrc:   PainSc: Asleep         Complications: No notable events documented.

## 2024-05-13 ENCOUNTER — Encounter: Payer: Self-pay | Admitting: Urology

## 2024-05-13 DIAGNOSIS — N201 Calculus of ureter: Secondary | ICD-10-CM | POA: Diagnosis not present

## 2024-05-13 DIAGNOSIS — N3000 Acute cystitis without hematuria: Secondary | ICD-10-CM | POA: Diagnosis not present

## 2024-05-13 DIAGNOSIS — R531 Weakness: Secondary | ICD-10-CM | POA: Diagnosis not present

## 2024-05-13 DIAGNOSIS — N179 Acute kidney failure, unspecified: Secondary | ICD-10-CM

## 2024-05-13 LAB — CBC
HCT: 30.1 % — ABNORMAL LOW (ref 36.0–46.0)
Hemoglobin: 10 g/dL — ABNORMAL LOW (ref 12.0–15.0)
MCH: 27.3 pg (ref 26.0–34.0)
MCHC: 33.2 g/dL (ref 30.0–36.0)
MCV: 82.2 fL (ref 80.0–100.0)
Platelets: 214 K/uL (ref 150–400)
RBC: 3.66 MIL/uL — ABNORMAL LOW (ref 3.87–5.11)
RDW: 16.6 % — ABNORMAL HIGH (ref 11.5–15.5)
WBC: 7.9 K/uL (ref 4.0–10.5)
nRBC: 0 % (ref 0.0–0.2)

## 2024-05-13 LAB — BASIC METABOLIC PANEL WITH GFR
Anion gap: 8 (ref 5–15)
BUN: 10 mg/dL (ref 8–23)
CO2: 28 mmol/L (ref 22–32)
Calcium: 8.8 mg/dL — ABNORMAL LOW (ref 8.9–10.3)
Chloride: 103 mmol/L (ref 98–111)
Creatinine, Ser: 0.61 mg/dL (ref 0.44–1.00)
GFR, Estimated: >60 mL/min (ref >60)
Glucose, Bld: 112 mg/dL — ABNORMAL HIGH (ref 70–99)
Potassium: 3.7 mmol/L (ref 3.5–5.1)
Sodium: 139 mmol/L (ref 135–145)

## 2024-05-13 LAB — URINE CULTURE: Culture: NO GROWTH

## 2024-05-13 MED ORDER — TAMSULOSIN HCL 0.4 MG PO CAPS: 0.4000 mg | ORAL_CAPSULE | Freq: Every day | ORAL | 0 refills | Status: AC

## 2024-05-13 MED ORDER — OXYCODONE-ACETAMINOPHEN 5-325 MG PO TABS: 1.0000 | ORAL_TABLET | Freq: Three times a day (TID) | ORAL | 0 refills | Status: DC | PRN

## 2024-05-13 MED ORDER — OXYCODONE-ACETAMINOPHEN 5-325 MG PO TABS: 1.0000 | ORAL_TABLET | Freq: Three times a day (TID) | ORAL | 0 refills | Status: AC | PRN

## 2024-05-13 MED ORDER — CEFADROXIL 500 MG PO CAPS: 1000.0000 mg | ORAL_CAPSULE | Freq: Two times a day (BID) | ORAL | 0 refills | Status: AC

## 2024-05-13 MED ORDER — CEFADROXIL 500 MG PO CAPS
1000.0000 mg | ORAL_CAPSULE | Freq: Two times a day (BID) | ORAL | Status: DC
  Administered 2024-05-13: 1000 mg via ORAL
  Filled 2024-05-13: qty 2

## 2024-05-13 NOTE — Progress Notes (Signed)
 Physical Therapy Re-Evaluation Patient Details Name: NADALYN DERINGER MRN: [id number] DOB: [birth year] Today's Date: 05/13/2024   History of Present Illness BREDA BOND is a 78 y.o. female with medical history significant of hypertension, hyperlipidemia, depression, COPD who presents to the emergency department due to frequent falls.  Patient states that she fell off her malfunctioning recliner hurting her upper back.  She was reported to have increased confusion today.  Patient denies burning sensation on urination or increased urinary frequency. Pt is now s/p    PT Comments  Pt received in bathroom for hand off from OT.  Pt requiring modA+1 and heavy UE use on railing to stand to RW due to LBP. Pt stating she plans to return home but does not have the understanding of her current functional capacity and need for physical assist for transfers. Pt does well ambulating household distances with RW and is steady however concerns currently with her ability to stand form recliner and BSC at home living alone with current needed physical assist. Only has aides that come 2x/week for brief periods of time. PT with poor insight into falls risk and pain deficits. Reliant on min-modA+1 at LE's to return to supine. D/c recs remain appropriate.    If plan is discharge home, recommend the following: A little help with walking and/or transfers;A little help with bathing/dressing/bathroom;Assist for transportation;Help with stairs or ramp for entrance   Can travel by private vehicle     Yes  Equipment Recommendations  Other (comment) (TBD by next venue of care.)    Recommendations for Other Services       Precautions / Restrictions Precautions Precautions: Fall Recall of Precautions/Restrictions: Impaired Restrictions Weight Bearing Restrictions Per Provider Order: No     Mobility  Bed Mobility Overal bed mobility: Needs Assistance Bed Mobility: Sit to Supine       Sit to supine: Mod assist    General bed mobility comments: at LE's Patient Response: Cooperative  Transfers Overall transfer level: Needs assistance Equipment used: Rolling walker (2 wheels) Transfers: Sit to/from Stand Sit to Stand: Min assist, +2 physical assistance, Mod assist           General transfer comment: minA+2 to modA+1    Ambulation/Gait Ambulation/Gait assistance: Contact guard assist Gait Distance (Feet): 40 Feet   Gait Pattern/deviations: Step-through pattern, Trunk flexed       General Gait Details: excellent use of RW once standing.   Stairs             Wheelchair Mobility     Tilt Bed Tilt Bed Patient Response: Cooperative  Modified Rankin (Stroke Patients Only)       Balance Overall balance assessment: Needs assistance Sitting-balance support: No upper extremity supported, Feet supported Sitting balance-Leahy Scale: Fair     Standing balance support: Bilateral upper extremity supported, During functional activity, Reliant on assistive device for balance Standing balance-Leahy Scale: Poor Standing balance comment: Reliant on RW for support                            Communication Communication Communication: Impaired Factors Affecting Communication: Hearing impaired  Cognition Arousal: Alert Behavior During Therapy: WFL for tasks assessed/performed   PT - Cognitive impairments: No apparent impairments                       PT - Cognition Comments: talkative, limited insight into deficits. Following commands: Intact  Cueing Cueing Techniques: Verbal cues  Exercises      General Comments General comments (skin integrity, edema, etc.): Desat to mid to low 80's with gait. Questionable pleth readings. However > 90% with seated rest.      Pertinent Vitals/Pain Pain Assessment Pain Assessment: Faces Faces Pain Scale: Hurts whole lot Pain Location: back Pain Descriptors / Indicators: Constant, Sharp, Grimacing, Crying Pain  Intervention(s): Limited activity within patient's tolerance, Monitored during session, Repositioned    Home Living                          Prior Function            PT Goals (current goals can now be found in the care plan section) Acute Rehab PT Goals Patient Stated Goal: to improve her lower back pain PT Goal Formulation: With patient Time For Goal Achievement: 05/23/24 Potential to Achieve Goals: Fair Progress towards PT goals: Not progressing toward goals - comment    Frequency    Min 2X/week      PT Plan      Co-evaluation              AM-PAC PT 6 Clicks Mobility   Outcome Measure  Help needed turning from your back to your side while in a flat bed without using bedrails?: A Lot Help needed moving from lying on your back to sitting on the side of a flat bed without using bedrails?: A Lot Help needed moving to and from a bed to a chair (including a wheelchair)?: A Lot Help needed standing up from a chair using your arms (e.g., wheelchair or bedside chair)?: A Lot Help needed to walk in hospital room?: A Little Help needed climbing 3-5 steps with a railing? : A Lot 6 Click Score: 13    End of Session Equipment Utilized During Treatment: Gait belt Activity Tolerance: Patient tolerated treatment well;Patient limited by pain Patient left: in bed;with call bell/phone within reach Nurse Communication: Mobility status PT Visit Diagnosis: Muscle weakness (generalized) (M62.81);History of falling (Z91.81);Pain Pain - part of body:  (lumbar spine)     Time: 9074-9063 PT Time Calculation (min) (ACUTE ONLY): 11 min  Charges:      PT General Charges $$ ACUTE PT VISIT: 1 Visit                     Dorina HERO. Fairly IV, PT, DPT Physical Therapist- [place]  [facility] 05/13/2024, 10:03 AM

## 2024-05-13 NOTE — Plan of Care (Signed)
°  Problem: Education: Goal: Knowledge of General Education information will improve Description: Including pain rating scale, medication(s)/side effects and non-pharmacologic comfort measures Outcome: Progressing   Problem: Health Behavior/Discharge Planning: Goal: Ability to manage health-related needs will improve Outcome: Progressing   Problem: Clinical Measurements: Goal: Ability to maintain clinical measurements within normal limits will improve Outcome: Progressing Goal: Will remain free from infection Outcome: Progressing Goal: Diagnostic test results will improve Outcome: Progressing Goal: Respiratory complications will improve Outcome: Progressing Goal: Cardiovascular complication will be avoided Outcome: Progressing   Problem: Coping: Goal: Level of anxiety will decrease Outcome: Progressing   Problem: Nutrition: Goal: Adequate nutrition will be maintained Outcome: Progressing   Problem: Pain Managment: Goal: General experience of comfort will improve and/or be controlled Outcome: Progressing   Problem: Safety: Goal: Ability to remain free from injury will improve Outcome: Progressing   Problem: Skin Integrity: Goal: Risk for impaired skin integrity will decrease Outcome: Progressing   Problem: Urinary Elimination: Goal: Signs and symptoms of infection will decrease Outcome: Progressing

## 2024-05-13 NOTE — Progress Notes (Signed)
 Occupational Therapy Re-evaluation  Patient Details Name: Brittany Burke MRN: [id number] DOB: May 10, 1946 Today's Date: 05/13/2024   History of present illness Brittany Burke is a 78 y.o. female with medical history significant of hypertension, hyperlipidemia, depression, COPD who presents to the emergency department due to frequent falls.  Patient states that she fell off her malfunctioning recliner hurting her upper back.  She was reported to have increased confusion today.  Patient denies burning sensation on urination or increased urinary frequency. Pt is now s/p   OT comments  Chart reviewed to date, pt greeted semi supine in bed, agreeable to OT re-evaluation. Pt continues to perform ADL/functional mobility below PLOF. She requires MIN-MOD A for bed mobility STS with MIN A +2, MOD A +1 with RW. She amb approx 44' with RW with CGA, supervision required for toileting. Spo2 ?80s on RA with mobility attempts but ?pleth despite multiple pulse ox readings. Spo2 >90% on RA at rest. Team notified of findings. Pt is left as received, all needs met. OT will follow.       If plan is discharge home, recommend the following:  A little help with walking and/or transfers;A lot of help with bathing/dressing/bathroom;Assistance with cooking/housework;Supervision due to cognitive status;Assist for transportation;Direct supervision/assist for medications management;Help with stairs or ramp for entrance   Equipment Recommendations  BSC/3in1;Other (comment) (2WW)    Recommendations for Other Services      Precautions / Restrictions Precautions Precautions: Fall Recall of Precautions/Restrictions: Impaired Restrictions Weight Bearing Restrictions Per Provider Order: No       Mobility Bed Mobility Overal bed mobility: Needs Assistance Bed Mobility: Supine to Sit, Sit to Supine     Supine to sit: Min assist, HOB elevated, Used rails Sit to supine: Min assist, HOB elevated, Used rails         Transfers Overall transfer level: Needs assistance Equipment used: Rolling walker (2 wheels) Transfers: Sit to/from Stand             General transfer comment: MIN A +2 with RW, MOD A +1 with RW     Balance Overall balance assessment: Needs assistance Sitting-balance support: No upper extremity supported, Feet supported Sitting balance-Leahy Scale: Fair     Standing balance support: Bilateral upper extremity supported, During functional activity, Reliant on assistive device for balance Standing balance-Leahy Scale: Poor                             ADL either performed or assessed with clinical judgement   ADL Overall ADL's : Needs assistance/impaired Eating/Feeding: Set up   Grooming: Set up               Lower Body Dressing: Maximal assistance   Toilet Transfer: Minimal assistance;Moderate assistance;Regular Toilet;Ambulation;Grab bars Toilet Transfer Details (indicate cue type and reason): step by step verbal cues for technique/body mechanics +1 for MOD A for STS off toilet Toileting- Clothing Manipulation and Hygiene: Supervision/safety;Sitting/lateral lean       Functional mobility during ADLs: Contact guard assist;Rolling walker (2 wheels) (approx 15' two attempts)      Extremity/Trunk Assessment Upper Extremity Assessment Upper Extremity Assessment: Generalized weakness   Lower Extremity Assessment Lower Extremity Assessment: Generalized weakness        Vision Patient Visual Report: No change from baseline     Perception     Praxis     Communication Communication Communication: Impaired Factors Affecting Communication: Hearing impaired   Cognition  Behavior During Therapy: WFL for tasks assessed/performed Cognition: History of cognitive impairments       Memory impairment (select all impairments): Declarative long-term memory Attention impairment (select first level of impairment): Selective attention Executive  functioning impairment (select all impairments): Problem solving, Reasoning OT - Cognition Comments: tangential at times , redirectable on this date                 Following commands: Intact        Cueing   Cueing Techniques: Verbal cues  Exercises Other Exercises Other Exercises: edu re role of OT, role of rehab, discharge recommendations    Shoulder Instructions       General Comments Desat to mid to low 80's with gait. Questionable pleth readings. However > 90% with seated rest.    Pertinent Vitals/ Pain       Pain Assessment Pain Assessment: Faces Faces Pain Scale: Hurts whole lot Pain Location: back Pain Descriptors / Indicators: Constant, Sharp, Grimacing, Crying Pain Intervention(s): Monitored during session, Repositioned, Limited activity within patient's tolerance  Home Living                                          Prior Functioning/Environment              Frequency  Min 2X/week        Progress Toward Goals  OT Goals(current goals can now be found in the care plan section)  Progress towards OT goals: Progressing toward goals  Acute Rehab OT Goals Patient Stated Goal: go home OT Goal Formulation: With patient Time For Goal Achievement: 05/27/24 Potential to Achieve Goals: Good  Plan      Co-evaluation                 AM-PAC OT 6 Clicks Daily Activity     Outcome Measure   Help from another person eating meals?: None Help from another person taking care of personal grooming?: None Help from another person toileting, which includes using toliet, bedpan, or urinal?: A Little Help from another person bathing (including washing, rinsing, drying)?: A Little Help from another person to put on and taking off regular upper body clothing?: None Help from another person to put on and taking off regular lower body clothing?: A Lot 6 Click Score: 20    End of Session Equipment Utilized During Treatment: Rolling  walker (2 wheels);Gait belt  OT Visit Diagnosis: Unsteadiness on feet (R26.81);Other abnormalities of gait and mobility (R26.89);Repeated falls (R29.6);Muscle weakness (generalized) (M62.81);History of falling (Z91.81)   Activity Tolerance Patient tolerated treatment well   Patient Left in bed;with call bell/phone within reach;with bed alarm set   Nurse Communication Mobility status        Time: 9085-9074 OT Time Calculation (min): 11 min  Charges: OT General Charges $OT Visit: 1 Visit OT Evaluation $OT Re-eval: 1 Re-eval OT Treatments $Self Care/Home Management : 8-22 mins  Therisa Sheffield, OTD OTR/L  05/13/2024, 10:02 AM

## 2024-05-13 NOTE — TOC Transition Note (Signed)
 Transition of Care [facility]) - Discharge Note   Patient Details  Name: NEOMIA HERBEL MRN: [id number] Date of Birth: 1946/03/18  Transition of [facility]) CM/SW Contact:  Corean ONEIDA Haddock, RN Phone Number: 05/13/2024, 11:31 AM   Clinical Narrative:     Patient and Sonny NP from [facility] have discussed and the plan will be for respite at [facility].    Patient will DC to: [facility]  Anticipated DC date: 05/13/2024  Family notified:patient to notify daughter  Transport by: [facility]  Per MD patient ready for DC to . RN, patient, and facility notified of DC. Discharge Summary sent to facility. RN given number for report. DC packet on chart.   TOC signing off.         Patient Goals and CMS Choice            Discharge Placement                       Discharge Plan and Services Additional resources added to the After Visit Summary for                                       Social Drivers of Health (SDOH) Interventions SDOH Screenings   Food Insecurity: Patient Unable To Answer (05/09/2024)  Housing: Unknown (05/09/2024)  Transportation Needs: Patient Unable To Answer (05/09/2024)  Utilities: Patient Unable To Answer (05/09/2024)  Social Connections: Patient Unable To Answer (05/09/2024)  Tobacco Use: High Risk (05/08/2024)     Readmission Risk Interventions     No data to display

## 2024-05-13 NOTE — Progress Notes (Signed)
 Report called to [facility] for patient going to room 113A. Patient will be leaving with [facility] as transportation to the facility.

## 2024-05-13 NOTE — Progress Notes (Signed)
 98% oxygen on room air

## 2024-05-13 NOTE — Discharge Summary (Signed)
 Physician Discharge Summary   Patient: Brittany Burke MRN: [id number] DOB: 1946/02/03  Admit date:     05/08/2024  Discharge date: 05/13/2024  Discharge Physician: Leita Blanch   PCP: Lionell Sonny JULIANNA, NP   Recommendations at discharge:    F/u Dr Twylla in 7-10 days for stent removal F/u PCP in 1-2 weeks  Discharge Diagnoses: Principal Problem:   UTI (urinary tract infection)  Brittany Burke is a 78 y.o. female with a PMH significant for  hypertension, hyperlipidemia, depression, COPD who presents to the emergency department due to frequent falls.    ED course: hemodynamically stable.  CBC was normal and BMP was significant for potassium 3.4, bicarb 21, creatinine 1.05 (baseline creatinine at 0.5).  Urinalysis was suspicious for UTI. CT urogram showed obstructive 5 millimeter left ureteropelvic junction stone. Urologist (Dr. Watt) consulted.    Patient was admitted to medicine service for further workup and management of acute cystitis, AKI, nephrolithiasis as outlined in detail below.   05/12/2024 -underwent cystoscopy/ureteroscopy/ Holmium laser and stent placement on the left side 05/13/24--overall doing well. No fever. Some abd cramps. Good UOP  Acute cystitis- Urine culture unfortunately shows multiple species Continue IV ceftriaxone --change to oral abxs   Ureterolithiasis- CT renal stone: Obstructive 5 mm left ureteropelvic junction stone. - urology following, appreciate your care - 12/11 - underwent cystoscopy, Left ureteroscopy with lithotripsy and stent placement on the left side 12/11 --ok from Urology standpoint for d/c f/u in 7-10 days for stent removal   Acute kidney injury- resolved S/p IV fluids   Elevated liver enzymes- improving. Dehydration?   Hypokalemia - resolved - Mag normal eeded    Obesity class I (BMI 32.28) Diet and lifestyle modification   Chronic back pain Continue oxycodone  as needed   Essential hypertension -on hold till SBP >120    Mixed hyperlipidemia Continue atorvastatin   COPD (not in acute exacerbation) Continue albuterol  as needed   Depression Continue Zoloft    Constipation Add bowel regimen   Incidental findings - Simple appearing cyst of the left ovary, follow-up ultrasound in 3-6 months - Probable small calcified meningioma in the right middle cranial fossa   -- PT/OT eval rec SNF--per [facility] pt will go to [facility] for respite care --d/w pt d/c plan and she is agreeable       Pain control - [place]  Controlled Substance Reporting System database was reviewed. and patient was instructed, not to drive, operate heavy machinery, perform activities at heights, swimming or participation in water activities or provide baby-sitting services while on Pain, Sleep and Anxiety Medications; until their outpatient Physician has advised to do so again. Also recommended to not to take more than prescribed Pain, Sleep and Anxiety Medications.  Consultants: urology Procedures performed: left ureteral stent placement Disposition: Rehabilitation facility Diet recommendation:   DISCHARGE MEDICATION: Allergies as of 05/13/2024   No Known Allergies      Medication List     PAUSE taking these medications    amLODipine  2.5 MG tablet Wait to take this until your doctor or other care provider tells you to start again. Until BP >120 Commonly known as: NORVASC  Take 2.5 mg by mouth every morning.       STOP taking these medications    HYDROcodone -acetaminophen  5-325 MG tablet Commonly known as: NORCO/VICODIN   oxyCODONE  5 MG immediate release tablet Commonly known as: Oxy IR/ROXICODONE    OZEMPIC (0.25 OR 0.5 MG/DOSE) [place]       TAKE these medications    Acetaminophen   Extra Strength 500 MG Tabs Take 2 tablets by mouth every 8 (eight) hours as needed.   albuterol  (2.5 MG/3ML) 0.083% nebulizer solution Commonly known as: PROVENTIL  Take 2.5 mg by nebulization every 4 (four) hours as needed for wheezing  or shortness of breath (cough).   ascorbic acid 500 MG tablet Commonly known as: VITAMIN C Take 500 mg by mouth 2 (two) times daily.   aspirin  81 MG chewable tablet Chew 1 tablet (81 mg total) by mouth 2 (two) times daily.   cefadroxil 500 MG capsule Commonly known as: DURICEF Take 2 capsules (1,000 mg total) by mouth 2 (two) times daily for 2 days.   celecoxib  100 MG capsule Commonly known as: CELEBREX  Take 100 mg by mouth 2 (two) times daily.   cholecalciferol 25 MCG (1000 UNIT) tablet Commonly known as: VITAMIN D3 Take 1,000 Units by mouth daily.   cyanocobalamin 1000 MCG tablet Commonly known as: VITAMIN B12 Take 1,000 mcg by mouth daily.   DULoxetine  60 MG capsule Commonly known as: CYMBALTA  Take 120 mg by mouth every morning.   eszopiclone 1 MG Tabs tablet Commonly known as: LUNESTA Take 1 mg by mouth at bedtime as needed for sleep. Take immediately before bedtime   lidocaine  5 % Commonly known as: LIDODERM  Place 1 patch onto the skin as needed. Remove & Discard patch within 12 hours or as directed by MD   Mounjaro 12.5 MG/0.5ML Pen Generic drug: tirzepatide Inject 12.5 mg into the skin every Thursday.   nortriptyline  25 MG capsule Commonly known as: PAMELOR  Take 25-50 mg by mouth 2 (two) times daily. Take 25 mg (one capsule) by mouth every morning and 50 mg (two capsules) at bedtime   oxyCODONE -acetaminophen  5-325 MG tablet Commonly known as: PERCOCET/ROXICET Take 1 tablet by mouth every 8 (eight) hours as needed for severe pain (pain score 7-10).   Pataday  0.2 % Soln Generic drug: Olopatadine  HCl Place 1 drop into both eyes daily.   pravastatin  40 MG tablet Commonly known as: PRAVACHOL  Take 40 mg by mouth at bedtime.   pregabalin  150 MG capsule Commonly known as: LYRICA  Take 150 mg by mouth 2 (two) times daily.   PreserVision AREDS 2 Caps Take 2 capsules by mouth daily.   One-A-Day Womens tablet Take 1 tablet by mouth daily.   sertraline  100  MG tablet Commonly known as: ZOLOFT  Take 100 mg by mouth daily.   tamsulosin  0.4 MG Caps capsule Commonly known as: FLOMAX  Take 1 capsule (0.4 mg total) by mouth daily.   triamcinolone cream 0.1 % Commonly known as: KENALOG Apply 1 Application topically 3 (three) times daily.        Follow-up Information     Lionell Sonny FALCON, NP. Schedule an appointment as soon as possible for a visit in 1 week(s).   Specialty: Family Medicine Contact information: [address] [place] [place] [address] [phone]         Twylla Glendia BROCKS, MD. Schedule an appointment as soon as possible for a visit in 1 week(s).   Specialty: Urology Why: f/u ureteral stent placement Contact information: [address] [place] [address] [phone]                Discharge Exam: Fredricka Weights   05/08/24 1334  Weight: 90.7 kg   General: awake, alert, NAD while resting. Significant pain when moving Respiratory: normal respiratory effort. Cardiovascular: extremities well perfused, quick capillary refill, normal S1/S2, RRR, no JVD, murmurs Gastrointestinal: soft, NT, ND Nervous:  A&O x3. no gross focal neurologic deficits, normal speech Extremities: moves all equally, no edema, normal tone  Condition at discharge: fair  The results of significant diagnostics from this hospitalization (including imaging, microbiology, ancillary and laboratory) are listed below for reference.   Imaging Studies: DG OR UROLOGY CYSTO IMAGE ([facility] ONLY) Result Date: 05/12/2024 There is no interpretation for this exam.  This order is for images obtained during a surgical procedure.  Please See Surgeries Tab for more information regarding the procedure.   CT Renal Stone Study Result Date: 05/08/2024 EXAM: CT UROGRAM 05/08/2024 09:26:00 PM TECHNIQUE: CT of the abdomen and pelvis was performed before and after the administration of intravenous contrast as per CT urogram protocol. Multiplanar  reformatted images as well as MIP urogram images are provided for review. Automated exposure control, iterative reconstruction, and/or weight based adjustment of the mA/kV was utilized to reduce the radiation dose to as low as reasonably achievable. COMPARISON: None available. CLINICAL HISTORY: Abdominal/flank pain, stone suspected. FINDINGS: LOWER CHEST: Right middle lobe ground-glass airspace opacities (4:1). LIVER: The liver is unremarkable. GALLBLADDER AND BILE DUCTS: Gallbladder is unremarkable. No biliary ductal dilatation. SPLEEN: No acute abnormality. PANCREAS: No acute abnormality. ADRENAL GLANDS: No acute abnormality. KIDNEYS, URETERS AND BLADDER: Left hydronephrosis with associated 5 mm calculus at the left ureteropelvic junction. No right nephrolithiasis. No right hydronephrosis. No ureterolithiasis on the right. No hydroureter bilaterally. A fluid density lesion of the right kidney likely represents an infrarenal cyst. Simple renal cysts do not require additional follow-up unless clinically indicated due to signs/symptoms. Urinary bladder is unremarkable. No perinephric or periureteral stranding. GI AND BOWEL: Stomach demonstrates no acute abnormality. No small or large bowel thickening or dilatation. The appendix is not definitely identified with no inflammatory changes in the right lower quadrant to suggest acute appendicitis. Colonic diverticulosis. There is no bowel obstruction. PERITONEUM AND RETROPERITONEUM: No ascites. No free air. VASCULATURE: Aorta is normal in caliber. Severe atherosclerotic plaque of the aorta. LYMPH NODES: No lymphadenopathy. REPRODUCTIVE ORGANS: Thinly septated 7.2 x 4.9 cm cystic lesion of the left ovary. BONES AND SOFT TISSUES: Total right hip arthroplasty. Severe degenerative change of the spine. No focal soft tissue abnormality. IMPRESSION: 1. Obstructive 5 mm left ureteropelvic junction stone. 2. Thinly septated 7.2 x 4.9 cm simple-appearing cyst of the left ovary,  recommend follow-up ultrasound in 36 months. 3. Right middle lobe ground-glass airspace opacities. Finding may represent infection/inflammation. Recommend repeat CT at 3 months to evaluate for complete resolution. Electronically signed by: Morgane Naveau MD 05/08/2024 09:35 PM EST RP Workstation: [id number]   CT Cervical Spine Wo Contrast Result Date: 05/08/2024 CLINICAL DATA:  Neck trauma, confusion, multiple falls EXAM: CT CERVICAL SPINE WITHOUT CONTRAST TECHNIQUE: Multidetector CT imaging of the cervical spine was performed without intravenous contrast. Multiplanar CT image reconstructions were also generated. RADIATION DOSE REDUCTION: This exam was performed according to the departmental dose-optimization program which includes automated exposure control, adjustment of the mA and/or kV according to patient size and/or use of iterative reconstruction technique. COMPARISON:  None Available. FINDINGS: Alignment: Alignment is anatomic. Skull base and vertebrae: No acute fracture. No primary bone lesion or focal pathologic process. Soft tissues and spinal canal: No prevertebral fluid or swelling. No visible canal hematoma. Disc levels: Moderate C5-6 and C6-7 spondylosis. Mild diffuse facet hypertrophy greatest from C2-3 through C4-5. Upper chest: Airway is patent.  Lung apices are clear. Other: Reconstructed images demonstrate no additional findings. IMPRESSION: 1. No acute cervical spine fracture. Electronically Signed   By: Ozell  Delores M.D.   On: 05/08/2024 14:45   CT Head Wo Contrast Result Date: 05/08/2024 EXAM: CT HEAD WITHOUT CONTRAST 05/08/2024 02:28:00 PM TECHNIQUE: CT of the head was performed without the administration of intravenous contrast. Automated exposure control, iterative reconstruction, and/or weight based adjustment of the mA/kV was utilized to reduce the radiation dose to as low as reasonably achievable. COMPARISON: CT head without contrast 10/08/2012. CLINICAL HISTORY: Head trauma,  GCS=15, no focal neuro findings (low risk) (Ped 0-17y). Increased confusion. FINDINGS: BRAIN AND VENTRICLES: No acute hemorrhage. No evidence of acute infarct. No hydrocephalus. No extra-axial collection. No mass effect or midline shift. Patchy white matter hypodensities, compatible with chronic microvascular ischemic disease. Probable small calcified meningioma in right middle cranial fossa measures 9 mm. ORBITS: Bilateral lens replacements are noted. The globes and orbits are otherwise within normal limits. SINUSES: No acute abnormality. SOFT TISSUES AND SKULL: No acute soft tissue abnormality. No skull fracture. IMPRESSION: 1. No acute intracranial abnormality related to head trauma. 2. Probable small calcified meningioma in the right middle cranial fossa measuring 9 mm. 3. Patchy white matter hypodensities compatible with chronic microvascular ischemic disease. Electronically signed by: Lonni Necessary MD 05/08/2024 02:43 PM EST RP Workstation: [id number]   DG Chest 2 View Result Date: 05/08/2024 CLINICAL DATA:  Chronic back pain, fell, confusion EXAM: CHEST - 2 VIEW COMPARISON:  06/02/2013 FINDINGS: Frontal and lateral views of the chest demonstrate an unremarkable cardiac silhouette. No acute airspace disease, effusion, or pneumothorax. There are no acute displaced fractures. Severe degenerative changes of the shoulders, left greater than right. IMPRESSION: 1. No acute intrathoracic process. Electronically Signed   By: Ozell Delores M.D.   On: 05/08/2024 14:15    Microbiology: Results for orders placed or performed during the hospital encounter of 05/08/24  Resp panel by RT-PCR (RSV, Flu A&B, Covid) Anterior Nasal Swab     Status: None   Collection Time: 05/08/24  6:00 PM   Specimen: Anterior Nasal Swab  Result Value Ref Range Status   SARS Coronavirus 2 by RT PCR NEGATIVE NEGATIVE Final    Comment: (NOTE) SARS-CoV-2 target nucleic acids are NOT DETECTED.  The SARS-CoV-2 RNA is generally  detectable in upper respiratory specimens during the acute phase of infection. The lowest concentration of SARS-CoV-2 viral copies this assay can detect is 138 copies/mL. A negative result does not preclude SARS-Cov-2 infection and should not be used as the sole basis for treatment or other patient management decisions. A negative result may occur with  improper specimen collection/handling, submission of specimen other than nasopharyngeal swab, presence of viral mutation(s) within the areas targeted by this assay, and inadequate number of viral copies(<138 copies/mL). A negative result must be combined with clinical observations, patient history, and epidemiological information. The expected result is Negative.  Fact Sheet for Patients:  bloggercourse.com  Fact Sheet for Healthcare Providers:  seriousbroker.it  This test is no t yet approved or cleared by the [place]  FDA and  has been authorized for detection and/or diagnosis of SARS-CoV-2 by FDA under an Emergency Use Authorization (EUA). This EUA will remain  in effect (meaning this test can be used) for the duration of the COVID-19 declaration under Section 564(b)(1) of the Act, 21 U.S.C.section 360bbb-3(b)(1), unless the authorization is terminated  or revoked sooner.       Influenza A by PCR NEGATIVE NEGATIVE Final   Influenza B by PCR NEGATIVE NEGATIVE Final    Comment: (NOTE) The Xpert Xpress SARS-CoV-2/FLU/RSV plus assay is intended as an  aid in the diagnosis of influenza from Nasopharyngeal swab specimens and should not be used as a sole basis for treatment. Nasal washings and aspirates are unacceptable for Xpert Xpress SARS-CoV-2/FLU/RSV testing.  Fact Sheet for Patients: bloggercourse.com  Fact Sheet for Healthcare Providers: seriousbroker.it  This test is not yet approved or cleared by the [place]  FDA  and has been authorized for detection and/or diagnosis of SARS-CoV-2 by FDA under an Emergency Use Authorization (EUA). This EUA will remain in effect (meaning this test can be used) for the duration of the COVID-19 declaration under Section 564(b)(1) of the Act, 21 U.S.C. section 360bbb-3(b)(1), unless the authorization is terminated or revoked.     Resp Syncytial Virus by PCR NEGATIVE NEGATIVE Final    Comment: (NOTE) Fact Sheet for Patients: bloggercourse.com  Fact Sheet for Healthcare Providers: seriousbroker.it  This test is not yet approved or cleared by the [place]  FDA and has been authorized for detection and/or diagnosis of SARS-CoV-2 by FDA under an Emergency Use Authorization (EUA). This EUA will remain in effect (meaning this test can be used) for the duration of the COVID-19 declaration under Section 564(b)(1) of the Act, 21 U.S.C. section 360bbb-3(b)(1), unless the authorization is terminated or revoked.  Performed at [facility], [address] [place], [place] [address]   Urine Culture     Status: Abnormal   Collection Time: 05/08/24  6:07 PM   Specimen: Urine, Random  Result Value Ref Range Status   Specimen Description   Final    URINE, RANDOM Performed at [facility], [address] [place], [place] [address]    Special Requests   Final    NONE Reflexed from [id number] Performed at [facility], [address] [place], [place] [address]    Culture MULTIPLE SPECIES PRESENT, SUGGEST RECOLLECTION (A)  Final   Report Status 05/10/2024 FINAL  Final    Labs: CBC: Recent Labs  Lab 05/08/24 1339 05/09/24 0351 05/12/24 0440 05/13/24 0526  WBC 9.7 9.5 7.7 7.9  NEUTROABS 8.7*  --   --   --   HGB 12.3 10.8* 10.8* 10.0*  HCT 37.1 33.2* 32.0* 30.1*  MCV 82.3 84.1 81.4 82.2  PLT 157 137* 201 214   Basic Metabolic Panel: Recent Labs  Lab 05/08/24 1339 05/09/24 0351  05/11/24 0417 05/12/24 0440 05/13/24 0526  NA 136 138 134* 137 139  K 3.4* 3.6 3.1* 4.1 3.7  CL 98 101 98 101 103  CO2 21* 19* 24 22 28   GLUCOSE 93 82 89 92 112*  BUN 19 21 15 11 10   CREATININE 1.05* 1.04* 0.89 0.70 0.61  CALCIUM 9.4 8.7* 8.4* 8.7* 8.8*  MG  --  2.0 2.0 2.1  --   PHOS  --  2.4*  --   --   --    Liver Function Tests: Recent Labs  Lab 05/08/24 1339 05/09/24 0351 05/12/24 0440  AST 149* 103* 33  ALT 67* 57* 35  ALKPHOS 162* 133* 133*  BILITOT 0.6 0.4 0.4  PROT 7.1 6.1* 6.0*  ALBUMIN 3.6 3.1* 2.8*    Discharge time spent: greater than 30 minutes.  Signed: Leita Blanch, MD [facility] 05/13/2024

## 2024-05-13 NOTE — Plan of Care (Signed)
  Problem: Education: Goal: Knowledge of General Education information will improve Description: Including pain rating scale, medication(s)/side effects and non-pharmacologic comfort measures Outcome: Adequate for Discharge   Problem: Health Behavior/Discharge Planning: Goal: Ability to manage health-related needs will improve Outcome: Adequate for Discharge   Problem: Clinical Measurements: Goal: Ability to maintain clinical measurements within normal limits will improve Outcome: Adequate for Discharge Goal: Will remain free from infection Outcome: Adequate for Discharge Goal: Diagnostic test results will improve Outcome: Adequate for Discharge Goal: Respiratory complications will improve Outcome: Adequate for Discharge Goal: Cardiovascular complication will be avoided Outcome: Adequate for Discharge   Problem: Activity: Goal: Risk for activity intolerance will decrease Outcome: Adequate for Discharge   Problem: Nutrition: Goal: Adequate nutrition will be maintained Outcome: Adequate for Discharge   Problem: Coping: Goal: Level of anxiety will decrease Outcome: Adequate for Discharge   Problem: Elimination: Goal: Will not experience complications related to bowel motility Outcome: Adequate for Discharge Goal: Will not experience complications related to urinary retention Outcome: Adequate for Discharge   Problem: Pain Managment: Goal: General experience of comfort will improve and/or be controlled Outcome: Adequate for Discharge   Problem: Safety: Goal: Ability to remain free from injury will improve Outcome: Adequate for Discharge   Problem: Skin Integrity: Goal: Risk for impaired skin integrity will decrease Outcome: Adequate for Discharge   Problem: Urinary Elimination: Goal: Signs and symptoms of infection will decrease Outcome: Adequate for Discharge

## 2024-05-20 ENCOUNTER — Encounter: Admitting: Urology

## 2024-05-23 ENCOUNTER — Encounter: Payer: Self-pay | Admitting: Urology

## 2024-06-10 ENCOUNTER — Encounter: Admitting: Urology

## 2024-06-14 ENCOUNTER — Ambulatory Visit: Admitting: Urology

## 2024-06-14 ENCOUNTER — Encounter: Payer: Self-pay | Admitting: Urology

## 2024-06-14 VITALS — BP 126/76 | HR 77 | Ht 66.0 in | Wt 214.0 lb

## 2024-06-14 DIAGNOSIS — N3000 Acute cystitis without hematuria: Secondary | ICD-10-CM

## 2024-06-14 MED ORDER — SULFAMETHOXAZOLE-TRIMETHOPRIM 800-160 MG PO TABS
1.0000 | ORAL_TABLET | Freq: Once | ORAL | Status: AC
  Administered 2024-06-14: 1 via ORAL

## 2024-06-14 NOTE — Patient Instructions (Signed)
 Schedule renal ultrasound in one month [phone]

## 2024-06-14 NOTE — Progress Notes (Signed)
" ° °  Indications: Patient is 79 y.o., who is s/p ureteroscopic removal of a 5 mm left renal pelvic calculus 05/12/2024.  She had no postoperative problems and no significant stent symptoms.  The patient is presenting today for stent removal.  Procedure:  Flexible Cystoscopy with stent removal (47689)  Timeout was performed and the correct patient, procedure and participants were identified.    Description:  The patient was prepped and draped in the usual sterile fashion. Flexible cystosopy was performed.  Initial visualization was suboptimal.  The cystoscope was removed.  A 33F Robinson catheter was placed and the catheter was irrigated.  The cystoscope was replaced with excellent vision.  The stent was visualized, grasped, and removed intact without difficulty. The patient tolerated the procedure well.  A single dose of oral antibiotics was given.  Complications:  None  Plan:  RUS 1 month Follow-up office visit 3-4 months   Glendia Barba, MD  "

## 2024-06-23 ENCOUNTER — Ambulatory Visit

## 2024-06-27 ENCOUNTER — Other Ambulatory Visit: Payer: Self-pay | Admitting: Family Medicine

## 2024-06-27 DIAGNOSIS — M48062 Spinal stenosis, lumbar region with neurogenic claudication: Secondary | ICD-10-CM

## 2024-06-27 DIAGNOSIS — N83202 Unspecified ovarian cyst, left side: Secondary | ICD-10-CM

## 2024-06-28 ENCOUNTER — Ambulatory Visit

## 2024-07-05 ENCOUNTER — Ambulatory Visit: Admitting: Student in an Organized Health Care Education/Training Program

## 2024-07-11 ENCOUNTER — Ambulatory Visit

## 2024-07-12 ENCOUNTER — Ambulatory Visit

## 2024-07-14 ENCOUNTER — Ambulatory Visit

## 2024-07-28 ENCOUNTER — Ambulatory Visit: Admitting: Student in an Organized Health Care Education/Training Program

## 2024-10-12 ENCOUNTER — Ambulatory Visit: Admitting: Physician Assistant
